# Patient Record
Sex: Female | Born: 1935 | Race: White | Hispanic: No | Marital: Married | Smoking: Former smoker
Health system: Southern US, Community
[De-identification: ages and names within clinical notes are randomized; demographics above are authoritative.]

## PROBLEM LIST (undated history)

## (undated) DIAGNOSIS — C786 Secondary malignant neoplasm of retroperitoneum and peritoneum: Principal | ICD-10-CM

## (undated) DIAGNOSIS — E119 Type 2 diabetes mellitus without complications: Secondary | ICD-10-CM

## (undated) DIAGNOSIS — C801 Malignant (primary) neoplasm, unspecified: Principal | ICD-10-CM

## (undated) DIAGNOSIS — M549 Dorsalgia, unspecified: Secondary | ICD-10-CM

## (undated) DIAGNOSIS — C569 Malignant neoplasm of unspecified ovary: Secondary | ICD-10-CM

## (undated) DIAGNOSIS — F329 Major depressive disorder, single episode, unspecified: Secondary | ICD-10-CM

## (undated) DIAGNOSIS — D649 Anemia, unspecified: Secondary | ICD-10-CM

## (undated) DIAGNOSIS — K219 Gastro-esophageal reflux disease without esophagitis: Secondary | ICD-10-CM

## (undated) DIAGNOSIS — G8929 Other chronic pain: Secondary | ICD-10-CM

## (undated) DIAGNOSIS — I499 Cardiac arrhythmia, unspecified: Secondary | ICD-10-CM

## (undated) DIAGNOSIS — F32A Depression, unspecified: Secondary | ICD-10-CM

## (undated) HISTORY — PX: CHOLECYSTECTOMY: SHX55

## (undated) HISTORY — DX: Anemia, unspecified: D64.9

## (undated) HISTORY — PX: TUBAL LIGATION: SHX77

## (undated) HISTORY — PX: APPENDECTOMY: SHX54

## (undated) HISTORY — DX: Other chronic pain: G89.29

## (undated) HISTORY — DX: Gastro-esophageal reflux disease without esophagitis: K21.9

## (undated) HISTORY — DX: Malignant neoplasm of unspecified ovary: C56.9

## (undated) HISTORY — PX: HEEL SPUR SURGERY: SHX665

## (undated) HISTORY — DX: Dorsalgia, unspecified: M54.9

## (undated) HISTORY — DX: Type 2 diabetes mellitus without complications: E11.9

## (undated) HISTORY — DX: Secondary malignant neoplasm of retroperitoneum and peritoneum: C78.6

## (undated) HISTORY — DX: Major depressive disorder, single episode, unspecified: F32.9

## (undated) HISTORY — DX: Malignant (primary) neoplasm, unspecified: C80.1

## (undated) HISTORY — PX: ABDOMINAL HYSTERECTOMY: SHX81

## (undated) HISTORY — DX: Cardiac arrhythmia, unspecified: I49.9

## (undated) HISTORY — DX: Depression, unspecified: F32.A

---

## 2003-09-27 ENCOUNTER — Other Ambulatory Visit: Payer: Self-pay

## 2004-07-08 ENCOUNTER — Ambulatory Visit: Payer: Self-pay | Admitting: Family Medicine

## 2004-07-22 ENCOUNTER — Ambulatory Visit: Payer: Self-pay

## 2005-03-23 ENCOUNTER — Ambulatory Visit: Payer: Self-pay

## 2005-05-03 ENCOUNTER — Ambulatory Visit: Payer: Self-pay

## 2005-05-24 ENCOUNTER — Emergency Department: Payer: Self-pay | Admitting: Emergency Medicine

## 2005-09-29 ENCOUNTER — Ambulatory Visit: Payer: Self-pay | Admitting: Unknown Physician Specialty

## 2006-01-10 ENCOUNTER — Other Ambulatory Visit: Payer: Self-pay

## 2006-01-10 ENCOUNTER — Ambulatory Visit: Payer: Self-pay | Admitting: Gastroenterology

## 2006-01-17 ENCOUNTER — Ambulatory Visit: Payer: Self-pay | Admitting: Gastroenterology

## 2006-11-29 ENCOUNTER — Ambulatory Visit: Payer: Self-pay | Admitting: Unknown Physician Specialty

## 2007-01-17 ENCOUNTER — Emergency Department: Payer: Self-pay | Admitting: Emergency Medicine

## 2007-01-17 ENCOUNTER — Other Ambulatory Visit: Payer: Self-pay

## 2007-01-31 ENCOUNTER — Ambulatory Visit: Payer: Self-pay | Admitting: Internal Medicine

## 2007-04-02 ENCOUNTER — Ambulatory Visit: Payer: Self-pay | Admitting: Unknown Physician Specialty

## 2007-11-29 ENCOUNTER — Ambulatory Visit: Payer: Self-pay | Admitting: Gastroenterology

## 2008-01-31 ENCOUNTER — Ambulatory Visit: Payer: Self-pay | Admitting: Unknown Physician Specialty

## 2008-06-12 ENCOUNTER — Ambulatory Visit: Payer: Self-pay | Admitting: Unknown Physician Specialty

## 2009-03-10 ENCOUNTER — Ambulatory Visit: Payer: Self-pay | Admitting: Unknown Physician Specialty

## 2009-05-13 ENCOUNTER — Ambulatory Visit: Payer: Self-pay | Admitting: Obstetrics and Gynecology

## 2009-05-18 ENCOUNTER — Ambulatory Visit: Payer: Self-pay | Admitting: Obstetrics and Gynecology

## 2010-12-14 ENCOUNTER — Ambulatory Visit: Payer: Self-pay | Admitting: Unknown Physician Specialty

## 2011-10-11 ENCOUNTER — Emergency Department: Payer: Self-pay | Admitting: Unknown Physician Specialty

## 2011-10-11 LAB — CBC
HGB: 12.2 g/dL (ref 12.0–16.0)
MCH: 32 pg (ref 26.0–34.0)
MCHC: 33.5 g/dL (ref 32.0–36.0)
MCV: 96 fL (ref 80–100)
Platelet: 262 10*3/uL (ref 150–440)
RDW: 12.6 % (ref 11.5–14.5)

## 2011-10-11 LAB — COMPREHENSIVE METABOLIC PANEL
Alkaline Phosphatase: 47 U/L — ABNORMAL LOW (ref 50–136)
Anion Gap: 11 (ref 7–16)
Bilirubin,Total: 0.9 mg/dL (ref 0.2–1.0)
Calcium, Total: 9.8 mg/dL (ref 8.5–10.1)
Chloride: 100 mmol/L (ref 98–107)
Co2: 29 mmol/L (ref 21–32)
Creatinine: 1.76 mg/dL — ABNORMAL HIGH (ref 0.60–1.30)
EGFR (African American): 36 — ABNORMAL LOW
Potassium: 4 mmol/L (ref 3.5–5.1)
Sodium: 140 mmol/L (ref 136–145)
Total Protein: 7.6 g/dL (ref 6.4–8.2)

## 2011-10-11 LAB — URINALYSIS, COMPLETE
Bacteria: NONE SEEN
Bilirubin,UR: NEGATIVE
Glucose,UR: NEGATIVE mg/dL (ref 0–75)
Ketone: NEGATIVE
Leukocyte Esterase: NEGATIVE
Ph: 5 (ref 4.5–8.0)
Protein: NEGATIVE
RBC,UR: 1 /HPF (ref 0–5)
Specific Gravity: 1.011 (ref 1.003–1.030)
Squamous Epithelial: 2

## 2012-03-20 ENCOUNTER — Emergency Department: Payer: Self-pay | Admitting: Emergency Medicine

## 2012-03-20 LAB — COMPREHENSIVE METABOLIC PANEL
Albumin: 4.3 g/dL (ref 3.4–5.0)
Anion Gap: 8 (ref 7–16)
BUN: 27 mg/dL — ABNORMAL HIGH (ref 7–18)
Bilirubin,Total: 0.7 mg/dL (ref 0.2–1.0)
Calcium, Total: 9.8 mg/dL (ref 8.5–10.1)
Chloride: 103 mmol/L (ref 98–107)
Creatinine: 1.74 mg/dL — ABNORMAL HIGH (ref 0.60–1.30)
EGFR (African American): 33 — ABNORMAL LOW
EGFR (Non-African Amer.): 29 — ABNORMAL LOW
Glucose: 70 mg/dL (ref 65–99)
Osmolality: 283 (ref 275–301)
Sodium: 140 mmol/L (ref 136–145)
Total Protein: 7.9 g/dL (ref 6.4–8.2)

## 2012-03-20 LAB — URINALYSIS, COMPLETE
Bilirubin,UR: NEGATIVE
Blood: NEGATIVE
Glucose,UR: NEGATIVE mg/dL (ref 0–75)
Leukocyte Esterase: NEGATIVE
Specific Gravity: 1.021 (ref 1.003–1.030)
WBC UR: 1 /HPF (ref 0–5)

## 2012-03-20 LAB — CBC
HCT: 34.8 % — ABNORMAL LOW (ref 35.0–47.0)
HGB: 12 g/dL (ref 12.0–16.0)
MCH: 32.8 pg (ref 26.0–34.0)
MCV: 95 fL (ref 80–100)
Platelet: 269 10*3/uL (ref 150–440)
RBC: 3.67 10*6/uL — ABNORMAL LOW (ref 3.80–5.20)
WBC: 7 10*3/uL (ref 3.6–11.0)

## 2012-03-22 ENCOUNTER — Emergency Department: Payer: Self-pay | Admitting: Emergency Medicine

## 2012-06-05 ENCOUNTER — Ambulatory Visit: Payer: Self-pay

## 2012-06-21 ENCOUNTER — Ambulatory Visit: Payer: Self-pay | Admitting: Gastroenterology

## 2012-11-13 ENCOUNTER — Ambulatory Visit: Payer: Self-pay | Admitting: Ophthalmology

## 2012-11-26 ENCOUNTER — Ambulatory Visit: Payer: Self-pay | Admitting: Ophthalmology

## 2013-01-04 ENCOUNTER — Ambulatory Visit: Payer: Self-pay | Admitting: Gastroenterology

## 2013-01-22 ENCOUNTER — Other Ambulatory Visit: Payer: Self-pay | Admitting: Gastroenterology

## 2013-01-22 DIAGNOSIS — K469 Unspecified abdominal hernia without obstruction or gangrene: Secondary | ICD-10-CM

## 2013-01-23 ENCOUNTER — Ambulatory Visit: Payer: Self-pay | Admitting: Gastroenterology

## 2013-01-29 ENCOUNTER — Other Ambulatory Visit: Payer: Self-pay

## 2013-04-30 ENCOUNTER — Ambulatory Visit: Payer: Self-pay | Admitting: Oncology

## 2013-04-30 LAB — COMPREHENSIVE METABOLIC PANEL
Alkaline Phosphatase: 119 U/L (ref 50–136)
Bilirubin,Total: 0.5 mg/dL (ref 0.2–1.0)
Co2: 30 mmol/L (ref 21–32)
EGFR (African American): 34 — ABNORMAL LOW
Glucose: 82 mg/dL (ref 65–99)
Potassium: 4 mmol/L (ref 3.5–5.1)
SGOT(AST): 21 U/L (ref 15–37)
SGPT (ALT): 16 U/L (ref 12–78)
Sodium: 139 mmol/L (ref 136–145)
Total Protein: 7.8 g/dL (ref 6.4–8.2)

## 2013-04-30 LAB — CBC CANCER CENTER
Eosinophil #: 0.2 x10 3/mm (ref 0.0–0.7)
Eosinophil %: 3 %
HGB: 10.8 g/dL — ABNORMAL LOW (ref 12.0–16.0)
Lymphocyte %: 14.2 %
MCV: 93 fL (ref 80–100)
Monocyte #: 0.5 x10 3/mm (ref 0.2–0.9)
Neutrophil %: 75.9 %
Platelet: 284 x10 3/mm (ref 150–440)
RBC: 3.32 10*6/uL — ABNORMAL LOW (ref 3.80–5.20)
WBC: 8.1 x10 3/mm (ref 3.6–11.0)

## 2013-05-01 ENCOUNTER — Ambulatory Visit: Payer: Self-pay | Admitting: Gynecologic Oncology

## 2013-05-01 LAB — BASIC METABOLIC PANEL
Anion Gap: 4 — ABNORMAL LOW (ref 7–16)
Chloride: 108 mmol/L — ABNORMAL HIGH (ref 98–107)
Co2: 28 mmol/L (ref 21–32)
Creatinine: 1.84 mg/dL — ABNORMAL HIGH (ref 0.60–1.30)
EGFR (African American): 31 — ABNORMAL LOW
EGFR (Non-African Amer.): 27 — ABNORMAL LOW
Glucose: 118 mg/dL — ABNORMAL HIGH (ref 65–99)
Osmolality: 288 (ref 275–301)
Potassium: 4.3 mmol/L (ref 3.5–5.1)

## 2013-05-01 LAB — CBC
HGB: 9.9 g/dL — ABNORMAL LOW (ref 12.0–16.0)
MCH: 32.5 pg (ref 26.0–34.0)
MCHC: 35 g/dL (ref 32.0–36.0)
Platelet: 260 10*3/uL (ref 150–440)
RBC: 3.06 10*6/uL — ABNORMAL LOW (ref 3.80–5.20)
WBC: 6.7 10*3/uL (ref 3.6–11.0)

## 2013-05-02 ENCOUNTER — Ambulatory Visit: Payer: Self-pay | Admitting: Anesthesiology

## 2013-05-02 ENCOUNTER — Ambulatory Visit: Payer: Self-pay | Admitting: Gastroenterology

## 2013-05-07 ENCOUNTER — Inpatient Hospital Stay: Payer: Self-pay | Admitting: Surgery

## 2013-05-08 LAB — PATHOLOGY REPORT

## 2013-05-13 LAB — COMPREHENSIVE METABOLIC PANEL
Alkaline Phosphatase: 98 U/L (ref 50–136)
Anion Gap: 9 (ref 7–16)
BUN: 20 mg/dL — ABNORMAL HIGH (ref 7–18)
Bilirubin,Total: 0.4 mg/dL (ref 0.2–1.0)
Chloride: 105 mmol/L (ref 98–107)
Creatinine: 1.52 mg/dL — ABNORMAL HIGH (ref 0.60–1.30)
EGFR (African American): 39 — ABNORMAL LOW
EGFR (Non-African Amer.): 33 — ABNORMAL LOW
Osmolality: 286 (ref 275–301)
Potassium: 3.7 mmol/L (ref 3.5–5.1)
SGPT (ALT): 15 U/L (ref 12–78)
Total Protein: 6.6 g/dL (ref 6.4–8.2)

## 2013-05-13 LAB — CBC CANCER CENTER
Basophil #: 0.1 x10 3/mm (ref 0.0–0.1)
Basophil %: 1 %
Eosinophil #: 0.8 x10 3/mm — ABNORMAL HIGH (ref 0.0–0.7)
Eosinophil %: 10.8 %
HCT: 26.7 % — ABNORMAL LOW (ref 35.0–47.0)
HGB: 9.3 g/dL — ABNORMAL LOW (ref 12.0–16.0)
Lymphocyte #: 0.9 x10 3/mm — ABNORMAL LOW (ref 1.0–3.6)
MCH: 33 pg (ref 26.0–34.0)
MCV: 94 fL (ref 80–100)
Monocyte #: 0.6 x10 3/mm (ref 0.2–0.9)
Neutrophil %: 69.1 %
Platelet: 326 x10 3/mm (ref 150–440)
RDW: 12.6 % (ref 11.5–14.5)

## 2013-05-20 LAB — COMPREHENSIVE METABOLIC PANEL
Albumin: 3 g/dL — ABNORMAL LOW (ref 3.4–5.0)
Alkaline Phosphatase: 163 U/L — ABNORMAL HIGH (ref 50–136)
Anion Gap: 6 — ABNORMAL LOW (ref 7–16)
BUN: 25 mg/dL — ABNORMAL HIGH (ref 7–18)
Bilirubin,Total: 0.4 mg/dL (ref 0.2–1.0)
Co2: 26 mmol/L (ref 21–32)
Creatinine: 1.62 mg/dL — ABNORMAL HIGH (ref 0.60–1.30)
Potassium: 3.9 mmol/L (ref 3.5–5.1)
SGOT(AST): 29 U/L (ref 15–37)
SGPT (ALT): 24 U/L (ref 12–78)

## 2013-05-20 LAB — CBC CANCER CENTER
Basophil #: 0 x10 3/mm (ref 0.0–0.1)
Eosinophil %: 2.8 %
HCT: 29 % — ABNORMAL LOW (ref 35.0–47.0)
Lymphocyte #: 0.9 x10 3/mm — ABNORMAL LOW (ref 1.0–3.6)
Lymphocyte %: 6.6 %
MCH: 31.9 pg (ref 26.0–34.0)
Monocyte #: 1 x10 3/mm — ABNORMAL HIGH (ref 0.2–0.9)
Monocyte %: 7.3 %
Neutrophil #: 11.4 x10 3/mm — ABNORMAL HIGH (ref 1.4–6.5)
Platelet: 297 x10 3/mm (ref 150–440)
RBC: 3.1 10*6/uL — ABNORMAL LOW (ref 3.80–5.20)
RDW: 12.6 % (ref 11.5–14.5)

## 2013-05-20 LAB — MAGNESIUM: Magnesium: 0.9 mg/dL — ABNORMAL LOW

## 2013-05-22 LAB — BASIC METABOLIC PANEL
BUN: 24 mg/dL — ABNORMAL HIGH (ref 7–18)
Calcium, Total: 9 mg/dL (ref 8.5–10.1)
Chloride: 103 mmol/L (ref 98–107)
Creatinine: 1.72 mg/dL — ABNORMAL HIGH (ref 0.60–1.30)
EGFR (Non-African Amer.): 29 — ABNORMAL LOW
Glucose: 136 mg/dL — ABNORMAL HIGH (ref 65–99)
Osmolality: 280 (ref 275–301)

## 2013-05-22 LAB — CBC CANCER CENTER
Eosinophil #: 0.3 x10 3/mm (ref 0.0–0.7)
Eosinophil %: 1.2 %
HCT: 25.4 % — ABNORMAL LOW (ref 35.0–47.0)
HGB: 8.7 g/dL — ABNORMAL LOW (ref 12.0–16.0)
Lymphocyte #: 0.9 x10 3/mm — ABNORMAL LOW (ref 1.0–3.6)
Lymphocyte %: 3.7 %
MCH: 32.2 pg (ref 26.0–34.0)
MCHC: 34.3 g/dL (ref 32.0–36.0)
MCV: 94 fL (ref 80–100)
Monocyte #: 1 x10 3/mm — ABNORMAL HIGH (ref 0.2–0.9)
Neutrophil #: 22.4 x10 3/mm — ABNORMAL HIGH (ref 1.4–6.5)
Neutrophil %: 90.8 %
RBC: 2.7 10*6/uL — ABNORMAL LOW (ref 3.80–5.20)
RDW: 12.8 % (ref 11.5–14.5)

## 2013-05-22 LAB — MAGNESIUM: Magnesium: 1.7 mg/dL — ABNORMAL LOW

## 2013-05-27 ENCOUNTER — Ambulatory Visit: Payer: Self-pay | Admitting: Oncology

## 2013-05-28 LAB — CBC CANCER CENTER
Basophil %: 0.3 %
Eosinophil #: 0.1 x10 3/mm (ref 0.0–0.7)
Eosinophil %: 0.6 %
HGB: 8 g/dL — ABNORMAL LOW (ref 12.0–16.0)
MCH: 31.7 pg (ref 26.0–34.0)
MCHC: 33.5 g/dL (ref 32.0–36.0)
MCV: 95 fL (ref 80–100)
Monocyte %: 5.2 %
Neutrophil #: 14.2 x10 3/mm — ABNORMAL HIGH (ref 1.4–6.5)
Neutrophil %: 87.3 %
Platelet: 330 x10 3/mm (ref 150–440)
RBC: 2.51 10*6/uL — ABNORMAL LOW (ref 3.80–5.20)

## 2013-05-28 LAB — OCCULT BLOOD X 1 CARD TO LAB, STOOL: Occult Blood, Feces: NEGATIVE

## 2013-06-03 LAB — COMPREHENSIVE METABOLIC PANEL
Albumin: 2.8 g/dL — ABNORMAL LOW (ref 3.4–5.0)
Alkaline Phosphatase: 125 U/L (ref 50–136)
Anion Gap: 8 (ref 7–16)
BUN: 23 mg/dL — ABNORMAL HIGH (ref 7–18)
Bilirubin,Total: <0.1 mg/dL — ABNORMAL LOW (ref 0.2–1.0)
Calcium, Total: 8.9 mg/dL (ref 8.5–10.1)
Chloride: 106 mmol/L (ref 98–107)
Creatinine: 1.5 mg/dL — ABNORMAL HIGH (ref 0.60–1.30)
EGFR (African American): 39 — ABNORMAL LOW
EGFR (Non-African Amer.): 34 — ABNORMAL LOW
Glucose: 103 mg/dL — ABNORMAL HIGH (ref 65–99)
SGOT(AST): 17 U/L (ref 15–37)
Sodium: 142 mmol/L (ref 136–145)
Total Protein: 6.2 g/dL — ABNORMAL LOW (ref 6.4–8.2)

## 2013-06-03 LAB — CBC CANCER CENTER
Basophil %: 1 %
Eosinophil #: 0.3 x10 3/mm (ref 0.0–0.7)
HCT: 22.9 % — ABNORMAL LOW (ref 35.0–47.0)
HGB: 7.8 g/dL — ABNORMAL LOW (ref 12.0–16.0)
Lymphocyte #: 1.2 x10 3/mm (ref 1.0–3.6)
Lymphocyte %: 15.5 %
Monocyte #: 0.6 x10 3/mm (ref 0.2–0.9)
Monocyte %: 8 %
Neutrophil #: 5.5 x10 3/mm (ref 1.4–6.5)
Platelet: 308 x10 3/mm (ref 150–440)
RBC: 2.43 10*6/uL — ABNORMAL LOW (ref 3.80–5.20)
WBC: 7.7 x10 3/mm (ref 3.6–11.0)

## 2013-06-10 LAB — CBC CANCER CENTER
Basophil #: 0 x10 3/mm (ref 0.0–0.1)
Basophil %: 0.6 %
Eosinophil %: 7.6 %
Lymphocyte %: 17.3 %
MCH: 32.2 pg (ref 26.0–34.0)
MCHC: 34 g/dL (ref 32.0–36.0)
Monocyte #: 0.1 x10 3/mm — ABNORMAL LOW (ref 0.2–0.9)
Monocyte %: 2.7 %
Neutrophil #: 2.3 x10 3/mm (ref 1.4–6.5)
Neutrophil %: 71.8 %
Platelet: 186 x10 3/mm (ref 150–440)
WBC: 3.2 x10 3/mm — ABNORMAL LOW (ref 3.6–11.0)

## 2013-06-11 LAB — IRON AND TIBC
Iron Saturation: 24 %
Iron: 58 ug/dL (ref 50–170)

## 2013-06-17 LAB — CBC CANCER CENTER
Basophil #: 0 x10 3/mm (ref 0.0–0.1)
Eosinophil #: 0.1 x10 3/mm (ref 0.0–0.7)
HCT: 30.4 % — ABNORMAL LOW (ref 35.0–47.0)
HGB: 10.2 g/dL — ABNORMAL LOW (ref 12.0–16.0)
Lymphocyte #: 0.9 x10 3/mm — ABNORMAL LOW (ref 1.0–3.6)
Lymphocyte %: 44.5 %
MCH: 31 pg (ref 26.0–34.0)
MCHC: 33.4 g/dL (ref 32.0–36.0)
MCV: 93 fL (ref 80–100)
Neutrophil %: 35.2 %
Platelet: 138 x10 3/mm — ABNORMAL LOW (ref 150–440)
RDW: 14.9 % — ABNORMAL HIGH (ref 11.5–14.5)
WBC: 2 x10 3/mm — CL (ref 3.6–11.0)

## 2013-06-18 LAB — CA 125: CA 125: 129.7 U/mL — ABNORMAL HIGH (ref 0.0–34.0)

## 2013-06-24 LAB — COMPREHENSIVE METABOLIC PANEL
Albumin: 3.6 g/dL (ref 3.4–5.0)
Alkaline Phosphatase: 106 U/L (ref 50–136)
Anion Gap: 6 — ABNORMAL LOW (ref 7–16)
Bilirubin,Total: 0.4 mg/dL (ref 0.2–1.0)
Chloride: 105 mmol/L (ref 98–107)
Co2: 29 mmol/L (ref 21–32)
Creatinine: 2.51 mg/dL — ABNORMAL HIGH (ref 0.60–1.30)
EGFR (African American): 21 — ABNORMAL LOW
SGOT(AST): 16 U/L (ref 15–37)
Sodium: 140 mmol/L (ref 136–145)
Total Protein: 6.8 g/dL (ref 6.4–8.2)

## 2013-06-24 LAB — CBC CANCER CENTER
Basophil %: 0.6 %
Eosinophil #: 0.1 x10 3/mm (ref 0.0–0.7)
Eosinophil %: 1.6 %
HGB: 9.4 g/dL — ABNORMAL LOW (ref 12.0–16.0)
Lymphocyte %: 24.3 %
MCH: 31.8 pg (ref 26.0–34.0)
Neutrophil %: 63.5 %
Platelet: 90 x10 3/mm — ABNORMAL LOW (ref 150–440)
RBC: 2.95 10*6/uL — ABNORMAL LOW (ref 3.80–5.20)
RDW: 15.7 % — ABNORMAL HIGH (ref 11.5–14.5)

## 2013-06-26 ENCOUNTER — Ambulatory Visit: Payer: Self-pay | Admitting: Oncology

## 2013-07-01 LAB — CBC CANCER CENTER
Basophil #: 0 x10 3/mm (ref 0.0–0.1)
Eosinophil #: 0 x10 3/mm (ref 0.0–0.7)
Eosinophil %: 2.1 %
HCT: 29.2 % — ABNORMAL LOW (ref 35.0–47.0)
HGB: 10.1 g/dL — ABNORMAL LOW (ref 12.0–16.0)
Lymphocyte #: 0.9 x10 3/mm — ABNORMAL LOW (ref 1.0–3.6)
Lymphocyte %: 43.3 %
MCH: 31.6 pg (ref 26.0–34.0)
Monocyte %: 3.7 %
Neutrophil %: 49.7 %
Platelet: 173 x10 3/mm (ref 150–440)
WBC: 2.1 x10 3/mm — ABNORMAL LOW (ref 3.6–11.0)

## 2013-07-08 LAB — CBC CANCER CENTER
Basophil %: 1.5 %
Eosinophil #: 0 x10 3/mm (ref 0.0–0.7)
HCT: 24.2 % — ABNORMAL LOW (ref 35.0–47.0)
Lymphocyte #: 0.9 x10 3/mm — ABNORMAL LOW (ref 1.0–3.6)
MCH: 32.3 pg (ref 26.0–34.0)
MCHC: 34.7 g/dL (ref 32.0–36.0)
MCV: 93 fL (ref 80–100)
Monocyte #: 0.4 x10 3/mm (ref 0.2–0.9)
Monocyte %: 22.2 %
Neutrophil %: 23.1 %
Platelet: 135 x10 3/mm — ABNORMAL LOW (ref 150–440)
WBC: 1.8 x10 3/mm — CL (ref 3.6–11.0)

## 2013-07-09 LAB — CA 125: CA 125: 49.9 U/mL — ABNORMAL HIGH (ref 0.0–34.0)

## 2013-07-15 LAB — COMPREHENSIVE METABOLIC PANEL
Albumin: 3.3 g/dL — ABNORMAL LOW (ref 3.4–5.0)
Anion Gap: 8 (ref 7–16)
Bilirubin,Total: 0.3 mg/dL (ref 0.2–1.0)
Calcium, Total: 8.5 mg/dL (ref 8.5–10.1)
Chloride: 106 mmol/L (ref 98–107)
Creatinine: 1.53 mg/dL — ABNORMAL HIGH (ref 0.60–1.30)
EGFR (African American): 38 — ABNORMAL LOW
EGFR (Non-African Amer.): 33 — ABNORMAL LOW
Glucose: 123 mg/dL — ABNORMAL HIGH (ref 65–99)
Osmolality: 289 (ref 275–301)
Potassium: 3.5 mmol/L (ref 3.5–5.1)
SGOT(AST): 20 U/L (ref 15–37)
SGPT (ALT): 18 U/L (ref 12–78)
Sodium: 143 mmol/L (ref 136–145)
Total Protein: 6.6 g/dL (ref 6.4–8.2)

## 2013-07-15 LAB — CBC CANCER CENTER
Basophil %: 1.1 %
Eosinophil #: 0.1 x10 3/mm (ref 0.0–0.7)
Eosinophil %: 1.3 %
Lymphocyte #: 1.2 x10 3/mm (ref 1.0–3.6)
Lymphocyte %: 31.9 %
MCHC: 34.8 g/dL (ref 32.0–36.0)
MCV: 95 fL (ref 80–100)
Monocyte %: 15.1 %
Neutrophil #: 2 x10 3/mm (ref 1.4–6.5)
RBC: 2.78 10*6/uL — ABNORMAL LOW (ref 3.80–5.20)
RDW: 17.4 % — ABNORMAL HIGH (ref 11.5–14.5)

## 2013-07-15 LAB — URINALYSIS, COMPLETE
Blood: NEGATIVE
Glucose,UR: NEGATIVE mg/dL (ref 0–75)
Nitrite: POSITIVE
Ph: 6 (ref 4.5–8.0)
Protein: NEGATIVE
RBC,UR: 1 /HPF (ref 0–5)
Squamous Epithelial: 1

## 2013-07-17 LAB — URINE CULTURE

## 2013-07-19 LAB — COMPREHENSIVE METABOLIC PANEL
Albumin: 3.4 g/dL (ref 3.4–5.0)
Alkaline Phosphatase: 83 U/L (ref 50–136)
Bilirubin,Total: 1 mg/dL (ref 0.2–1.0)
Osmolality: 290 (ref 275–301)
Potassium: 3.8 mmol/L (ref 3.5–5.1)
Sodium: 142 mmol/L (ref 136–145)
Total Protein: 6.8 g/dL (ref 6.4–8.2)

## 2013-07-19 LAB — CBC CANCER CENTER
Basophil #: 0 x10 3/mm (ref 0.0–0.1)
Basophil %: 0.7 %
Eosinophil #: 0.1 x10 3/mm (ref 0.0–0.7)
HGB: 9.1 g/dL — ABNORMAL LOW (ref 12.0–16.0)
Lymphocyte #: 0.7 x10 3/mm — ABNORMAL LOW (ref 1.0–3.6)
Lymphocyte %: 16.3 %
MCH: 32.1 pg (ref 26.0–34.0)
MCHC: 34.2 g/dL (ref 32.0–36.0)
MCV: 94 fL (ref 80–100)
Monocyte #: 0.1 x10 3/mm — ABNORMAL LOW (ref 0.2–0.9)
Monocyte %: 1.2 %
Neutrophil #: 3.6 x10 3/mm (ref 1.4–6.5)
Neutrophil %: 78.8 %
Platelet: 78 x10 3/mm — ABNORMAL LOW (ref 150–440)
RBC: 2.83 10*6/uL — ABNORMAL LOW (ref 3.80–5.20)
WBC: 4.6 x10 3/mm (ref 3.6–11.0)

## 2013-07-22 LAB — CBC CANCER CENTER
Basophil %: 1.4 %
Eosinophil %: 5.7 %
HGB: 10.1 g/dL — ABNORMAL LOW (ref 12.0–16.0)
Lymphocyte #: 1.2 x10 3/mm (ref 1.0–3.6)
Lymphocyte %: 30.7 %
MCHC: 34.4 g/dL (ref 32.0–36.0)
MCV: 94 fL (ref 80–100)
Monocyte #: 0 x10 3/mm — ABNORMAL LOW (ref 0.2–0.9)
Monocyte %: 1.1 %
Neutrophil #: 2.4 x10 3/mm (ref 1.4–6.5)
Neutrophil %: 61.1 %
Platelet: 83 x10 3/mm — ABNORMAL LOW (ref 150–440)
RBC: 3.14 10*6/uL — ABNORMAL LOW (ref 3.80–5.20)
WBC: 4 x10 3/mm (ref 3.6–11.0)

## 2013-07-27 ENCOUNTER — Ambulatory Visit: Payer: Self-pay | Admitting: Oncology

## 2013-07-29 LAB — CBC CANCER CENTER
Basophil #: 0 x10 3/mm (ref 0.0–0.1)
Basophil %: 0.8 %
Eosinophil %: 6.8 %
HCT: 24.6 % — ABNORMAL LOW (ref 35.0–47.0)
HGB: 8.5 g/dL — ABNORMAL LOW (ref 12.0–16.0)
Lymphocyte #: 1.1 x10 3/mm (ref 1.0–3.6)
MCHC: 34.3 g/dL (ref 32.0–36.0)
MCV: 95 fL (ref 80–100)
Monocyte #: 0.3 x10 3/mm (ref 0.2–0.9)
Neutrophil #: 0.4 x10 3/mm — ABNORMAL LOW (ref 1.4–6.5)
Neutrophil %: 18.9 %
Platelet: 91 x10 3/mm — ABNORMAL LOW (ref 150–440)
RDW: 17.7 % — ABNORMAL HIGH (ref 11.5–14.5)

## 2013-08-05 LAB — IRON AND TIBC
Iron Bind.Cap.(Total): 269 ug/dL (ref 250–450)
Iron Saturation: 26 %

## 2013-08-05 LAB — CBC CANCER CENTER
Eosinophil #: 0.1 x10 3/mm (ref 0.0–0.7)
Eosinophil %: 2.2 %
HCT: 22.4 % — ABNORMAL LOW (ref 35.0–47.0)
HGB: 7.7 g/dL — ABNORMAL LOW (ref 12.0–16.0)
MCH: 33 pg (ref 26.0–34.0)
MCHC: 34.1 g/dL (ref 32.0–36.0)
MCV: 97 fL (ref 80–100)
Monocyte %: 11.9 %
Neutrophil #: 1.3 x10 3/mm — ABNORMAL LOW (ref 1.4–6.5)
RDW: 18.8 % — ABNORMAL HIGH (ref 11.5–14.5)
WBC: 2.5 x10 3/mm — ABNORMAL LOW (ref 3.6–11.0)

## 2013-08-05 LAB — COMPREHENSIVE METABOLIC PANEL
Alkaline Phosphatase: 87 U/L (ref 50–136)
Anion Gap: 6 — ABNORMAL LOW (ref 7–16)
BUN: 26 mg/dL — ABNORMAL HIGH (ref 7–18)
Calcium, Total: 9 mg/dL (ref 8.5–10.1)
Chloride: 108 mmol/L — ABNORMAL HIGH (ref 98–107)
Co2: 31 mmol/L (ref 21–32)
EGFR (Non-African Amer.): 30 — ABNORMAL LOW
Osmolality: 296 (ref 275–301)
Potassium: 3.7 mmol/L (ref 3.5–5.1)
SGOT(AST): 16 U/L (ref 15–37)
Sodium: 145 mmol/L (ref 136–145)
Total Protein: 6.4 g/dL (ref 6.4–8.2)

## 2013-08-05 LAB — FERRITIN: Ferritin (ARMC): 222 ng/mL (ref 8–388)

## 2013-08-06 LAB — CA 125: CA 125: 25.5 U/mL (ref 0.0–34.0)

## 2013-08-12 LAB — CBC CANCER CENTER
Basophil #: 0 x10 3/mm (ref 0.0–0.1)
Eosinophil %: 3.6 %
Lymphocyte #: 0.9 x10 3/mm — ABNORMAL LOW (ref 1.0–3.6)
Lymphocyte %: 42.7 %
MCHC: 33.2 g/dL (ref 32.0–36.0)
MCV: 93 fL (ref 80–100)
Monocyte #: 0.1 x10 3/mm — ABNORMAL LOW (ref 0.2–0.9)
Monocyte %: 3.2 %
Neutrophil #: 1 x10 3/mm — ABNORMAL LOW (ref 1.4–6.5)
Platelet: 158 x10 3/mm (ref 150–440)
RBC: 3.54 10*6/uL — ABNORMAL LOW (ref 3.80–5.20)
RDW: 19.3 % — ABNORMAL HIGH (ref 11.5–14.5)
WBC: 2.1 x10 3/mm — ABNORMAL LOW (ref 3.6–11.0)

## 2013-08-19 LAB — HEPATIC FUNCTION PANEL A (ARMC)
Alkaline Phosphatase: 91 U/L
Bilirubin, Direct: 0.1 mg/dL (ref 0.00–0.20)
SGOT(AST): 17 U/L (ref 15–37)
Total Protein: 7 g/dL (ref 6.4–8.2)

## 2013-08-19 LAB — CBC CANCER CENTER
Basophil #: 0 x10 3/mm (ref 0.0–0.1)
Basophil %: 1.3 %
HCT: 29.5 % — ABNORMAL LOW (ref 35.0–47.0)
HGB: 10 g/dL — ABNORMAL LOW (ref 12.0–16.0)
Lymphocyte #: 1.1 x10 3/mm (ref 1.0–3.6)
MCH: 31.6 pg (ref 26.0–34.0)
MCHC: 33.8 g/dL (ref 32.0–36.0)
MCV: 94 fL (ref 80–100)
Monocyte #: 0.4 x10 3/mm (ref 0.2–0.9)
Neutrophil #: 0.4 x10 3/mm — ABNORMAL LOW (ref 1.4–6.5)
Neutrophil %: 20 %
Platelet: 162 x10 3/mm (ref 150–440)
RBC: 3.15 10*6/uL — ABNORMAL LOW (ref 3.80–5.20)
RDW: 19.1 % — ABNORMAL HIGH (ref 11.5–14.5)

## 2013-08-19 LAB — BASIC METABOLIC PANEL
Creatinine: 1.6 mg/dL — ABNORMAL HIGH (ref 0.60–1.30)
Glucose: 105 mg/dL — ABNORMAL HIGH (ref 65–99)
Potassium: 3.8 mmol/L (ref 3.5–5.1)
Sodium: 143 mmol/L (ref 136–145)

## 2013-08-23 ENCOUNTER — Emergency Department: Payer: Self-pay | Admitting: Emergency Medicine

## 2013-08-26 ENCOUNTER — Ambulatory Visit: Payer: Self-pay | Admitting: Oncology

## 2013-08-26 LAB — COMPREHENSIVE METABOLIC PANEL
Albumin: 3.5 g/dL (ref 3.4–5.0)
Alkaline Phosphatase: 89 U/L
Bilirubin,Total: 0.3 mg/dL (ref 0.2–1.0)
Calcium, Total: 9.5 mg/dL (ref 8.5–10.1)
Co2: 28 mmol/L (ref 21–32)
EGFR (African American): 36 — ABNORMAL LOW
Osmolality: 291 (ref 275–301)
Potassium: 4.1 mmol/L (ref 3.5–5.1)
SGOT(AST): 16 U/L (ref 15–37)
Sodium: 143 mmol/L (ref 136–145)
Total Protein: 7 g/dL (ref 6.4–8.2)

## 2013-08-26 LAB — CBC CANCER CENTER
Basophil %: 0.9 %
Eosinophil #: 0 x10 3/mm (ref 0.0–0.7)
Eosinophil %: 1.2 %
HCT: 28.8 % — ABNORMAL LOW (ref 35.0–47.0)
HGB: 9.9 g/dL — ABNORMAL LOW (ref 12.0–16.0)
Lymphocyte #: 1.4 x10 3/mm (ref 1.0–3.6)
MCV: 94 fL (ref 80–100)
Monocyte %: 13.3 %
Platelet: 117 x10 3/mm — ABNORMAL LOW (ref 150–440)
RBC: 3.06 10*6/uL — ABNORMAL LOW (ref 3.80–5.20)
WBC: 4.2 x10 3/mm (ref 3.6–11.0)

## 2013-09-02 LAB — CBC CANCER CENTER
Eosinophil #: 0.1 x10 3/mm (ref 0.0–0.7)
Eosinophil %: 2 %
HCT: 27.3 % — ABNORMAL LOW (ref 35.0–47.0)
Lymphocyte #: 0.8 x10 3/mm — ABNORMAL LOW (ref 1.0–3.6)
Lymphocyte %: 28.5 %
Monocyte #: 0.1 x10 3/mm — ABNORMAL LOW (ref 0.2–0.9)
Neutrophil %: 66.4 %
Platelet: 65 x10 3/mm — ABNORMAL LOW (ref 150–440)
RBC: 2.89 10*6/uL — ABNORMAL LOW (ref 3.80–5.20)

## 2013-09-09 LAB — CBC CANCER CENTER
Eosinophil #: 0 x10 3/mm (ref 0.0–0.7)
HCT: 23.5 % — ABNORMAL LOW (ref 35.0–47.0)
Lymphocyte #: 0.9 x10 3/mm — ABNORMAL LOW (ref 1.0–3.6)
Lymphocyte %: 40.6 %
MCH: 32.7 pg (ref 26.0–34.0)
MCHC: 34.3 g/dL (ref 32.0–36.0)
MCV: 96 fL (ref 80–100)
Monocyte #: 0.2 x10 3/mm (ref 0.2–0.9)
Monocyte %: 9.6 %
Neutrophil %: 47.7 %
Platelet: 76 x10 3/mm — ABNORMAL LOW (ref 150–440)
RDW: 19.5 % — ABNORMAL HIGH (ref 11.5–14.5)

## 2013-09-16 LAB — CBC CANCER CENTER
HCT: 24.7 % — ABNORMAL LOW (ref 35.0–47.0)
HGB: 8.5 g/dL — ABNORMAL LOW (ref 12.0–16.0)
MCV: 98 fL (ref 80–100)
Monocyte %: 9.9 %
Neutrophil #: 1.9 x10 3/mm (ref 1.4–6.5)
RDW: 21.9 % — ABNORMAL HIGH (ref 11.5–14.5)

## 2013-09-26 ENCOUNTER — Ambulatory Visit: Payer: Self-pay | Admitting: Oncology

## 2013-10-03 ENCOUNTER — Ambulatory Visit: Payer: Self-pay | Admitting: Oncology

## 2013-10-08 LAB — CBC CANCER CENTER
BASOS ABS: 0 x10 3/mm (ref 0.0–0.1)
Basophil %: 1.1 %
EOS PCT: 4.3 %
Eosinophil #: 0.2 x10 3/mm (ref 0.0–0.7)
HCT: 25.2 % — AB (ref 35.0–47.0)
HGB: 8.5 g/dL — AB (ref 12.0–16.0)
LYMPHS ABS: 0.9 x10 3/mm — AB (ref 1.0–3.6)
LYMPHS PCT: 24.1 %
MCH: 34.3 pg — AB (ref 26.0–34.0)
MCHC: 33.5 g/dL (ref 32.0–36.0)
MCV: 102 fL — AB (ref 80–100)
MONO ABS: 0.3 x10 3/mm (ref 0.2–0.9)
MONOS PCT: 9 %
Neutrophil #: 2.2 x10 3/mm (ref 1.4–6.5)
Neutrophil %: 61.5 %
Platelet: 171 x10 3/mm (ref 150–440)
RBC: 2.46 10*6/uL — AB (ref 3.80–5.20)
RDW: 19.3 % — AB (ref 11.5–14.5)
WBC: 3.6 x10 3/mm (ref 3.6–11.0)

## 2013-10-08 LAB — COMPREHENSIVE METABOLIC PANEL
ANION GAP: 8 (ref 7–16)
Albumin: 3.7 g/dL (ref 3.4–5.0)
Alkaline Phosphatase: 80 U/L
BUN: 47 mg/dL — AB (ref 7–18)
Bilirubin,Total: 0.4 mg/dL (ref 0.2–1.0)
CALCIUM: 9.6 mg/dL (ref 8.5–10.1)
CO2: 27 mmol/L (ref 21–32)
Chloride: 107 mmol/L (ref 98–107)
Creatinine: 2.61 mg/dL — ABNORMAL HIGH (ref 0.60–1.30)
EGFR (African American): 20 — ABNORMAL LOW
GFR CALC NON AF AMER: 17 — AB
Glucose: 108 mg/dL — ABNORMAL HIGH (ref 65–99)
OSMOLALITY: 296 (ref 275–301)
POTASSIUM: 4.1 mmol/L (ref 3.5–5.1)
SGOT(AST): 14 U/L — ABNORMAL LOW (ref 15–37)
SGPT (ALT): 14 U/L (ref 12–78)
SODIUM: 142 mmol/L (ref 136–145)
TOTAL PROTEIN: 6.7 g/dL (ref 6.4–8.2)

## 2013-10-27 ENCOUNTER — Ambulatory Visit: Payer: Self-pay | Admitting: Oncology

## 2013-11-14 LAB — COMPREHENSIVE METABOLIC PANEL
ALK PHOS: 113 U/L
ANION GAP: 14 (ref 7–16)
Albumin: 4 g/dL (ref 3.4–5.0)
BILIRUBIN TOTAL: 0.2 mg/dL (ref 0.2–1.0)
BUN: 52 mg/dL — ABNORMAL HIGH (ref 7–18)
CHLORIDE: 105 mmol/L (ref 98–107)
CO2: 25 mmol/L (ref 21–32)
Calcium, Total: 8.6 mg/dL (ref 8.5–10.1)
Creatinine: 3.02 mg/dL — ABNORMAL HIGH (ref 0.60–1.30)
EGFR (African American): 17 — ABNORMAL LOW
GFR CALC NON AF AMER: 14 — AB
GLUCOSE: 99 mg/dL (ref 65–99)
Osmolality: 301 (ref 275–301)
POTASSIUM: 3.8 mmol/L (ref 3.5–5.1)
SGOT(AST): 27 U/L (ref 15–37)
SGPT (ALT): 21 U/L (ref 12–78)
Sodium: 144 mmol/L (ref 136–145)
Total Protein: 7.6 g/dL (ref 6.4–8.2)

## 2013-11-14 LAB — CBC CANCER CENTER
Basophil #: 0 x10 3/mm (ref 0.0–0.1)
Basophil %: 0.8 %
Eosinophil #: 0.5 x10 3/mm (ref 0.0–0.7)
Eosinophil %: 8.8 %
HCT: 28.3 % — AB (ref 35.0–47.0)
HGB: 9.4 g/dL — ABNORMAL LOW (ref 12.0–16.0)
LYMPHS PCT: 23.6 %
Lymphocyte #: 1.3 x10 3/mm (ref 1.0–3.6)
MCH: 34.3 pg — ABNORMAL HIGH (ref 26.0–34.0)
MCHC: 33.2 g/dL (ref 32.0–36.0)
MCV: 103 fL — ABNORMAL HIGH (ref 80–100)
MONOS PCT: 8.1 %
Monocyte #: 0.5 x10 3/mm (ref 0.2–0.9)
Neutrophil #: 3.3 x10 3/mm (ref 1.4–6.5)
Neutrophil %: 58.7 %
Platelet: 154 x10 3/mm (ref 150–440)
RBC: 2.73 10*6/uL — ABNORMAL LOW (ref 3.80–5.20)
RDW: 12.9 % (ref 11.5–14.5)
WBC: 5.7 x10 3/mm (ref 3.6–11.0)

## 2013-11-14 LAB — URINALYSIS, COMPLETE
Bilirubin,UR: NEGATIVE
GLUCOSE, UR: NEGATIVE mg/dL (ref 0–75)
Ketone: NEGATIVE
NITRITE: NEGATIVE
Ph: 5 (ref 4.5–8.0)
Protein: 30
RBC,UR: 12 /HPF (ref 0–5)
SPECIFIC GRAVITY: 1.019 (ref 1.003–1.030)

## 2013-11-15 LAB — CA 125: CA 125: 22.8 U/mL (ref 0.0–34.0)

## 2013-11-24 ENCOUNTER — Ambulatory Visit: Payer: Self-pay | Admitting: Oncology

## 2013-12-03 LAB — COMPREHENSIVE METABOLIC PANEL
ALK PHOS: 90 U/L
ALT: 14 U/L (ref 12–78)
AST: 15 U/L (ref 15–37)
Albumin: 3.5 g/dL (ref 3.4–5.0)
Anion Gap: 6 — ABNORMAL LOW (ref 7–16)
BUN: 29 mg/dL — AB (ref 7–18)
Bilirubin,Total: 0.4 mg/dL (ref 0.2–1.0)
CO2: 30 mmol/L (ref 21–32)
Calcium, Total: 9.6 mg/dL (ref 8.5–10.1)
Chloride: 105 mmol/L (ref 98–107)
Creatinine: 1.7 mg/dL — ABNORMAL HIGH (ref 0.60–1.30)
EGFR (African American): 34 — ABNORMAL LOW
EGFR (Non-African Amer.): 29 — ABNORMAL LOW
GLUCOSE: 106 mg/dL — AB (ref 65–99)
OSMOLALITY: 288 (ref 275–301)
POTASSIUM: 4.8 mmol/L (ref 3.5–5.1)
Sodium: 141 mmol/L (ref 136–145)
Total Protein: 7 g/dL (ref 6.4–8.2)

## 2013-12-03 LAB — CBC CANCER CENTER
Basophil #: 0 x10 3/mm (ref 0.0–0.1)
Basophil %: 0.9 %
EOS PCT: 15.4 %
Eosinophil #: 0.7 x10 3/mm (ref 0.0–0.7)
HCT: 28.3 % — AB (ref 35.0–47.0)
HGB: 9.4 g/dL — AB (ref 12.0–16.0)
LYMPHS ABS: 0.8 x10 3/mm — AB (ref 1.0–3.6)
Lymphocyte %: 17.8 %
MCH: 34.1 pg — ABNORMAL HIGH (ref 26.0–34.0)
MCHC: 33.2 g/dL (ref 32.0–36.0)
MCV: 103 fL — ABNORMAL HIGH (ref 80–100)
MONOS PCT: 6.7 %
Monocyte #: 0.3 x10 3/mm (ref 0.2–0.9)
NEUTROS ABS: 2.7 x10 3/mm (ref 1.4–6.5)
Neutrophil %: 59.2 %
Platelet: 149 x10 3/mm — ABNORMAL LOW (ref 150–440)
RBC: 2.76 10*6/uL — ABNORMAL LOW (ref 3.80–5.20)
RDW: 12.3 % (ref 11.5–14.5)
WBC: 4.6 x10 3/mm (ref 3.6–11.0)

## 2013-12-24 LAB — COMPREHENSIVE METABOLIC PANEL
ALBUMIN: 3.9 g/dL (ref 3.4–5.0)
ALT: 12 U/L (ref 12–78)
ANION GAP: 12 (ref 7–16)
Alkaline Phosphatase: 93 U/L
BILIRUBIN TOTAL: 0.6 mg/dL (ref 0.2–1.0)
BUN: 26 mg/dL — AB (ref 7–18)
Calcium, Total: 9.9 mg/dL (ref 8.5–10.1)
Chloride: 104 mmol/L (ref 98–107)
Co2: 28 mmol/L (ref 21–32)
Creatinine: 1.78 mg/dL — ABNORMAL HIGH (ref 0.60–1.30)
EGFR (Non-African Amer.): 27 — ABNORMAL LOW
GFR CALC AF AMER: 32 — AB
Glucose: 106 mg/dL — ABNORMAL HIGH (ref 65–99)
Osmolality: 292 (ref 275–301)
Potassium: 3.9 mmol/L (ref 3.5–5.1)
SGOT(AST): 14 U/L — ABNORMAL LOW (ref 15–37)
Sodium: 144 mmol/L (ref 136–145)
Total Protein: 7.5 g/dL (ref 6.4–8.2)

## 2013-12-24 LAB — CBC CANCER CENTER
BASOS ABS: 0 x10 3/mm (ref 0.0–0.1)
Basophil %: 0.8 %
Eosinophil #: 0.7 x10 3/mm (ref 0.0–0.7)
Eosinophil %: 14.5 %
HCT: 32.1 % — AB (ref 35.0–47.0)
HGB: 10.5 g/dL — ABNORMAL LOW (ref 12.0–16.0)
LYMPHS ABS: 1.1 x10 3/mm (ref 1.0–3.6)
Lymphocyte %: 23.3 %
MCH: 32.7 pg (ref 26.0–34.0)
MCHC: 32.6 g/dL (ref 32.0–36.0)
MCV: 100 fL (ref 80–100)
MONO ABS: 0.3 x10 3/mm (ref 0.2–0.9)
Monocyte %: 5.5 %
NEUTROS ABS: 2.7 x10 3/mm (ref 1.4–6.5)
Neutrophil %: 55.9 %
Platelet: 206 x10 3/mm (ref 150–440)
RBC: 3.21 10*6/uL — ABNORMAL LOW (ref 3.80–5.20)
RDW: 12.1 % (ref 11.5–14.5)
WBC: 4.8 x10 3/mm (ref 3.6–11.0)

## 2013-12-25 ENCOUNTER — Ambulatory Visit: Payer: Self-pay | Admitting: Oncology

## 2013-12-25 LAB — CA 125: CA 125: 32 U/mL (ref 0.0–34.0)

## 2014-01-09 LAB — URINALYSIS, COMPLETE
Bacteria: NONE SEEN
Bilirubin,UR: NEGATIVE
Blood: NEGATIVE
GLUCOSE, UR: NEGATIVE mg/dL (ref 0–75)
Hyaline Cast: 3
Ketone: NEGATIVE
Nitrite: NEGATIVE
PROTEIN: NEGATIVE
Ph: 5 (ref 4.5–8.0)
RBC,UR: NONE SEEN /HPF (ref 0–5)
Specific Gravity: 1.015 (ref 1.003–1.030)
WBC UR: <1 /HPF (ref 0–5)

## 2014-01-11 LAB — URINE CULTURE

## 2014-01-24 ENCOUNTER — Ambulatory Visit: Payer: Self-pay | Admitting: Oncology

## 2014-01-26 ENCOUNTER — Emergency Department: Payer: Self-pay | Admitting: Emergency Medicine

## 2014-02-24 ENCOUNTER — Ambulatory Visit: Payer: Self-pay | Admitting: Oncology

## 2014-03-03 LAB — CBC CANCER CENTER
BASOS PCT: 0.8 %
Basophil #: 0 x10 3/mm (ref 0.0–0.1)
EOS PCT: 16.3 %
Eosinophil #: 0.8 x10 3/mm — ABNORMAL HIGH (ref 0.0–0.7)
HCT: 28.9 % — ABNORMAL LOW (ref 35.0–47.0)
HGB: 9.5 g/dL — AB (ref 12.0–16.0)
Lymphocyte #: 1.2 x10 3/mm (ref 1.0–3.6)
Lymphocyte %: 24.7 %
MCH: 32.7 pg (ref 26.0–34.0)
MCHC: 32.8 g/dL (ref 32.0–36.0)
MCV: 100 fL (ref 80–100)
Monocyte #: 0.4 x10 3/mm (ref 0.2–0.9)
Monocyte %: 9 %
Neutrophil #: 2.4 x10 3/mm (ref 1.4–6.5)
Neutrophil %: 49.2 %
Platelet: 165 x10 3/mm (ref 150–440)
RBC: 2.9 10*6/uL — ABNORMAL LOW (ref 3.80–5.20)
RDW: 13.7 % (ref 11.5–14.5)
WBC: 4.9 x10 3/mm (ref 3.6–11.0)

## 2014-03-03 LAB — COMPREHENSIVE METABOLIC PANEL
ALBUMIN: 3.5 g/dL (ref 3.4–5.0)
AST: 11 U/L — AB (ref 15–37)
Alkaline Phosphatase: 103 U/L
Anion Gap: 7 (ref 7–16)
BUN: 27 mg/dL — ABNORMAL HIGH (ref 7–18)
Bilirubin,Total: 0.3 mg/dL (ref 0.2–1.0)
CALCIUM: 9.1 mg/dL (ref 8.5–10.1)
CREATININE: 1.6 mg/dL — AB (ref 0.60–1.30)
Chloride: 109 mmol/L — ABNORMAL HIGH (ref 98–107)
Co2: 29 mmol/L (ref 21–32)
EGFR (Non-African Amer.): 31 — ABNORMAL LOW
GFR CALC AF AMER: 36 — AB
Glucose: 103 mg/dL — ABNORMAL HIGH (ref 65–99)
OSMOLALITY: 294 (ref 275–301)
Potassium: 4.6 mmol/L (ref 3.5–5.1)
SGPT (ALT): 13 U/L (ref 12–78)
Sodium: 145 mmol/L (ref 136–145)
Total Protein: 6.7 g/dL (ref 6.4–8.2)

## 2014-03-05 DIAGNOSIS — M25569 Pain in unspecified knee: Secondary | ICD-10-CM | POA: Insufficient documentation

## 2014-03-05 DIAGNOSIS — M25559 Pain in unspecified hip: Secondary | ICD-10-CM | POA: Insufficient documentation

## 2014-03-05 LAB — CA 125: CA 125: 101.2 U/mL — ABNORMAL HIGH (ref 0.0–34.0)

## 2014-03-06 LAB — URINALYSIS, COMPLETE
BACTERIA: NONE SEEN
BLOOD: NEGATIVE
Bilirubin,UR: NEGATIVE
Glucose,UR: NEGATIVE mg/dL (ref 0–75)
Ketone: NEGATIVE
Nitrite: NEGATIVE
Ph: 6 (ref 4.5–8.0)
Protein: NEGATIVE
RBC,UR: <1 /HPF (ref 0–5)
Specific Gravity: 1.018 (ref 1.003–1.030)

## 2014-03-08 LAB — URINE CULTURE

## 2014-03-11 ENCOUNTER — Ambulatory Visit: Payer: Self-pay | Admitting: Oncology

## 2014-03-26 ENCOUNTER — Ambulatory Visit: Payer: Self-pay | Admitting: Oncology

## 2014-04-02 LAB — URINALYSIS, COMPLETE
BACTERIA: NONE SEEN
BLOOD: NEGATIVE
Bilirubin,UR: NEGATIVE
Glucose,UR: NEGATIVE mg/dL (ref 0–75)
KETONE: NEGATIVE
LEUKOCYTE ESTERASE: NEGATIVE
Nitrite: NEGATIVE
Ph: 5 (ref 4.5–8.0)
Protein: NEGATIVE
Specific Gravity: 1.016 (ref 1.003–1.030)
WBC UR: 1 /HPF (ref 0–5)

## 2014-04-02 LAB — CBC CANCER CENTER
BASOS ABS: 0 x10 3/mm (ref 0.0–0.1)
Basophil %: 0.9 %
Eosinophil #: 0.5 x10 3/mm (ref 0.0–0.7)
Eosinophil %: 10.7 %
HCT: 26.6 % — AB (ref 35.0–47.0)
HGB: 8.8 g/dL — ABNORMAL LOW (ref 12.0–16.0)
LYMPHS ABS: 0.9 x10 3/mm — AB (ref 1.0–3.6)
Lymphocyte %: 18.8 %
MCH: 33.2 pg (ref 26.0–34.0)
MCHC: 33 g/dL (ref 32.0–36.0)
MCV: 101 fL — ABNORMAL HIGH (ref 80–100)
Monocyte #: 0.3 x10 3/mm (ref 0.2–0.9)
Monocyte %: 7.1 %
Neutrophil #: 3 x10 3/mm (ref 1.4–6.5)
Neutrophil %: 62.5 %
Platelet: 234 x10 3/mm (ref 150–440)
RBC: 2.65 10*6/uL — AB (ref 3.80–5.20)
RDW: 14 % (ref 11.5–14.5)
WBC: 4.8 x10 3/mm (ref 3.6–11.0)

## 2014-04-02 LAB — COMPREHENSIVE METABOLIC PANEL
ANION GAP: 8 (ref 7–16)
AST: 13 U/L — AB (ref 15–37)
Albumin: 3.1 g/dL — ABNORMAL LOW (ref 3.4–5.0)
Alkaline Phosphatase: 99 U/L
BILIRUBIN TOTAL: 0.3 mg/dL (ref 0.2–1.0)
BUN: 29 mg/dL — ABNORMAL HIGH (ref 7–18)
CO2: 28 mmol/L (ref 21–32)
CREATININE: 1.75 mg/dL — AB (ref 0.60–1.30)
Calcium, Total: 9.1 mg/dL (ref 8.5–10.1)
Chloride: 107 mmol/L (ref 98–107)
EGFR (Non-African Amer.): 28 — ABNORMAL LOW
GFR CALC AF AMER: 32 — AB
GLUCOSE: 127 mg/dL — AB (ref 65–99)
Osmolality: 292 (ref 275–301)
Potassium: 4.5 mmol/L (ref 3.5–5.1)
SGPT (ALT): 13 U/L (ref 12–78)
Sodium: 143 mmol/L (ref 136–145)
TOTAL PROTEIN: 6.4 g/dL (ref 6.4–8.2)

## 2014-04-04 LAB — URINE CULTURE

## 2014-04-09 LAB — CBC CANCER CENTER
BASOS ABS: 0 x10 3/mm (ref 0.0–0.1)
Basophil %: 0.4 %
EOS PCT: 3.5 %
Eosinophil #: 0.1 x10 3/mm (ref 0.0–0.7)
HCT: 24.9 % — AB (ref 35.0–47.0)
HGB: 8.5 g/dL — ABNORMAL LOW (ref 12.0–16.0)
Lymphocyte #: 0.5 x10 3/mm — ABNORMAL LOW (ref 1.0–3.6)
Lymphocyte %: 16.6 %
MCH: 33.3 pg (ref 26.0–34.0)
MCHC: 34.3 g/dL (ref 32.0–36.0)
MCV: 97 fL (ref 80–100)
MONO ABS: 0.1 x10 3/mm — AB (ref 0.2–0.9)
MONOS PCT: 2.3 %
Neutrophil #: 2.2 x10 3/mm (ref 1.4–6.5)
Neutrophil %: 77.2 %
PLATELETS: 109 x10 3/mm — AB (ref 150–440)
RBC: 2.56 10*6/uL — AB (ref 3.80–5.20)
RDW: 13.4 % (ref 11.5–14.5)
WBC: 2.9 x10 3/mm — ABNORMAL LOW (ref 3.6–11.0)

## 2014-04-14 LAB — CBC CANCER CENTER
BASOS PCT: 0.1 %
Basophil #: 0 x10 3/mm (ref 0.0–0.1)
Eosinophil #: 0 x10 3/mm (ref 0.0–0.7)
Eosinophil %: 0.4 %
HCT: 23.3 % — AB (ref 35.0–47.0)
HGB: 8 g/dL — ABNORMAL LOW (ref 12.0–16.0)
Lymphocyte #: 0.5 x10 3/mm — ABNORMAL LOW (ref 1.0–3.6)
Lymphocyte %: 30.2 %
MCH: 33 pg (ref 26.0–34.0)
MCHC: 34.2 g/dL (ref 32.0–36.0)
MCV: 97 fL (ref 80–100)
Monocyte #: 0 x10 3/mm — ABNORMAL LOW (ref 0.2–0.9)
Monocyte %: 0.2 %
Neutrophil #: 1 x10 3/mm — ABNORMAL LOW (ref 1.4–6.5)
Neutrophil %: 69.1 %
Platelet: 31 x10 3/mm — ABNORMAL LOW (ref 150–440)
RBC: 2.42 10*6/uL — ABNORMAL LOW (ref 3.80–5.20)
RDW: 12.5 % (ref 11.5–14.5)
WBC: 1.5 x10 3/mm — CL (ref 3.6–11.0)

## 2014-04-14 LAB — COMPREHENSIVE METABOLIC PANEL
AST: 39 U/L — AB (ref 15–37)
Albumin: 3.1 g/dL — ABNORMAL LOW (ref 3.4–5.0)
Alkaline Phosphatase: 99 U/L
Anion Gap: 11 (ref 7–16)
BUN: 34 mg/dL — AB (ref 7–18)
Bilirubin,Total: 0.4 mg/dL (ref 0.2–1.0)
Calcium, Total: 9.3 mg/dL (ref 8.5–10.1)
Chloride: 102 mmol/L (ref 98–107)
Co2: 26 mmol/L (ref 21–32)
Creatinine: 1.71 mg/dL — ABNORMAL HIGH (ref 0.60–1.30)
EGFR (African American): 33 — ABNORMAL LOW
EGFR (Non-African Amer.): 29 — ABNORMAL LOW
Glucose: 111 mg/dL — ABNORMAL HIGH (ref 65–99)
OSMOLALITY: 286 (ref 275–301)
POTASSIUM: 4 mmol/L (ref 3.5–5.1)
SGPT (ALT): 48 U/L (ref 12–78)
Sodium: 139 mmol/L (ref 136–145)
Total Protein: 6.9 g/dL (ref 6.4–8.2)

## 2014-04-16 LAB — COMPREHENSIVE METABOLIC PANEL
ALBUMIN: 3.4 g/dL (ref 3.4–5.0)
Alkaline Phosphatase: 104 U/L
Anion Gap: 12 (ref 7–16)
BUN: 28 mg/dL — AB (ref 7–18)
Bilirubin,Total: 0.4 mg/dL (ref 0.2–1.0)
CALCIUM: 9.6 mg/dL (ref 8.5–10.1)
CREATININE: 1.67 mg/dL — AB (ref 0.60–1.30)
Chloride: 103 mmol/L (ref 98–107)
Co2: 25 mmol/L (ref 21–32)
EGFR (African American): 34 — ABNORMAL LOW
EGFR (Non-African Amer.): 30 — ABNORMAL LOW
Glucose: 142 mg/dL — ABNORMAL HIGH (ref 65–99)
OSMOLALITY: 287 (ref 275–301)
POTASSIUM: 4 mmol/L (ref 3.5–5.1)
SGOT(AST): 42 U/L — ABNORMAL HIGH (ref 15–37)
SGPT (ALT): 50 U/L
Sodium: 140 mmol/L (ref 136–145)
Total Protein: 7.2 g/dL (ref 6.4–8.2)

## 2014-04-16 LAB — CBC CANCER CENTER
BASOS ABS: 0 x10 3/mm (ref 0.0–0.1)
Basophil %: 0.1 %
Eosinophil #: 0 x10 3/mm (ref 0.0–0.7)
Eosinophil %: 0.9 %
HCT: 23.7 % — AB (ref 35.0–47.0)
HGB: 8 g/dL — AB (ref 12.0–16.0)
Lymphocyte #: 0.6 x10 3/mm — ABNORMAL LOW (ref 1.0–3.6)
Lymphocyte %: 66.5 %
MCH: 32.4 pg (ref 26.0–34.0)
MCHC: 33.7 g/dL (ref 32.0–36.0)
MCV: 96 fL (ref 80–100)
MONOS PCT: 1 %
Monocyte #: 0 x10 3/mm — ABNORMAL LOW (ref 0.2–0.9)
Neutrophil #: 0.3 x10 3/mm — ABNORMAL LOW (ref 1.4–6.5)
Neutrophil %: 31.5 %
Platelet: 14 x10 3/mm — CL (ref 150–440)
RBC: 2.47 10*6/uL — ABNORMAL LOW (ref 3.80–5.20)
RDW: 12.7 % (ref 11.5–14.5)
WBC: 0.9 x10 3/mm — CL (ref 3.6–11.0)

## 2014-04-18 LAB — CA 125: CA 125: 119.5 U/mL — AB (ref 0.0–34.0)

## 2014-04-23 LAB — CBC CANCER CENTER
BASOS PCT: 0.7 %
Basophil #: 0 x10 3/mm (ref 0.0–0.1)
EOS PCT: 5.2 %
Eosinophil #: 0.1 x10 3/mm (ref 0.0–0.7)
HCT: 24.4 % — AB (ref 35.0–47.0)
HGB: 8.5 g/dL — ABNORMAL LOW (ref 12.0–16.0)
Lymphocyte #: 0.8 x10 3/mm — ABNORMAL LOW (ref 1.0–3.6)
Lymphocyte %: 64.4 %
MCH: 33.4 pg (ref 26.0–34.0)
MCHC: 34.8 g/dL (ref 32.0–36.0)
MCV: 96 fL (ref 80–100)
MONO ABS: 0.3 x10 3/mm (ref 0.2–0.9)
Monocyte %: 20.5 %
NEUTROS ABS: 0.1 x10 3/mm — AB (ref 1.4–6.5)
Neutrophil %: 9.2 %
Platelet: 149 x10 3/mm — ABNORMAL LOW (ref 150–440)
RBC: 2.55 10*6/uL — AB (ref 3.80–5.20)
RDW: 12.9 % (ref 11.5–14.5)
WBC: 1.2 x10 3/mm — CL (ref 3.6–11.0)

## 2014-04-23 LAB — COMPREHENSIVE METABOLIC PANEL
ANION GAP: 3 — AB (ref 7–16)
Albumin: 3.4 g/dL (ref 3.4–5.0)
Alkaline Phosphatase: 117 U/L — ABNORMAL HIGH
BUN: 35 mg/dL — AB (ref 7–18)
Bilirubin,Total: 0.2 mg/dL (ref 0.2–1.0)
CHLORIDE: 123 mmol/L — AB (ref 98–107)
Calcium, Total: 9.4 mg/dL (ref 8.5–10.1)
Co2: 27 mmol/L (ref 21–32)
Creatinine: 2.22 mg/dL — ABNORMAL HIGH (ref 0.60–1.30)
EGFR (Non-African Amer.): 21 — ABNORMAL LOW
GFR CALC AF AMER: 24 — AB
Glucose: 131 mg/dL — ABNORMAL HIGH (ref 65–99)
OSMOLALITY: 313 (ref 275–301)
POTASSIUM: 4.5 mmol/L (ref 3.5–5.1)
SGOT(AST): 16 U/L (ref 15–37)
SGPT (ALT): 23 U/L
SODIUM: 153 mmol/L — AB (ref 136–145)
Total Protein: 7 g/dL (ref 6.4–8.2)

## 2014-04-24 LAB — CA 125: CA 125: 80.7 U/mL — AB (ref 0.0–34.0)

## 2014-04-26 ENCOUNTER — Ambulatory Visit: Payer: Self-pay | Admitting: Oncology

## 2014-04-30 LAB — CBC CANCER CENTER
Basophil #: 0 x10 3/mm (ref 0.0–0.1)
Basophil %: 1 %
EOS ABS: 0.1 x10 3/mm (ref 0.0–0.7)
Eosinophil %: 4.3 %
HCT: 24.9 % — ABNORMAL LOW (ref 35.0–47.0)
HGB: 8.4 g/dL — ABNORMAL LOW (ref 12.0–16.0)
Lymphocyte #: 1.3 x10 3/mm (ref 1.0–3.6)
Lymphocyte %: 40 %
MCH: 33.4 pg (ref 26.0–34.0)
MCHC: 33.9 g/dL (ref 32.0–36.0)
MCV: 99 fL (ref 80–100)
Monocyte #: 0.8 x10 3/mm (ref 0.2–0.9)
Monocyte %: 24.6 %
Neutrophil #: 1 x10 3/mm — ABNORMAL LOW (ref 1.4–6.5)
Neutrophil %: 30.1 %
PLATELETS: 290 x10 3/mm (ref 150–440)
RBC: 2.52 10*6/uL — ABNORMAL LOW (ref 3.80–5.20)
RDW: 13.6 % (ref 11.5–14.5)
WBC: 3.3 x10 3/mm — ABNORMAL LOW (ref 3.6–11.0)

## 2014-05-02 LAB — CBC CANCER CENTER
Basophil #: 0.1 x10 3/mm (ref 0.0–0.1)
Basophil %: 1.4 %
EOS PCT: 4.3 %
Eosinophil #: 0.2 x10 3/mm (ref 0.0–0.7)
HCT: 26.4 % — ABNORMAL LOW (ref 35.0–47.0)
HGB: 8.9 g/dL — ABNORMAL LOW (ref 12.0–16.0)
Lymphocyte #: 1.1 x10 3/mm (ref 1.0–3.6)
Lymphocyte %: 24 %
MCH: 33.5 pg (ref 26.0–34.0)
MCHC: 33.9 g/dL (ref 32.0–36.0)
MCV: 99 fL (ref 80–100)
Monocyte #: 0.8 x10 3/mm (ref 0.2–0.9)
Monocyte %: 18.4 %
NEUTROS PCT: 51.9 %
Neutrophil #: 2.3 x10 3/mm (ref 1.4–6.5)
PLATELETS: 317 x10 3/mm (ref 150–440)
RBC: 2.67 10*6/uL — ABNORMAL LOW (ref 3.80–5.20)
RDW: 14.6 % — ABNORMAL HIGH (ref 11.5–14.5)
WBC: 4.4 x10 3/mm (ref 3.6–11.0)

## 2014-05-02 LAB — URINALYSIS, COMPLETE
BACTERIA: NONE SEEN
Bilirubin,UR: NEGATIVE
Blood: NEGATIVE
GLUCOSE, UR: NEGATIVE mg/dL (ref 0–75)
Hyaline Cast: 2
Ketone: NEGATIVE
Leukocyte Esterase: NEGATIVE
Nitrite: NEGATIVE
PH: 5 (ref 4.5–8.0)
Protein: NEGATIVE
Specific Gravity: 1.019 (ref 1.003–1.030)

## 2014-05-02 LAB — COMPREHENSIVE METABOLIC PANEL
ALBUMIN: 3.5 g/dL (ref 3.4–5.0)
ALK PHOS: 100 U/L
AST: 15 U/L (ref 15–37)
Anion Gap: 9 (ref 7–16)
BUN: 40 mg/dL — AB (ref 7–18)
Bilirubin,Total: 0.2 mg/dL (ref 0.2–1.0)
CHLORIDE: 104 mmol/L (ref 98–107)
CO2: 29 mmol/L (ref 21–32)
CREATININE: 2.25 mg/dL — AB (ref 0.60–1.30)
Calcium, Total: 9.2 mg/dL (ref 8.5–10.1)
EGFR (African American): 24 — ABNORMAL LOW
EGFR (Non-African Amer.): 21 — ABNORMAL LOW
GLUCOSE: 124 mg/dL — AB (ref 65–99)
OSMOLALITY: 294 (ref 275–301)
Potassium: 4.5 mmol/L (ref 3.5–5.1)
SGPT (ALT): 14 U/L
SODIUM: 142 mmol/L (ref 136–145)
Total Protein: 7.1 g/dL (ref 6.4–8.2)

## 2014-05-04 LAB — URINE CULTURE

## 2014-05-12 LAB — CBC CANCER CENTER
BASOS PCT: 1.2 %
Basophil #: 0.1 x10 3/mm (ref 0.0–0.1)
Eosinophil #: 0.3 x10 3/mm (ref 0.0–0.7)
Eosinophil %: 4.5 %
HCT: 24.2 % — AB (ref 35.0–47.0)
HGB: 8 g/dL — ABNORMAL LOW (ref 12.0–16.0)
Lymphocyte #: 1 x10 3/mm (ref 1.0–3.6)
Lymphocyte %: 14.2 %
MCH: 33 pg (ref 26.0–34.0)
MCHC: 33 g/dL (ref 32.0–36.0)
MCV: 100 fL (ref 80–100)
Monocyte #: 0.8 x10 3/mm (ref 0.2–0.9)
Monocyte %: 10.6 %
NEUTROS ABS: 5 x10 3/mm (ref 1.4–6.5)
Neutrophil %: 69.5 %
Platelet: 262 x10 3/mm (ref 150–440)
RBC: 2.42 10*6/uL — ABNORMAL LOW (ref 3.80–5.20)
RDW: 15.6 % — AB (ref 11.5–14.5)
WBC: 7.2 x10 3/mm (ref 3.6–11.0)

## 2014-05-12 LAB — COMPREHENSIVE METABOLIC PANEL
ALK PHOS: 93 U/L
ANION GAP: 9 (ref 7–16)
Albumin: 3 g/dL — ABNORMAL LOW (ref 3.4–5.0)
BUN: 47 mg/dL — AB (ref 7–18)
Bilirubin,Total: 0.3 mg/dL (ref 0.2–1.0)
CALCIUM: 8.9 mg/dL (ref 8.5–10.1)
CHLORIDE: 106 mmol/L (ref 98–107)
CO2: 27 mmol/L (ref 21–32)
Creatinine: 2.09 mg/dL — ABNORMAL HIGH (ref 0.60–1.30)
EGFR (African American): 26 — ABNORMAL LOW
GFR CALC NON AF AMER: 23 — AB
Glucose: 126 mg/dL — ABNORMAL HIGH (ref 65–99)
Osmolality: 297 (ref 275–301)
Potassium: 5.5 mmol/L — ABNORMAL HIGH (ref 3.5–5.1)
SGOT(AST): 17 U/L (ref 15–37)
SGPT (ALT): 13 U/L — ABNORMAL LOW
Sodium: 142 mmol/L (ref 136–145)
TOTAL PROTEIN: 6.5 g/dL (ref 6.4–8.2)

## 2014-05-19 LAB — BASIC METABOLIC PANEL
ANION GAP: 10 (ref 7–16)
BUN: 33 mg/dL — ABNORMAL HIGH (ref 7–18)
CALCIUM: 9.3 mg/dL (ref 8.5–10.1)
CO2: 25 mmol/L (ref 21–32)
CREATININE: 1.78 mg/dL — AB (ref 0.60–1.30)
Chloride: 105 mmol/L (ref 98–107)
EGFR (African American): 32 — ABNORMAL LOW
EGFR (Non-African Amer.): 27 — ABNORMAL LOW
Glucose: 138 mg/dL — ABNORMAL HIGH (ref 65–99)
OSMOLALITY: 289 (ref 275–301)
Potassium: 3.7 mmol/L (ref 3.5–5.1)
Sodium: 140 mmol/L (ref 136–145)

## 2014-05-19 LAB — CBC CANCER CENTER
BASOS PCT: 1.5 %
Basophil #: 0 x10 3/mm (ref 0.0–0.1)
EOS PCT: 6.4 %
Eosinophil #: 0.1 x10 3/mm (ref 0.0–0.7)
HCT: 18.4 % — ABNORMAL LOW (ref 35.0–47.0)
HGB: 6.4 g/dL — ABNORMAL LOW (ref 12.0–16.0)
LYMPHS ABS: 0.9 x10 3/mm — AB (ref 1.0–3.6)
Lymphocyte %: 37.6 %
MCH: 33.8 pg (ref 26.0–34.0)
MCHC: 34.6 g/dL (ref 32.0–36.0)
MCV: 98 fL (ref 80–100)
Monocyte #: 0.1 x10 3/mm — ABNORMAL LOW (ref 0.2–0.9)
Monocyte %: 2.5 %
Neutrophil #: 1.2 x10 3/mm — ABNORMAL LOW (ref 1.4–6.5)
Neutrophil %: 52 %
Platelet: 165 x10 3/mm (ref 150–440)
RBC: 1.88 10*6/uL — ABNORMAL LOW (ref 3.80–5.20)
RDW: 15 % — ABNORMAL HIGH (ref 11.5–14.5)
WBC: 2.3 x10 3/mm — ABNORMAL LOW (ref 3.6–11.0)

## 2014-05-19 LAB — IRON AND TIBC
IRON BIND. CAP.(TOTAL): 293 ug/dL (ref 250–450)
Iron Saturation: 37 %
Iron: 107 ug/dL (ref 50–170)
Unbound Iron-Bind.Cap.: 186 ug/dL

## 2014-05-19 LAB — FERRITIN: Ferritin (ARMC): 592 ng/mL — ABNORMAL HIGH (ref 8–388)

## 2014-05-27 ENCOUNTER — Ambulatory Visit: Payer: Self-pay | Admitting: Oncology

## 2014-06-03 LAB — CBC CANCER CENTER
Basophil #: 0 x10 3/mm (ref 0.0–0.1)
Basophil %: 1.2 %
Eosinophil #: 0.1 x10 3/mm (ref 0.0–0.7)
Eosinophil %: 2.4 %
HCT: 28.3 % — ABNORMAL LOW (ref 35.0–47.0)
HGB: 9.4 g/dL — AB (ref 12.0–16.0)
LYMPHS ABS: 1 x10 3/mm (ref 1.0–3.6)
Lymphocyte %: 28.3 %
MCH: 32.1 pg (ref 26.0–34.0)
MCHC: 33.2 g/dL (ref 32.0–36.0)
MCV: 97 fL (ref 80–100)
Monocyte #: 0.5 x10 3/mm (ref 0.2–0.9)
Monocyte %: 13 %
Neutrophil #: 2 x10 3/mm (ref 1.4–6.5)
Neutrophil %: 55.1 %
Platelet: 206 x10 3/mm (ref 150–440)
RBC: 2.93 10*6/uL — AB (ref 3.80–5.20)
RDW: 19.1 % — AB (ref 11.5–14.5)
WBC: 3.6 x10 3/mm (ref 3.6–11.0)

## 2014-06-03 LAB — COMPREHENSIVE METABOLIC PANEL
ANION GAP: 6 — AB (ref 7–16)
Albumin: 3.5 g/dL (ref 3.4–5.0)
Alkaline Phosphatase: 103 U/L
BUN: 29 mg/dL — ABNORMAL HIGH (ref 7–18)
Bilirubin,Total: 0.3 mg/dL (ref 0.2–1.0)
CALCIUM: 9 mg/dL (ref 8.5–10.1)
CO2: 28 mmol/L (ref 21–32)
CREATININE: 1.68 mg/dL — AB (ref 0.60–1.30)
Chloride: 107 mmol/L (ref 98–107)
EGFR (Non-African Amer.): 29 — ABNORMAL LOW
GFR CALC AF AMER: 34 — AB
Glucose: 109 mg/dL — ABNORMAL HIGH (ref 65–99)
OSMOLALITY: 288 (ref 275–301)
POTASSIUM: 4.2 mmol/L (ref 3.5–5.1)
SGOT(AST): 16 U/L (ref 15–37)
SGPT (ALT): 13 U/L — ABNORMAL LOW
Sodium: 141 mmol/L (ref 136–145)
Total Protein: 7 g/dL (ref 6.4–8.2)

## 2014-06-04 LAB — CA 125: CA 125: 60.3 U/mL — ABNORMAL HIGH (ref 0.0–34.0)

## 2014-06-10 LAB — COMPREHENSIVE METABOLIC PANEL
Albumin: 3.6 g/dL (ref 3.4–5.0)
Alkaline Phosphatase: 105 U/L
Anion Gap: 8 (ref 7–16)
BUN: 37 mg/dL — AB (ref 7–18)
Bilirubin,Total: 0.3 mg/dL (ref 0.2–1.0)
CO2: 27 mmol/L (ref 21–32)
Calcium, Total: 9.1 mg/dL (ref 8.5–10.1)
Chloride: 105 mmol/L (ref 98–107)
Creatinine: 1.84 mg/dL — ABNORMAL HIGH (ref 0.60–1.30)
EGFR (Non-African Amer.): 26 — ABNORMAL LOW
GFR CALC AF AMER: 31 — AB
GLUCOSE: 140 mg/dL — AB (ref 65–99)
Osmolality: 290 (ref 275–301)
POTASSIUM: 4.3 mmol/L (ref 3.5–5.1)
SGOT(AST): 17 U/L (ref 15–37)
SGPT (ALT): 13 U/L — ABNORMAL LOW
SODIUM: 140 mmol/L (ref 136–145)
Total Protein: 6.9 g/dL (ref 6.4–8.2)

## 2014-06-10 LAB — CBC CANCER CENTER
BASOS ABS: 0.1 x10 3/mm (ref 0.0–0.1)
Basophil %: 1.1 %
Eosinophil #: 0.2 x10 3/mm (ref 0.0–0.7)
Eosinophil %: 3 %
HCT: 28.4 % — ABNORMAL LOW (ref 35.0–47.0)
HGB: 9.5 g/dL — AB (ref 12.0–16.0)
LYMPHS PCT: 23.9 %
Lymphocyte #: 1.3 x10 3/mm (ref 1.0–3.6)
MCH: 32.6 pg (ref 26.0–34.0)
MCHC: 33.4 g/dL (ref 32.0–36.0)
MCV: 98 fL (ref 80–100)
Monocyte #: 0.5 x10 3/mm (ref 0.2–0.9)
Monocyte %: 10 %
NEUTROS ABS: 3.3 x10 3/mm (ref 1.4–6.5)
Neutrophil %: 62 %
Platelet: 144 x10 3/mm — ABNORMAL LOW (ref 150–440)
RBC: 2.91 10*6/uL — ABNORMAL LOW (ref 3.80–5.20)
RDW: 19.9 % — ABNORMAL HIGH (ref 11.5–14.5)
WBC: 5.3 x10 3/mm (ref 3.6–11.0)

## 2014-06-24 LAB — COMPREHENSIVE METABOLIC PANEL
ALBUMIN: 3.7 g/dL (ref 3.4–5.0)
ANION GAP: 9 (ref 7–16)
AST: 22 U/L (ref 15–37)
Alkaline Phosphatase: 95 U/L
BILIRUBIN TOTAL: 0.3 mg/dL (ref 0.2–1.0)
BUN: 34 mg/dL — AB (ref 7–18)
CALCIUM: 9.1 mg/dL (ref 8.5–10.1)
CHLORIDE: 106 mmol/L (ref 98–107)
CO2: 27 mmol/L (ref 21–32)
Creatinine: 1.87 mg/dL — ABNORMAL HIGH (ref 0.60–1.30)
EGFR (African American): 34 — ABNORMAL LOW
GFR CALC NON AF AMER: 28 — AB
GLUCOSE: 124 mg/dL — AB (ref 65–99)
Osmolality: 292 (ref 275–301)
POTASSIUM: 4.2 mmol/L (ref 3.5–5.1)
SGPT (ALT): 19 U/L
Sodium: 142 mmol/L (ref 136–145)
Total Protein: 6.6 g/dL (ref 6.4–8.2)

## 2014-06-24 LAB — CBC CANCER CENTER
BASOS PCT: 0.4 %
Basophil #: 0 x10 3/mm (ref 0.0–0.1)
EOS PCT: 3.1 %
Eosinophil #: 0.1 x10 3/mm (ref 0.0–0.7)
HCT: 23.6 % — ABNORMAL LOW (ref 35.0–47.0)
HGB: 7.9 g/dL — ABNORMAL LOW (ref 12.0–16.0)
LYMPHS ABS: 1.1 x10 3/mm (ref 1.0–3.6)
Lymphocyte %: 33.9 %
MCH: 32.9 pg (ref 26.0–34.0)
MCHC: 33.3 g/dL (ref 32.0–36.0)
MCV: 99 fL (ref 80–100)
MONOS PCT: 7.4 %
Monocyte #: 0.2 x10 3/mm (ref 0.2–0.9)
Neutrophil #: 1.8 x10 3/mm (ref 1.4–6.5)
Neutrophil %: 55.2 %
Platelet: 34 x10 3/mm — ABNORMAL LOW (ref 150–440)
RBC: 2.39 10*6/uL — ABNORMAL LOW (ref 3.80–5.20)
RDW: 19.3 % — AB (ref 11.5–14.5)
WBC: 3.3 x10 3/mm — ABNORMAL LOW (ref 3.6–11.0)

## 2014-06-26 ENCOUNTER — Ambulatory Visit: Payer: Self-pay | Admitting: Oncology

## 2014-06-27 LAB — COMPREHENSIVE METABOLIC PANEL
Albumin: 3.6 g/dL (ref 3.4–5.0)
Alkaline Phosphatase: 91 U/L
Anion Gap: 6 — ABNORMAL LOW (ref 7–16)
BUN: 33 mg/dL — ABNORMAL HIGH (ref 7–18)
Bilirubin,Total: 0.3 mg/dL (ref 0.2–1.0)
CO2: 27 mmol/L (ref 21–32)
CREATININE: 1.66 mg/dL — AB (ref 0.60–1.30)
Calcium, Total: 9.3 mg/dL (ref 8.5–10.1)
Chloride: 108 mmol/L — ABNORMAL HIGH (ref 98–107)
EGFR (Non-African Amer.): 32 — ABNORMAL LOW
GFR CALC AF AMER: 39 — AB
Glucose: 148 mg/dL — ABNORMAL HIGH (ref 65–99)
Osmolality: 291 (ref 275–301)
POTASSIUM: 4.5 mmol/L (ref 3.5–5.1)
SGOT(AST): 17 U/L (ref 15–37)
SGPT (ALT): 16 U/L
SODIUM: 141 mmol/L (ref 136–145)
TOTAL PROTEIN: 6.5 g/dL (ref 6.4–8.2)

## 2014-06-27 LAB — CBC CANCER CENTER
BASOS ABS: 0 x10 3/mm (ref 0.0–0.1)
BASOS PCT: 0.7 %
EOS PCT: 5.4 %
Eosinophil #: 0.1 x10 3/mm (ref 0.0–0.7)
HCT: 24 % — ABNORMAL LOW (ref 35.0–47.0)
HGB: 7.7 g/dL — ABNORMAL LOW (ref 12.0–16.0)
Lymphocyte #: 0.9 x10 3/mm — ABNORMAL LOW (ref 1.0–3.6)
Lymphocyte %: 34.2 %
MCH: 32.5 pg (ref 26.0–34.0)
MCHC: 32.3 g/dL (ref 32.0–36.0)
MCV: 101 fL — ABNORMAL HIGH (ref 80–100)
Monocyte #: 0.2 x10 3/mm (ref 0.2–0.9)
Monocyte %: 8.8 %
Neutrophil #: 1.3 x10 3/mm — ABNORMAL LOW (ref 1.4–6.5)
Neutrophil %: 50.9 %
Platelet: 46 x10 3/mm — ABNORMAL LOW (ref 150–440)
RBC: 2.38 10*6/uL — AB (ref 3.80–5.20)
RDW: 19.6 % — AB (ref 11.5–14.5)
WBC: 2.5 x10 3/mm — ABNORMAL LOW (ref 3.6–11.0)

## 2014-06-27 LAB — CA 125: CA 125: 52.8 U/mL — AB (ref 0.0–34.0)

## 2014-07-03 LAB — CBC CANCER CENTER
Basophil #: 0 x10 3/mm (ref 0.0–0.1)
Basophil %: 0.7 %
EOS ABS: 0.1 x10 3/mm (ref 0.0–0.7)
EOS PCT: 5.4 %
HCT: 27.9 % — AB (ref 35.0–47.0)
HGB: 9.2 g/dL — ABNORMAL LOW (ref 12.0–16.0)
Lymphocyte #: 0.8 x10 3/mm — ABNORMAL LOW (ref 1.0–3.6)
Lymphocyte %: 43 %
MCH: 33.4 pg (ref 26.0–34.0)
MCHC: 33.1 g/dL (ref 32.0–36.0)
MCV: 101 fL — AB (ref 80–100)
Monocyte #: 0.3 x10 3/mm (ref 0.2–0.9)
Monocyte %: 15.7 %
NEUTROS ABS: 0.7 x10 3/mm — AB (ref 1.4–6.5)
Neutrophil %: 35.2 %
Platelet: 109 x10 3/mm — ABNORMAL LOW (ref 150–440)
RBC: 2.77 10*6/uL — ABNORMAL LOW (ref 3.80–5.20)
RDW: 20.2 % — ABNORMAL HIGH (ref 11.5–14.5)
WBC: 2 x10 3/mm — CL (ref 3.6–11.0)

## 2014-07-03 LAB — COMPREHENSIVE METABOLIC PANEL
AST: 15 U/L (ref 15–37)
Albumin: 3.3 g/dL — ABNORMAL LOW (ref 3.4–5.0)
Alkaline Phosphatase: 116 U/L
Anion Gap: 8 (ref 7–16)
BUN: 28 mg/dL — ABNORMAL HIGH (ref 7–18)
Bilirubin,Total: 0.2 mg/dL (ref 0.2–1.0)
CO2: 28 mmol/L (ref 21–32)
Calcium, Total: 8.8 mg/dL (ref 8.5–10.1)
Chloride: 107 mmol/L (ref 98–107)
Creatinine: 1.71 mg/dL — ABNORMAL HIGH (ref 0.60–1.30)
EGFR (African American): 37 — ABNORMAL LOW
GFR CALC NON AF AMER: 31 — AB
GLUCOSE: 143 mg/dL — AB (ref 65–99)
Osmolality: 293 (ref 275–301)
POTASSIUM: 5 mmol/L (ref 3.5–5.1)
SGPT (ALT): 14 U/L
SODIUM: 143 mmol/L (ref 136–145)
TOTAL PROTEIN: 6.4 g/dL (ref 6.4–8.2)

## 2014-07-08 DIAGNOSIS — M65969 Unspecified synovitis and tenosynovitis, unspecified lower leg: Secondary | ICD-10-CM | POA: Insufficient documentation

## 2014-07-08 DIAGNOSIS — M659 Synovitis and tenosynovitis, unspecified: Secondary | ICD-10-CM | POA: Insufficient documentation

## 2014-07-09 LAB — CBC CANCER CENTER
BASOS ABS: 0 x10 3/mm (ref 0.0–0.1)
Basophil %: 0.4 %
EOS ABS: 0 x10 3/mm (ref 0.0–0.7)
Eosinophil %: 0.1 %
HCT: 26.3 % — ABNORMAL LOW (ref 35.0–47.0)
HGB: 8.7 g/dL — ABNORMAL LOW (ref 12.0–16.0)
LYMPHS PCT: 14.9 %
Lymphocyte #: 0.5 x10 3/mm — ABNORMAL LOW (ref 1.0–3.6)
MCH: 33.3 pg (ref 26.0–34.0)
MCHC: 33.1 g/dL (ref 32.0–36.0)
MCV: 101 fL — ABNORMAL HIGH (ref 80–100)
MONO ABS: 0.5 x10 3/mm (ref 0.2–0.9)
Monocyte %: 14.7 %
NEUTROS PCT: 69.9 %
Neutrophil #: 2.3 x10 3/mm (ref 1.4–6.5)
Platelet: 193 x10 3/mm (ref 150–440)
RBC: 2.61 10*6/uL — AB (ref 3.80–5.20)
RDW: 20.8 % — AB (ref 11.5–14.5)
WBC: 3.2 x10 3/mm — AB (ref 3.6–11.0)

## 2014-07-09 LAB — COMPREHENSIVE METABOLIC PANEL
ALBUMIN: 3.3 g/dL — AB (ref 3.4–5.0)
Alkaline Phosphatase: 97 U/L
Anion Gap: 7 (ref 7–16)
BUN: 31 mg/dL — ABNORMAL HIGH (ref 7–18)
Bilirubin,Total: 0.4 mg/dL (ref 0.2–1.0)
CO2: 27 mmol/L (ref 21–32)
Calcium, Total: 9 mg/dL (ref 8.5–10.1)
Chloride: 108 mmol/L — ABNORMAL HIGH (ref 98–107)
Creatinine: 1.45 mg/dL — ABNORMAL HIGH (ref 0.60–1.30)
EGFR (African American): 45 — ABNORMAL LOW
EGFR (Non-African Amer.): 37 — ABNORMAL LOW
Glucose: 120 mg/dL — ABNORMAL HIGH (ref 65–99)
Osmolality: 291 (ref 275–301)
Potassium: 4.6 mmol/L (ref 3.5–5.1)
SGOT(AST): 13 U/L — ABNORMAL LOW (ref 15–37)
SGPT (ALT): 12 U/L — ABNORMAL LOW
Sodium: 142 mmol/L (ref 136–145)
TOTAL PROTEIN: 6.6 g/dL (ref 6.4–8.2)

## 2014-07-10 LAB — CA 125: CA 125: 56 U/mL — ABNORMAL HIGH (ref 0.0–34.0)

## 2014-07-16 LAB — COMPREHENSIVE METABOLIC PANEL
ALBUMIN: 3.3 g/dL — AB (ref 3.4–5.0)
ALK PHOS: 91 U/L
AST: 18 U/L (ref 15–37)
Anion Gap: 10 (ref 7–16)
BILIRUBIN TOTAL: 0.3 mg/dL (ref 0.2–1.0)
BUN: 26 mg/dL — AB (ref 7–18)
CHLORIDE: 108 mmol/L — AB (ref 98–107)
Calcium, Total: 9.1 mg/dL (ref 8.5–10.1)
Co2: 24 mmol/L (ref 21–32)
Creatinine: 1.37 mg/dL — ABNORMAL HIGH (ref 0.60–1.30)
EGFR (African American): 48 — ABNORMAL LOW
EGFR (Non-African Amer.): 40 — ABNORMAL LOW
Glucose: 115 mg/dL — ABNORMAL HIGH (ref 65–99)
OSMOLALITY: 289 (ref 275–301)
Potassium: 4.2 mmol/L (ref 3.5–5.1)
SGPT (ALT): 17 U/L
Sodium: 142 mmol/L (ref 136–145)
Total Protein: 6.9 g/dL (ref 6.4–8.2)

## 2014-07-16 LAB — CBC CANCER CENTER
Basophil #: 0 x10 3/mm (ref 0.0–0.1)
Basophil %: 1.1 %
EOS PCT: 1.3 %
Eosinophil #: 0 x10 3/mm (ref 0.0–0.7)
HCT: 24.5 % — ABNORMAL LOW (ref 35.0–47.0)
HGB: 8.1 g/dL — AB (ref 12.0–16.0)
LYMPHS ABS: 0.8 x10 3/mm — AB (ref 1.0–3.6)
Lymphocyte %: 42.2 %
MCH: 33.5 pg (ref 26.0–34.0)
MCHC: 33.1 g/dL (ref 32.0–36.0)
MCV: 101 fL — AB (ref 80–100)
Monocyte #: 0.1 x10 3/mm — ABNORMAL LOW (ref 0.2–0.9)
Monocyte %: 4.3 %
Neutrophil #: 1 x10 3/mm — ABNORMAL LOW (ref 1.4–6.5)
Neutrophil %: 51.1 %
Platelet: 102 x10 3/mm — ABNORMAL LOW (ref 150–440)
RBC: 2.42 10*6/uL — AB (ref 3.80–5.20)
RDW: 19.8 % — ABNORMAL HIGH (ref 11.5–14.5)
WBC: 2 x10 3/mm — CL (ref 3.6–11.0)

## 2014-07-21 LAB — CBC CANCER CENTER
Basophil #: 0 x10 3/mm (ref 0.0–0.1)
Basophil %: 0.7 %
Eosinophil #: 0 x10 3/mm (ref 0.0–0.7)
Eosinophil %: 0.4 %
HCT: 26.6 % — ABNORMAL LOW (ref 35.0–47.0)
HGB: 8.8 g/dL — ABNORMAL LOW (ref 12.0–16.0)
Lymphocyte #: 0.8 x10 3/mm — ABNORMAL LOW (ref 1.0–3.6)
Lymphocyte %: 29.9 %
MCH: 33.7 pg (ref 26.0–34.0)
MCHC: 33.1 g/dL (ref 32.0–36.0)
MCV: 102 fL — ABNORMAL HIGH (ref 80–100)
Monocyte #: 0.2 x10 3/mm (ref 0.2–0.9)
Monocyte %: 9.7 %
Neutrophil #: 1.5 x10 3/mm (ref 1.4–6.5)
Neutrophil %: 59.3 %
Platelet: 66 x10 3/mm — ABNORMAL LOW (ref 150–440)
RBC: 2.61 10*6/uL — ABNORMAL LOW (ref 3.80–5.20)
RDW: 18.9 % — ABNORMAL HIGH (ref 11.5–14.5)
WBC: 2.6 x10 3/mm — ABNORMAL LOW (ref 3.6–11.0)

## 2014-07-27 ENCOUNTER — Ambulatory Visit: Payer: Self-pay | Admitting: Oncology

## 2014-07-30 LAB — COMPREHENSIVE METABOLIC PANEL
ALBUMIN: 3.8 g/dL (ref 3.4–5.0)
ALT: 15 U/L
Alkaline Phosphatase: 96 U/L
Anion Gap: 9 (ref 7–16)
BUN: 35 mg/dL — ABNORMAL HIGH (ref 7–18)
Bilirubin,Total: 0.4 mg/dL (ref 0.2–1.0)
CHLORIDE: 105 mmol/L (ref 98–107)
CREATININE: 1.93 mg/dL — AB (ref 0.60–1.30)
Calcium, Total: 9.6 mg/dL (ref 8.5–10.1)
Co2: 27 mmol/L (ref 21–32)
EGFR (African American): 33 — ABNORMAL LOW
EGFR (Non-African Amer.): 27 — ABNORMAL LOW
GLUCOSE: 121 mg/dL — AB (ref 65–99)
OSMOLALITY: 290 (ref 275–301)
POTASSIUM: 4.3 mmol/L (ref 3.5–5.1)
SGOT(AST): 21 U/L (ref 15–37)
Sodium: 141 mmol/L (ref 136–145)
Total Protein: 7 g/dL (ref 6.4–8.2)

## 2014-07-30 LAB — CBC CANCER CENTER
BASOS ABS: 0 x10 3/mm (ref 0.0–0.1)
Basophil %: 1.2 %
Eosinophil #: 0.1 x10 3/mm (ref 0.0–0.7)
Eosinophil %: 4.4 %
HCT: 28 % — ABNORMAL LOW (ref 35.0–47.0)
HGB: 9.3 g/dL — AB (ref 12.0–16.0)
LYMPHS ABS: 0.6 x10 3/mm — AB (ref 1.0–3.6)
Lymphocyte %: 30.9 %
MCH: 34.4 pg — AB (ref 26.0–34.0)
MCHC: 33.1 g/dL (ref 32.0–36.0)
MCV: 104 fL — ABNORMAL HIGH (ref 80–100)
MONO ABS: 0.4 x10 3/mm (ref 0.2–0.9)
Monocyte %: 19.1 %
NEUTROS ABS: 0.9 x10 3/mm — AB (ref 1.4–6.5)
NEUTROS PCT: 44.4 %
Platelet: 172 x10 3/mm (ref 150–440)
RBC: 2.7 10*6/uL — ABNORMAL LOW (ref 3.80–5.20)
RDW: 21.1 % — ABNORMAL HIGH (ref 11.5–14.5)
WBC: 2.1 x10 3/mm — ABNORMAL LOW (ref 3.6–11.0)

## 2014-07-31 LAB — CA 125: CA 125: 80.9 U/mL — ABNORMAL HIGH (ref 0.0–34.0)

## 2014-08-06 LAB — CBC CANCER CENTER
BASOS PCT: 1.3 %
Basophil #: 0 x10 3/mm (ref 0.0–0.1)
Eosinophil #: 0.1 x10 3/mm (ref 0.0–0.7)
Eosinophil %: 3.8 %
HCT: 27.2 % — AB (ref 35.0–47.0)
HGB: 9 g/dL — AB (ref 12.0–16.0)
LYMPHS ABS: 0.9 x10 3/mm — AB (ref 1.0–3.6)
Lymphocyte %: 28.9 %
MCH: 34.7 pg — AB (ref 26.0–34.0)
MCHC: 33.1 g/dL (ref 32.0–36.0)
MCV: 105 fL — ABNORMAL HIGH (ref 80–100)
Monocyte #: 0.5 x10 3/mm (ref 0.2–0.9)
Monocyte %: 14.5 %
NEUTROS ABS: 1.6 x10 3/mm (ref 1.4–6.5)
Neutrophil %: 51.5 %
PLATELETS: 139 x10 3/mm — AB (ref 150–440)
RBC: 2.59 10*6/uL — ABNORMAL LOW (ref 3.80–5.20)
RDW: 19.6 % — ABNORMAL HIGH (ref 11.5–14.5)
WBC: 3.2 x10 3/mm — ABNORMAL LOW (ref 3.6–11.0)

## 2014-08-06 LAB — COMPREHENSIVE METABOLIC PANEL
ALBUMIN: 3.5 g/dL (ref 3.4–5.0)
ALT: 16 U/L
Alkaline Phosphatase: 94 U/L
Anion Gap: 7 (ref 7–16)
BILIRUBIN TOTAL: 0.3 mg/dL (ref 0.2–1.0)
BUN: 30 mg/dL — AB (ref 7–18)
CHLORIDE: 108 mmol/L — AB (ref 98–107)
CO2: 30 mmol/L (ref 21–32)
CREATININE: 1.72 mg/dL — AB (ref 0.60–1.30)
Calcium, Total: 9 mg/dL (ref 8.5–10.1)
EGFR (African American): 37 — ABNORMAL LOW
EGFR (Non-African Amer.): 31 — ABNORMAL LOW
GLUCOSE: 151 mg/dL — AB (ref 65–99)
Osmolality: 298 (ref 275–301)
Potassium: 4.3 mmol/L (ref 3.5–5.1)
SGOT(AST): 15 U/L (ref 15–37)
Sodium: 145 mmol/L (ref 136–145)
Total Protein: 6.6 g/dL (ref 6.4–8.2)

## 2014-08-11 LAB — CBC CANCER CENTER
BASOS ABS: 0 x10 3/mm (ref 0.0–0.1)
BASOS PCT: 0.6 %
Eosinophil #: 0.1 x10 3/mm (ref 0.0–0.7)
Eosinophil %: 1.8 %
HCT: 30.7 % — AB (ref 35.0–47.0)
HGB: 10.3 g/dL — ABNORMAL LOW (ref 12.0–16.0)
LYMPHS ABS: 0.8 x10 3/mm — AB (ref 1.0–3.6)
LYMPHS PCT: 16.6 %
MCH: 34.4 pg — ABNORMAL HIGH (ref 26.0–34.0)
MCHC: 33.4 g/dL (ref 32.0–36.0)
MCV: 103 fL — AB (ref 80–100)
MONO ABS: 0.3 x10 3/mm (ref 0.2–0.9)
MONOS PCT: 5.5 %
NEUTROS ABS: 3.6 x10 3/mm (ref 1.4–6.5)
Neutrophil %: 75.5 %
PLATELETS: 168 x10 3/mm (ref 150–440)
RBC: 2.98 10*6/uL — ABNORMAL LOW (ref 3.80–5.20)
RDW: 18.3 % — ABNORMAL HIGH (ref 11.5–14.5)
WBC: 4.8 x10 3/mm (ref 3.6–11.0)

## 2014-08-11 LAB — COMPREHENSIVE METABOLIC PANEL
ALBUMIN: 3.6 g/dL (ref 3.4–5.0)
ALT: 16 U/L
ANION GAP: 9 (ref 7–16)
Alkaline Phosphatase: 91 U/L
BILIRUBIN TOTAL: 0.9 mg/dL (ref 0.2–1.0)
BUN: 35 mg/dL — ABNORMAL HIGH (ref 7–18)
CHLORIDE: 106 mmol/L (ref 98–107)
CO2: 25 mmol/L (ref 21–32)
Calcium, Total: 8.7 mg/dL (ref 8.5–10.1)
Creatinine: 1.5 mg/dL — ABNORMAL HIGH (ref 0.60–1.30)
GFR CALC AF AMER: 44 — AB
GFR CALC NON AF AMER: 36 — AB
Glucose: 118 mg/dL — ABNORMAL HIGH (ref 65–99)
Osmolality: 288 (ref 275–301)
POTASSIUM: 4.1 mmol/L (ref 3.5–5.1)
SGOT(AST): 26 U/L (ref 15–37)
Sodium: 140 mmol/L (ref 136–145)
Total Protein: 6.9 g/dL (ref 6.4–8.2)

## 2014-08-18 LAB — CBC CANCER CENTER
BASOS ABS: 0 x10 3/mm (ref 0.0–0.1)
BASOS PCT: 0.4 %
EOS ABS: 0 x10 3/mm (ref 0.0–0.7)
Eosinophil %: 0.3 %
HCT: 24.3 % — ABNORMAL LOW (ref 35.0–47.0)
HGB: 8.1 g/dL — ABNORMAL LOW (ref 12.0–16.0)
LYMPHS PCT: 10.7 %
Lymphocyte #: 0.4 x10 3/mm — ABNORMAL LOW (ref 1.0–3.6)
MCH: 34.4 pg — AB (ref 26.0–34.0)
MCHC: 33.3 g/dL (ref 32.0–36.0)
MCV: 103 fL — AB (ref 80–100)
Monocyte #: 0 x10 3/mm — ABNORMAL LOW (ref 0.2–0.9)
Monocyte %: 0.8 %
Neutrophil #: 3.5 x10 3/mm (ref 1.4–6.5)
Neutrophil %: 87.8 %
PLATELETS: 82 x10 3/mm — AB (ref 150–440)
RBC: 2.35 10*6/uL — ABNORMAL LOW (ref 3.80–5.20)
RDW: 17.4 % — ABNORMAL HIGH (ref 11.5–14.5)
WBC: 4 x10 3/mm (ref 3.6–11.0)

## 2014-08-26 ENCOUNTER — Ambulatory Visit: Payer: Self-pay | Admitting: Oncology

## 2014-08-27 LAB — CBC CANCER CENTER
BASOS ABS: 0 x10 3/mm (ref 0.0–0.1)
Basophil %: 0.2 %
Eosinophil #: 0 x10 3/mm (ref 0.0–0.7)
Eosinophil %: 1.7 %
HCT: 20.5 % — ABNORMAL LOW (ref 35.0–47.0)
HGB: 7 g/dL — AB (ref 12.0–16.0)
Lymphocyte #: 0.3 x10 3/mm — ABNORMAL LOW (ref 1.0–3.6)
Lymphocyte %: 11.3 %
MCH: 35.4 pg — AB (ref 26.0–34.0)
MCHC: 34.4 g/dL (ref 32.0–36.0)
MCV: 103 fL — ABNORMAL HIGH (ref 80–100)
Monocyte #: 0.1 x10 3/mm — ABNORMAL LOW (ref 0.2–0.9)
Monocyte %: 5.7 %
NEUTROS ABS: 1.9 x10 3/mm (ref 1.4–6.5)
Neutrophil %: 81.1 %
Platelet: 9 x10 3/mm — CL (ref 150–440)
RBC: 1.99 10*6/uL — ABNORMAL LOW (ref 3.80–5.20)
RDW: 16.7 % — ABNORMAL HIGH (ref 11.5–14.5)
WBC: 2.3 x10 3/mm — ABNORMAL LOW (ref 3.6–11.0)

## 2014-08-27 LAB — COMPREHENSIVE METABOLIC PANEL
ALK PHOS: 89 U/L
ALT: 13 U/L — AB
Albumin: 3.6 g/dL (ref 3.4–5.0)
Anion Gap: 10 (ref 7–16)
BILIRUBIN TOTAL: 0.7 mg/dL (ref 0.2–1.0)
BUN: 38 mg/dL — ABNORMAL HIGH (ref 7–18)
CHLORIDE: 101 mmol/L (ref 98–107)
CREATININE: 2.02 mg/dL — AB (ref 0.60–1.30)
Calcium, Total: 9.1 mg/dL (ref 8.5–10.1)
Co2: 28 mmol/L (ref 21–32)
EGFR (Non-African Amer.): 25 — ABNORMAL LOW
GFR CALC AF AMER: 31 — AB
Glucose: 147 mg/dL — ABNORMAL HIGH (ref 65–99)
Osmolality: 289 (ref 275–301)
POTASSIUM: 4.3 mmol/L (ref 3.5–5.1)
SGOT(AST): 12 U/L — ABNORMAL LOW (ref 15–37)
Sodium: 139 mmol/L (ref 136–145)
Total Protein: 6.7 g/dL (ref 6.4–8.2)

## 2014-08-28 LAB — CA 125: CA 125: 75.1 U/mL — AB (ref 0.0–34.0)

## 2014-08-29 LAB — CBC CANCER CENTER
BASOS ABS: 0 x10 3/mm (ref 0.0–0.1)
BASOS PCT: 0.2 %
EOS ABS: 0 x10 3/mm (ref 0.0–0.7)
Eosinophil %: 2.6 %
HCT: 23.8 % — AB (ref 35.0–47.0)
HGB: 8.1 g/dL — AB (ref 12.0–16.0)
Lymphocyte #: 0.6 x10 3/mm — ABNORMAL LOW (ref 1.0–3.6)
Lymphocyte %: 31 %
MCH: 33.1 pg (ref 26.0–34.0)
MCHC: 34.2 g/dL (ref 32.0–36.0)
MCV: 97 fL (ref 80–100)
Monocyte #: 0.2 x10 3/mm (ref 0.2–0.9)
Monocyte %: 11.3 %
Neutrophil #: 1 x10 3/mm — ABNORMAL LOW (ref 1.4–6.5)
Neutrophil %: 54.9 %
Platelet: 32 x10 3/mm — ABNORMAL LOW (ref 150–440)
RBC: 2.46 10*6/uL — ABNORMAL LOW (ref 3.80–5.20)
RDW: 18.8 % — ABNORMAL HIGH (ref 11.5–14.5)
WBC: 1.8 x10 3/mm — CL (ref 3.6–11.0)

## 2014-08-29 IMAGING — US US EXTREM LOW VENOUS*R*
1 series · 13 of 24 positions shown · non-contrast
Comparison: 08/23/2013

CLINICAL DATA: Pain and swelling.



[Series 1: us extrem low venous*right* · 0.08mm/px · 13 of 31 slices shown]
[im 1/31]
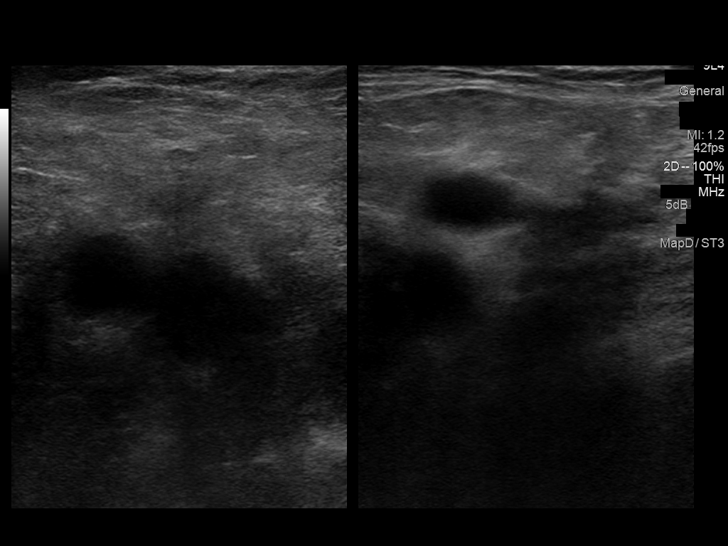
[im 3/31]
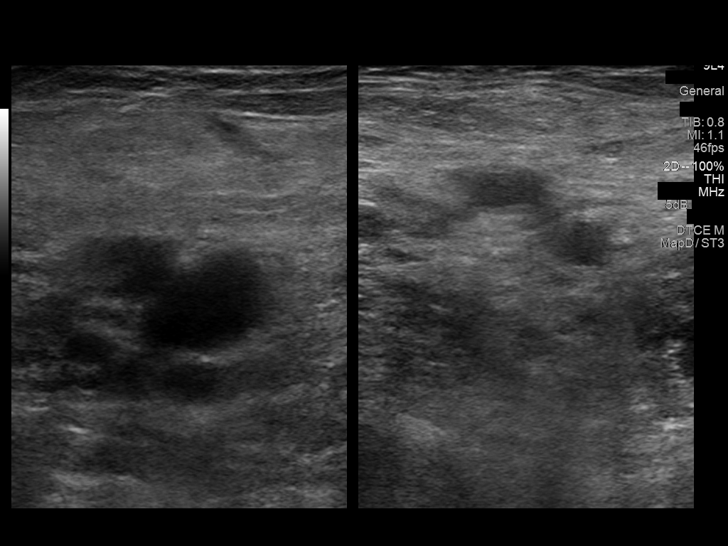
[im 6/31]
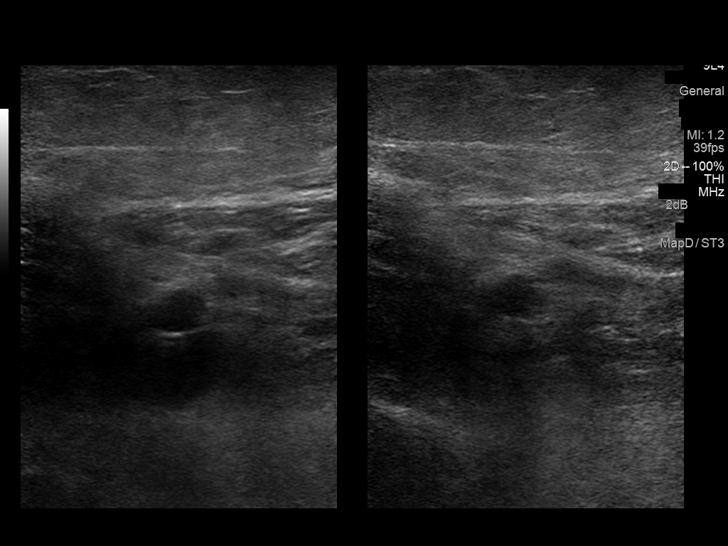
[im 8/31]
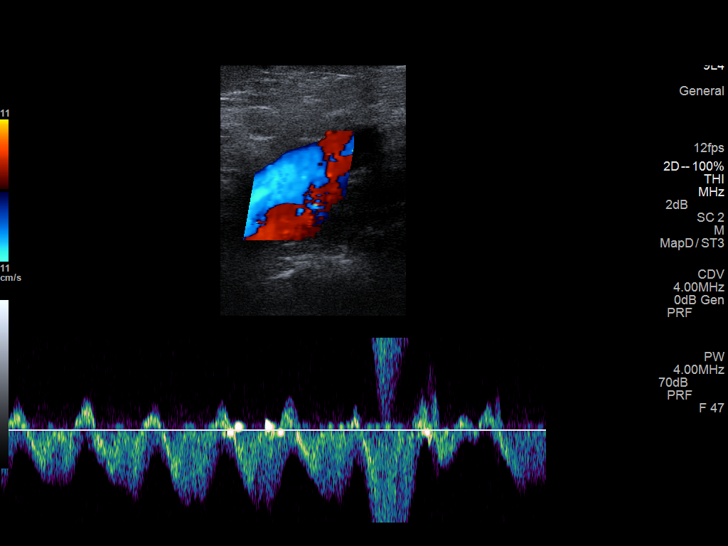
[im 11/31]
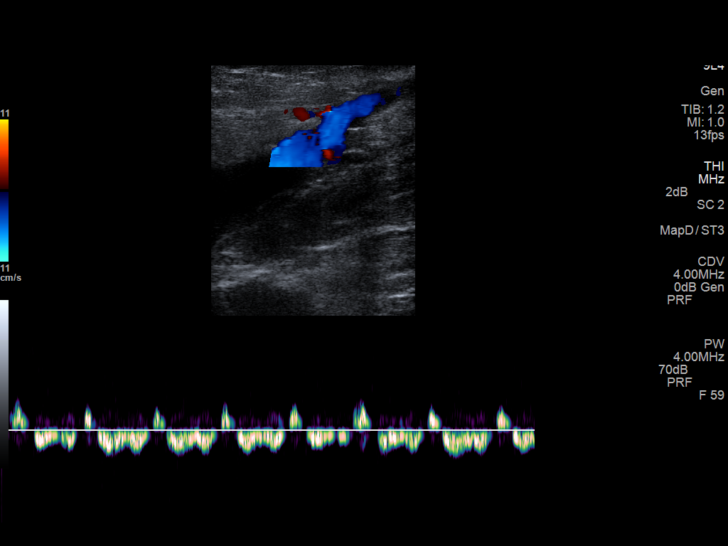
[im 14/31]
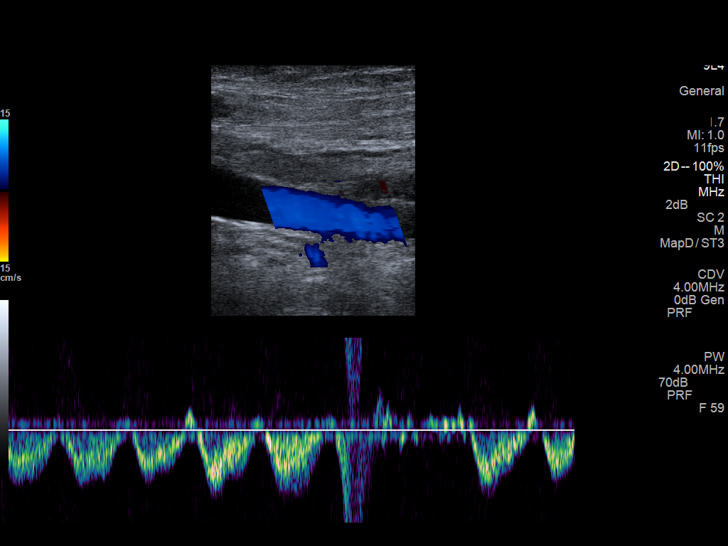
[im 16/31]
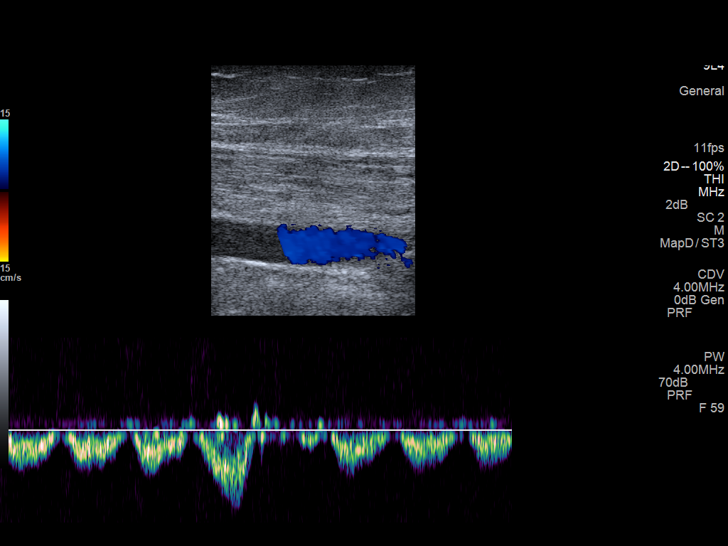
[im 17/31]
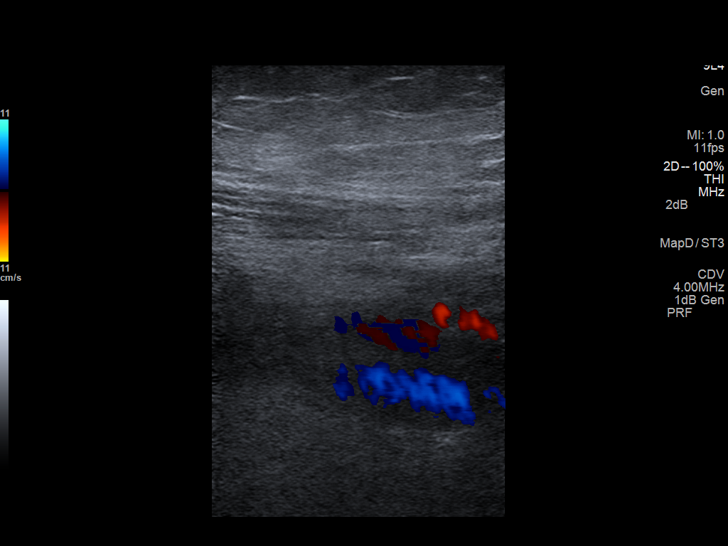
[im 20/31]
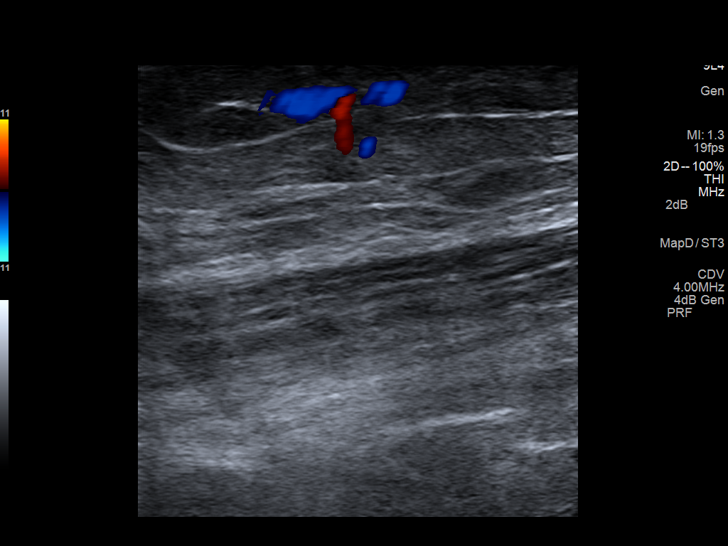
[im 23/31]
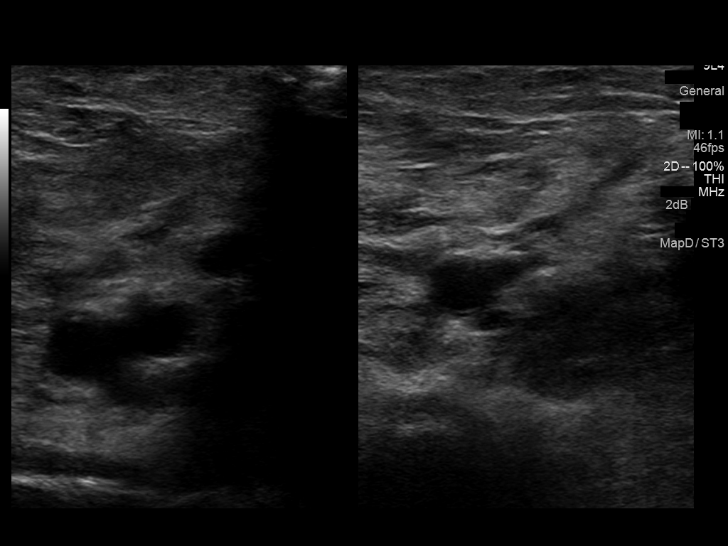
[im 25/31]
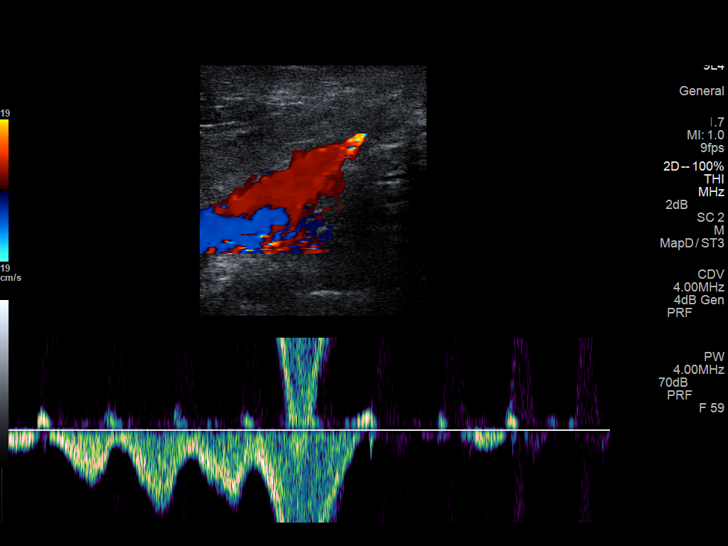
[im 28/31]
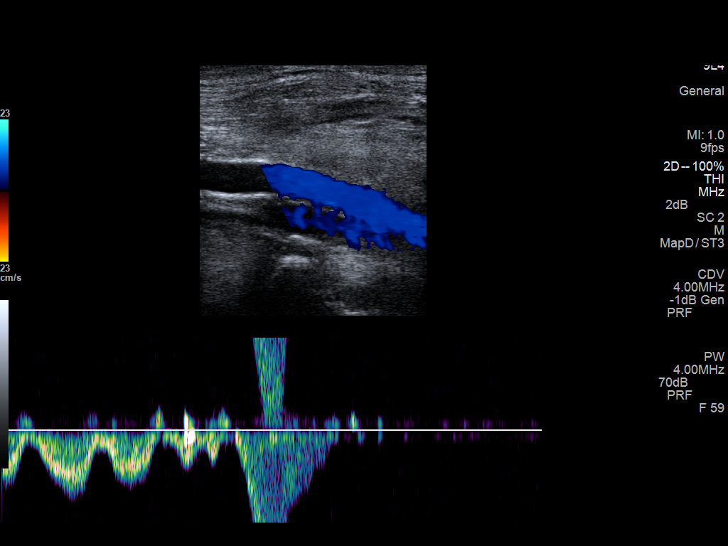
[im 31/31]
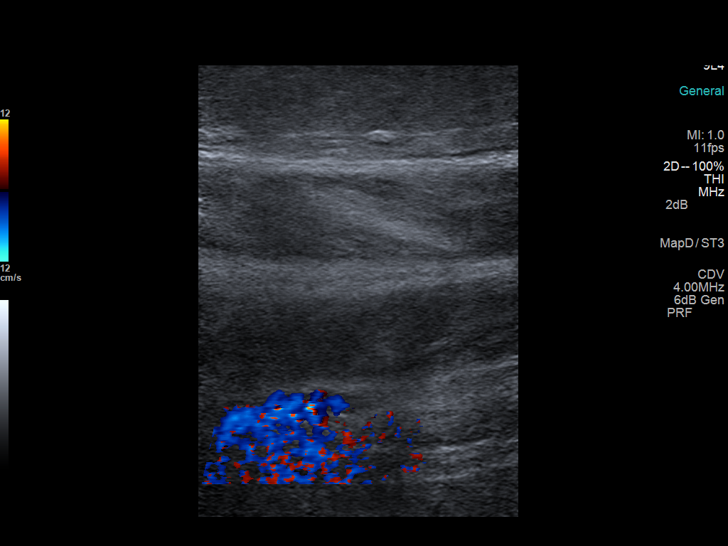

[13 of 24 positions shown; findings below may reference images not displayed]

FINDINGS: Common Femoral Vein: No evidence of thrombus. Normal
compressibility, respiratory phasicity and response to augmentation.

Saphenofemoral Junction: No evidence of thrombus. Normal
compressibility and flow on color Doppler imaging.

Profunda Femoral Vein: No evidence of thrombus. Normal
compressibility and flow on color Doppler imaging.

Femoral Vein: No evidence of thrombus. Normal compressibility,
respiratory phasicity and response to augmentation.

Popliteal Vein: No evidence of thrombus. Normal compressibility,
respiratory phasicity and response to augmentation.

Calf Veins: No evidence of thrombus. Normal compressibility and flow
on color Doppler imaging.

Superficial Great Saphenous Vein: No evidence of thrombus. Normal
compressibility and flow on color Doppler imaging.

Venous Reflux:  None.

Other Findings:  None.
IMPRESSION: No evidence of deep venous thrombosis.

## 2014-09-01 LAB — CBC CANCER CENTER
Basophil #: 0 x10 3/mm (ref 0.0–0.1)
Basophil %: 0.3 %
Eosinophil #: 0.2 x10 3/mm (ref 0.0–0.7)
Eosinophil %: 3.5 %
HCT: 24.1 % — ABNORMAL LOW (ref 35.0–47.0)
HGB: 8.1 g/dL — ABNORMAL LOW (ref 12.0–16.0)
LYMPHS ABS: 0.8 x10 3/mm — AB (ref 1.0–3.6)
Lymphocyte %: 19.3 %
MCH: 33.2 pg (ref 26.0–34.0)
MCHC: 33.7 g/dL (ref 32.0–36.0)
MCV: 99 fL (ref 80–100)
Monocyte #: 1 x10 3/mm — ABNORMAL HIGH (ref 0.2–0.9)
Monocyte %: 23.6 %
NEUTROS ABS: 2.3 x10 3/mm (ref 1.4–6.5)
NEUTROS PCT: 53.3 %
Platelet: 45 x10 3/mm — ABNORMAL LOW (ref 150–440)
RBC: 2.45 10*6/uL — AB (ref 3.80–5.20)
RDW: 17.7 % — ABNORMAL HIGH (ref 11.5–14.5)
WBC: 4.4 x10 3/mm (ref 3.6–11.0)

## 2014-09-10 LAB — COMPREHENSIVE METABOLIC PANEL
ALBUMIN: 2.9 g/dL — AB (ref 3.4–5.0)
ALK PHOS: 99 U/L
ANION GAP: 9 (ref 7–16)
BUN: 38 mg/dL — ABNORMAL HIGH (ref 7–18)
Bilirubin,Total: 0.3 mg/dL (ref 0.2–1.0)
CHLORIDE: 103 mmol/L (ref 98–107)
CO2: 28 mmol/L (ref 21–32)
CREATININE: 1.94 mg/dL — AB (ref 0.60–1.30)
Calcium, Total: 9.1 mg/dL (ref 8.5–10.1)
EGFR (African American): 32 — ABNORMAL LOW
GFR CALC NON AF AMER: 27 — AB
Glucose: 117 mg/dL — ABNORMAL HIGH (ref 65–99)
Osmolality: 289 (ref 275–301)
POTASSIUM: 4.1 mmol/L (ref 3.5–5.1)
SGOT(AST): 12 U/L — ABNORMAL LOW (ref 15–37)
SGPT (ALT): 11 U/L — ABNORMAL LOW
Sodium: 140 mmol/L (ref 136–145)
TOTAL PROTEIN: 7.3 g/dL (ref 6.4–8.2)

## 2014-09-10 LAB — CBC CANCER CENTER
Basophil #: 0 x10 3/mm (ref 0.0–0.1)
Basophil %: 0.2 %
EOS ABS: 0.2 x10 3/mm (ref 0.0–0.7)
EOS PCT: 1.4 %
HCT: 24.4 % — ABNORMAL LOW (ref 35.0–47.0)
HGB: 8.1 g/dL — AB (ref 12.0–16.0)
LYMPHS PCT: 7.7 %
Lymphocyte #: 0.9 x10 3/mm — ABNORMAL LOW (ref 1.0–3.6)
MCH: 33.5 pg (ref 26.0–34.0)
MCHC: 33.3 g/dL (ref 32.0–36.0)
MCV: 101 fL — ABNORMAL HIGH (ref 80–100)
Monocyte #: 0.6 x10 3/mm (ref 0.2–0.9)
Monocyte %: 5.2 %
NEUTROS ABS: 10.6 x10 3/mm — AB (ref 1.4–6.5)
Neutrophil %: 85.5 %
PLATELETS: 257 x10 3/mm (ref 150–440)
RBC: 2.43 10*6/uL — AB (ref 3.80–5.20)
RDW: 19.3 % — ABNORMAL HIGH (ref 11.5–14.5)
WBC: 12.4 x10 3/mm — ABNORMAL HIGH (ref 3.6–11.0)

## 2014-09-26 ENCOUNTER — Ambulatory Visit: Payer: Self-pay | Admitting: Oncology

## 2014-10-01 LAB — COMPREHENSIVE METABOLIC PANEL
ALT: 13 U/L — AB
AST: 13 U/L — AB (ref 15–37)
Albumin: 3.3 g/dL — ABNORMAL LOW (ref 3.4–5.0)
Alkaline Phosphatase: 88 U/L
Anion Gap: 7 (ref 7–16)
BUN: 31 mg/dL — AB (ref 7–18)
Bilirubin,Total: 0.3 mg/dL (ref 0.2–1.0)
CREATININE: 1.7 mg/dL — AB (ref 0.60–1.30)
Calcium, Total: 8.7 mg/dL (ref 8.5–10.1)
Chloride: 107 mmol/L (ref 98–107)
Co2: 30 mmol/L (ref 21–32)
EGFR (African American): 38 — ABNORMAL LOW
EGFR (Non-African Amer.): 31 — ABNORMAL LOW
Glucose: 113 mg/dL — ABNORMAL HIGH (ref 65–99)
Osmolality: 294 (ref 275–301)
Potassium: 4.1 mmol/L (ref 3.5–5.1)
Sodium: 144 mmol/L (ref 136–145)
TOTAL PROTEIN: 6.8 g/dL (ref 6.4–8.2)

## 2014-10-01 LAB — CBC CANCER CENTER
BASOS ABS: 0 x10 3/mm (ref 0.0–0.1)
Basophil %: 0.9 %
EOS ABS: 0.7 x10 3/mm (ref 0.0–0.7)
EOS PCT: 13 %
HCT: 26.3 % — AB (ref 35.0–47.0)
HGB: 8.9 g/dL — AB (ref 12.0–16.0)
Lymphocyte #: 1.4 x10 3/mm (ref 1.0–3.6)
Lymphocyte %: 27.1 %
MCH: 34.7 pg — ABNORMAL HIGH (ref 26.0–34.0)
MCHC: 33.7 g/dL (ref 32.0–36.0)
MCV: 103 fL — AB (ref 80–100)
MONO ABS: 0.4 x10 3/mm (ref 0.2–0.9)
Monocyte %: 8.5 %
NEUTROS PCT: 50.5 %
Neutrophil #: 2.6 x10 3/mm (ref 1.4–6.5)
Platelet: 189 x10 3/mm (ref 150–440)
RBC: 2.56 10*6/uL — AB (ref 3.80–5.20)
RDW: 18.9 % — ABNORMAL HIGH (ref 11.5–14.5)
WBC: 5.2 x10 3/mm (ref 3.6–11.0)

## 2014-10-03 LAB — CA 125: CA 125: 220.2 U/mL — AB (ref 0.0–34.0)

## 2014-10-15 LAB — CBC CANCER CENTER
BASOS ABS: 0.2 x10 3/mm — AB (ref 0.0–0.1)
Basophil %: 4.2 %
EOS ABS: 0.4 x10 3/mm (ref 0.0–0.7)
Eosinophil %: 8.6 %
HCT: 29.5 % — ABNORMAL LOW (ref 35.0–47.0)
HGB: 9.8 g/dL — ABNORMAL LOW (ref 12.0–16.0)
LYMPHS PCT: 16.6 %
Lymphocyte #: 0.7 x10 3/mm — ABNORMAL LOW (ref 1.0–3.6)
MCH: 34.5 pg — AB (ref 26.0–34.0)
MCHC: 33.1 g/dL (ref 32.0–36.0)
MCV: 104 fL — ABNORMAL HIGH (ref 80–100)
MONO ABS: 0.3 x10 3/mm (ref 0.2–0.9)
Monocyte %: 7.1 %
NEUTROS PCT: 63.5 %
Neutrophil #: 2.7 x10 3/mm (ref 1.4–6.5)
PLATELETS: 140 x10 3/mm — AB (ref 150–440)
RBC: 2.83 10*6/uL — ABNORMAL LOW (ref 3.80–5.20)
RDW: 16.6 % — AB (ref 11.5–14.5)
WBC: 4.2 x10 3/mm (ref 3.6–11.0)

## 2014-10-15 LAB — COMPREHENSIVE METABOLIC PANEL
ALK PHOS: 98 U/L
Albumin: 3.6 g/dL (ref 3.4–5.0)
Anion Gap: 8 (ref 7–16)
BUN: 41 mg/dL — ABNORMAL HIGH (ref 7–18)
Bilirubin,Total: 0.4 mg/dL (ref 0.2–1.0)
CALCIUM: 8.7 mg/dL (ref 8.5–10.1)
Chloride: 107 mmol/L (ref 98–107)
Co2: 27 mmol/L (ref 21–32)
Creatinine: 1.73 mg/dL — ABNORMAL HIGH (ref 0.60–1.30)
EGFR (African American): 37 — ABNORMAL LOW
EGFR (Non-African Amer.): 30 — ABNORMAL LOW
GLUCOSE: 141 mg/dL — AB (ref 65–99)
Osmolality: 296 (ref 275–301)
Potassium: 4.3 mmol/L (ref 3.5–5.1)
SGOT(AST): 13 U/L — ABNORMAL LOW (ref 15–37)
SGPT (ALT): 13 U/L — ABNORMAL LOW
Sodium: 142 mmol/L (ref 136–145)
TOTAL PROTEIN: 7 g/dL (ref 6.4–8.2)

## 2014-10-24 LAB — CBC CANCER CENTER
BASOS ABS: 0 x10 3/mm (ref 0.0–0.1)
Basophil %: 0.6 %
EOS PCT: 5.6 %
Eosinophil #: 0.2 x10 3/mm (ref 0.0–0.7)
HCT: 29.4 % — ABNORMAL LOW (ref 35.0–47.0)
HGB: 9.9 g/dL — ABNORMAL LOW (ref 12.0–16.0)
LYMPHS ABS: 0.9 x10 3/mm — AB (ref 1.0–3.6)
LYMPHS PCT: 22.5 %
MCH: 34.9 pg — ABNORMAL HIGH (ref 26.0–34.0)
MCHC: 33.7 g/dL (ref 32.0–36.0)
MCV: 104 fL — AB (ref 80–100)
MONO ABS: 0.3 x10 3/mm (ref 0.2–0.9)
MONOS PCT: 7.5 %
NEUTROS PCT: 63.8 %
Neutrophil #: 2.5 x10 3/mm (ref 1.4–6.5)
PLATELETS: 131 x10 3/mm — AB (ref 150–440)
RBC: 2.84 10*6/uL — AB (ref 3.80–5.20)
RDW: 14.9 % — AB (ref 11.5–14.5)
WBC: 3.9 x10 3/mm (ref 3.6–11.0)

## 2014-10-27 ENCOUNTER — Ambulatory Visit: Payer: Self-pay | Admitting: Oncology

## 2014-10-29 LAB — CBC CANCER CENTER
Basophil #: 0 x10 3/mm (ref 0.0–0.1)
Basophil %: 0.7 %
EOS ABS: 0.2 x10 3/mm (ref 0.0–0.7)
Eosinophil %: 3.8 %
HCT: 28.1 % — AB (ref 35.0–47.0)
HGB: 9.3 g/dL — ABNORMAL LOW (ref 12.0–16.0)
LYMPHS PCT: 20.5 %
Lymphocyte #: 0.9 x10 3/mm — ABNORMAL LOW (ref 1.0–3.6)
MCH: 34.1 pg — ABNORMAL HIGH (ref 26.0–34.0)
MCHC: 33 g/dL (ref 32.0–36.0)
MCV: 104 fL — ABNORMAL HIGH (ref 80–100)
Monocyte #: 0.3 x10 3/mm (ref 0.2–0.9)
Monocyte %: 7 %
NEUTROS PCT: 68 %
Neutrophil #: 3.1 x10 3/mm (ref 1.4–6.5)
Platelet: 136 x10 3/mm — ABNORMAL LOW (ref 150–440)
RBC: 2.71 10*6/uL — AB (ref 3.80–5.20)
RDW: 14.7 % — ABNORMAL HIGH (ref 11.5–14.5)
WBC: 4.5 x10 3/mm (ref 3.6–11.0)

## 2014-10-29 LAB — COMPREHENSIVE METABOLIC PANEL
ALBUMIN: 3.5 g/dL (ref 3.4–5.0)
ALT: 12 U/L — AB (ref 14–63)
Alkaline Phosphatase: 80 U/L (ref 46–116)
Anion Gap: 8 (ref 7–16)
BUN: 36 mg/dL — AB (ref 7–18)
Bilirubin,Total: 0.5 mg/dL (ref 0.2–1.0)
CALCIUM: 8.7 mg/dL (ref 8.5–10.1)
Chloride: 106 mmol/L (ref 98–107)
Co2: 29 mmol/L (ref 21–32)
Creatinine: 1.82 mg/dL — ABNORMAL HIGH (ref 0.60–1.30)
EGFR (African American): 35 — ABNORMAL LOW
EGFR (Non-African Amer.): 29 — ABNORMAL LOW
GLUCOSE: 136 mg/dL — AB (ref 65–99)
Osmolality: 295 (ref 275–301)
POTASSIUM: 4.4 mmol/L (ref 3.5–5.1)
SGOT(AST): 13 U/L — ABNORMAL LOW (ref 15–37)
Sodium: 143 mmol/L (ref 136–145)
Total Protein: 6.9 g/dL (ref 6.4–8.2)

## 2014-11-25 ENCOUNTER — Ambulatory Visit
Admit: 2014-11-25 | Disposition: A | Discharge status: Home / Self Care | Payer: Self-pay | Attending: Oncology | Admitting: Oncology

## 2014-12-19 LAB — CBC CANCER CENTER
Basophil #: 0 x10 3/mm (ref 0.0–0.1)
Basophil %: 0.6 %
EOS PCT: 2 %
Eosinophil #: 0.1 x10 3/mm (ref 0.0–0.7)
HCT: 27.6 % — ABNORMAL LOW (ref 35.0–47.0)
HGB: 9.4 g/dL — ABNORMAL LOW (ref 12.0–16.0)
Lymphocyte #: 1 x10 3/mm (ref 1.0–3.6)
Lymphocyte %: 26 %
MCH: 35.4 pg — ABNORMAL HIGH (ref 26.0–34.0)
MCHC: 34.1 g/dL (ref 32.0–36.0)
MCV: 104 fL — AB (ref 80–100)
Monocyte #: 0.3 x10 3/mm (ref 0.2–0.9)
Monocyte %: 8.5 %
NEUTROS ABS: 2.5 x10 3/mm (ref 1.4–6.5)
Neutrophil %: 62.9 %
Platelet: 142 x10 3/mm — ABNORMAL LOW (ref 150–440)
RBC: 2.66 10*6/uL — AB (ref 3.80–5.20)
RDW: 13.4 % (ref 11.5–14.5)
WBC: 4 x10 3/mm (ref 3.6–11.0)

## 2014-12-19 LAB — MAGNESIUM: Magnesium: 2.1 mg/dL

## 2014-12-19 LAB — BASIC METABOLIC PANEL
Anion Gap: 5 — ABNORMAL LOW (ref 7–16)
BUN: 38 mg/dL — ABNORMAL HIGH
Calcium, Total: 8.8 mg/dL — ABNORMAL LOW
Chloride: 106 mmol/L
Co2: 27 mmol/L
Creatinine: 1.62 mg/dL — ABNORMAL HIGH
EGFR (African American): 35 — ABNORMAL LOW
EGFR (Non-African Amer.): 31 — ABNORMAL LOW
Glucose: 135 mg/dL — ABNORMAL HIGH
POTASSIUM: 4.1 mmol/L
SODIUM: 138 mmol/L

## 2014-12-26 ENCOUNTER — Ambulatory Visit
Admit: 2014-12-26 | Disposition: A | Discharge status: Home / Self Care | Payer: Self-pay | Attending: Oncology | Admitting: Oncology

## 2014-12-31 LAB — CBC CANCER CENTER
BASOS ABS: 0 x10 3/mm (ref 0.0–0.1)
Basophil %: 0.6 %
EOS ABS: 0.1 x10 3/mm (ref 0.0–0.7)
EOS PCT: 3.7 %
HCT: 27.2 % — ABNORMAL LOW (ref 35.0–47.0)
HGB: 9.3 g/dL — ABNORMAL LOW (ref 12.0–16.0)
LYMPHS ABS: 1.2 x10 3/mm (ref 1.0–3.6)
Lymphocyte %: 35.7 %
MCH: 35.2 pg — AB (ref 26.0–34.0)
MCHC: 34.2 g/dL (ref 32.0–36.0)
MCV: 103 fL — AB (ref 80–100)
MONO ABS: 0.3 x10 3/mm (ref 0.2–0.9)
Monocyte %: 10 %
NEUTROS PCT: 50 %
Neutrophil #: 1.7 x10 3/mm (ref 1.4–6.5)
Platelet: 150 x10 3/mm (ref 150–440)
RBC: 2.65 10*6/uL — ABNORMAL LOW (ref 3.80–5.20)
RDW: 13.8 % (ref 11.5–14.5)
WBC: 3.3 x10 3/mm — ABNORMAL LOW (ref 3.6–11.0)

## 2014-12-31 LAB — COMPREHENSIVE METABOLIC PANEL
ALBUMIN: 3.8 g/dL
ALT: 9 U/L — AB
AST: 18 U/L
Alkaline Phosphatase: 65 U/L
Anion Gap: 6 — ABNORMAL LOW (ref 7–16)
BUN: 38 mg/dL — ABNORMAL HIGH
Bilirubin,Total: 0.4 mg/dL
Calcium, Total: 9.1 mg/dL
Chloride: 107 mmol/L
Co2: 26 mmol/L
Creatinine: 1.79 mg/dL — ABNORMAL HIGH
GFR CALC AF AMER: 31 — AB
GFR CALC NON AF AMER: 27 — AB
Glucose: 157 mg/dL — ABNORMAL HIGH
POTASSIUM: 4.4 mmol/L
SODIUM: 139 mmol/L
TOTAL PROTEIN: 6.8 g/dL

## 2014-12-31 LAB — MAGNESIUM: MAGNESIUM: 1.9 mg/dL

## 2015-01-01 LAB — CA 125: CA 125: 86.4 U/mL — ABNORMAL HIGH (ref 0.0–34.0)

## 2015-01-14 LAB — CBC CANCER CENTER
BASOS PCT: 0.6 %
Basophil #: 0 x10 3/mm (ref 0.0–0.1)
EOS PCT: 2.6 %
Eosinophil #: 0.1 x10 3/mm (ref 0.0–0.7)
HCT: 27.7 % — ABNORMAL LOW (ref 35.0–47.0)
HGB: 9.4 g/dL — AB (ref 12.0–16.0)
LYMPHS PCT: 26.3 %
Lymphocyte #: 1 x10 3/mm (ref 1.0–3.6)
MCH: 34.9 pg — ABNORMAL HIGH (ref 26.0–34.0)
MCHC: 34.1 g/dL (ref 32.0–36.0)
MCV: 103 fL — AB (ref 80–100)
Monocyte #: 0.3 x10 3/mm (ref 0.2–0.9)
Monocyte %: 8.4 %
Neutrophil #: 2.5 x10 3/mm (ref 1.4–6.5)
Neutrophil %: 62.1 %
Platelet: 157 x10 3/mm (ref 150–440)
RBC: 2.71 10*6/uL — ABNORMAL LOW (ref 3.80–5.20)
RDW: 14.4 % (ref 11.5–14.5)
WBC: 4 x10 3/mm (ref 3.6–11.0)

## 2015-01-14 LAB — COMPREHENSIVE METABOLIC PANEL
ALBUMIN: 3.9 g/dL
ALK PHOS: 66 U/L
ALT: 9 U/L — AB
AST: 18 U/L
Anion Gap: 7 (ref 7–16)
BILIRUBIN TOTAL: 0.5 mg/dL
BUN: 29 mg/dL — ABNORMAL HIGH
CHLORIDE: 106 mmol/L
CREATININE: 1.35 mg/dL — AB
Calcium, Total: 9.2 mg/dL
Co2: 27 mmol/L
EGFR (African American): 44 — ABNORMAL LOW
EGFR (Non-African Amer.): 38 — ABNORMAL LOW
Glucose: 116 mg/dL — ABNORMAL HIGH
POTASSIUM: 4 mmol/L
SODIUM: 140 mmol/L
TOTAL PROTEIN: 6.5 g/dL

## 2015-01-14 LAB — URINALYSIS, COMPLETE
Bacteria: NONE SEEN
Bilirubin,UR: NEGATIVE
Blood: NEGATIVE
Glucose,UR: NEGATIVE mg/dL (ref 0–75)
KETONE: NEGATIVE
Nitrite: NEGATIVE
Ph: 5 (ref 4.5–8.0)
Specific Gravity: 1.018 (ref 1.003–1.030)

## 2015-01-14 LAB — CREATININE, SERUM: Creat: 1.35

## 2015-01-14 LAB — MAGNESIUM: MAGNESIUM: 1.7 mg/dL

## 2015-01-15 ENCOUNTER — Other Ambulatory Visit: Payer: Self-pay | Admitting: Family Medicine

## 2015-01-15 ENCOUNTER — Encounter: Payer: Self-pay | Admitting: Family Medicine

## 2015-01-15 DIAGNOSIS — C786 Secondary malignant neoplasm of retroperitoneum and peritoneum: Secondary | ICD-10-CM

## 2015-01-15 DIAGNOSIS — C801 Malignant (primary) neoplasm, unspecified: Principal | ICD-10-CM

## 2015-01-15 DIAGNOSIS — C569 Malignant neoplasm of unspecified ovary: Secondary | ICD-10-CM | POA: Insufficient documentation

## 2015-01-15 HISTORY — DX: Secondary malignant neoplasm of retroperitoneum and peritoneum: C78.6

## 2015-01-16 LAB — URINE CULTURE

## 2015-01-16 NOTE — Discharge Summary (Signed)
PATIENT NAME:  Jessica Avery, Jessica Avery MR#:  [id number] DATE OF BIRTH:  01/17/1936  DATE OF ADMISSION:  05/07/2013 DATE OF DISCHARGE:  05/11/2013  HISTORY OF PRESENT ILLNESS: This 79 year old female had a history of severe lower abdominal pain, 30 pound weight loss, CT findings of large complex cystic mass in the pelvis, also evidence of mass formation in the medial wall of the ascending colon. She also had some recent constipation. Did have a colonoscopy which was with difficulty, but showed no mucosal lesion.   She had recently seen Dr. Claiborne Rigg and plans made for laparotomy with biopsy and possible debulking procedure, possible partial colectomy.   PAST MEDICAL HISTORY:  Includes: 1.  Severe depression.   2.  Hypertension.  3.  Hyperlipidemia.  4.  Type 2 diabetes mellitus.  5.  Pernicious anemia.  6.  Osteopenia.  7.  Macular degeneration.  8.  History of chronic back pain.  9.  History of chronic kidney disease.   HOSPITAL COURSE:  The patient came in through the outpatient surgery department. Did have a preop prophylactic antibiotic anticipating she may need a partial colectomy. She had laparotomy and findings of extensive carcinomatosis which involved pelvic mass and extensive peritoneal implants, including the mesentery and a large amount of tumor formation in the omentum. It did not appear that the debulking procedure could be carried out to the extent of significant improvement. She did have a biopsy of the omentum.   Postoperatively, she was treated with IV fluids, analgesics, prophylactic subcutaneous heparin, and was begun initially on a liquid diet and gradually advanced her diet and did move bowels prior to discharge.   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   Dr. Oliva Bustard was consulted and arrangements were made for chemotherapy. A Port-A-Cath was inserted. The patient was later discharged in satisfactory  condition.    ____________________________ Lenna Sciara. Rochel Brome, MD jws:dmm D: 05/27/2013 10:59:36 ET T: 05/27/2013 13:16:24 ET JOB#: [id number]  cc: Loreli Dollar, MD, <Dictator> Loreli Dollar MD ELECTRONICALLY SIGNED 05/29/2013 18:24

## 2015-01-16 NOTE — Op Note (Signed)
PATIENT NAME:  Jessica Avery, Jessica Avery MR#:  [id number] DATE OF BIRTH:  02/24/1936  DATE OF PROCEDURE:  05/07/2013  PREOPERATIVE DIAGNOSIS: Pelvic mass.   POSTOPERATIVE DIAGNOSIS: Ovarian cancer with carcinomatosis.   PROCEDURE: Exploratory laparotomy and omental biopsy.   SURGEON: Rochel Brome, MD  ASSISTANT: Jacquelyne Balint, MD   ANESTHESIA: General.   INDICATIONS: This 79 year old female has a history of lower abdominal pain, CT findings of a large complex right adnexal mass and also evidence of tumor implants, evidence of implant of the ascending colon. She has symptoms of narrowed caliber stools. She had elevated CA-125 at 413, and a laparotomy was recommended for further evaluation and treatment.   DESCRIPTION OF PROCEDURE: The patient was placed on the operating table in the supine position under general endotracheal anesthesia. The legs were elevated into the lithotomy position using bumblebee stirrups. The circulating nurse inserted a Foley urinary catheter with Betadine preparation of the perineum, draining a clear-yellow urine. The abdomen was prepared with ChloraPrep, and the perineum was prepared with Betadine. It is also noted vaginal exam could palpate a pelvic mass, which the largest portion of the pelvic mass was slightly to the left of the midline. The abdomen was draped out in a sterile manner.   A lower abdominal midline incision was made from just to the left of the umbilicus down to the pubic symphysis, carried down through subcutaneous tissues. Numerous small bleeding points were cauterized. The midline fascia was incised, and the peritoneal cavity was opened.   Initial inspection revealed there was a large mass within the omentum. There was a large pelvic mass on the right side which was greater than 5 cm. There was also carcinomatosis found within the pelvis that was involving the right adnexa and also the sigmoid colon. There was also tumor involving the more proximal aspect of  the sigmoid colon. There was tumor within the medial aspect of the ascending colon. There was foreshortening of the small bowel mesentery with multiple palpable tumor implants of the small and large bowel mesentery. There was no grossly palpable liver mass and no palpable nodules of the diaphragm.   At this point, it appeared that the carcinomatosis was quite extensive, and it did not appear that she could adequately have a debulking procedure, and decision was made to do omental biopsy and close the abdomen and arrange for plans for chemotherapy.   The omental biopsy was done using electrocautery, removing a portion of omental tissue which was approximately 5 to 6 grams of tissue, was excised as a wedge using electrocautery for hemostasis and submitted for pathology.  It is further noted that there was somewhat bloody ascites found at the beginning. Much of this was aspirated. Subsequently, it appeared that hemostasis was intact. The midline fascia was closed with interrupted 0 Maxon figure-of-eight sutures.  The skin was closed with clips. Dressings were applied with paper tape. The Foley catheter was removed, and the patient was prepared for transfer to the recovery room.   ____________________________ Lenna Sciara. Rochel Brome, MD jws:cb D: 05/07/2013 14:40:15 ET T: 05/07/2013 16:42:22 ET JOB#: [id number]  cc: Loreli Dollar, MD, <Dictator> Loreli Dollar MD ELECTRONICALLY SIGNED 05/10/2013 19:04

## 2015-01-16 NOTE — Op Note (Signed)
PATIENT NAME:  Jessica Avery, GHAZARIAN MR#:  [id number] DATE OF BIRTH:  [birth year]  DATE OF PROCEDURE:  11/26/2012  PREOPERATIVE DIAGNOSIS:  Cataract, left eye.    POSTOPERATIVE DIAGNOSIS:  Cataract, left eye.  PROCEDURE PERFORMED:  Extracapsular cataract extraction using phacoemulsification with placement of an Alcon [id number] 23.0-diopter posterior chamber lens, serial [id number].  SURGEON:  Loura Back. Calyx Hawker, MD  ASSISTANT:  None.  ANESTHESIA:  4% lidocaine and 0.75% Marcaine in a 50/50 mixture with 10 units/mL of Hylenex added, given as a peribulbar.   ANESTHESIOLOGIST:  Julianne Handler, MD  COMPLICATIONS:  None.  ESTIMATED BLOOD LOSS:  Less than 1 ml.  DESCRIPTION OF PROCEDURE:  The patient was brought to the operating room and given a peribulbar block.  The patient was then prepped and draped in the usual fashion.  The vertical rectus muscles were imbricated using 5-0 silk sutures.  These sutures were then clamped to the sterile drapes as bridle sutures.  A limbal peritomy was performed extending two clock hours and hemostasis was obtained with cautery.  A partial thickness scleral groove was made at the surgical limbus and dissected anteriorly in a lamellar dissection using an Alcon crescent knife.  The anterior chamber was entered supero-temporally with a Superblade and through the lamellar dissection with a 2.6 mm keratome.  DisCoVisc was used to replace the aqueous and a continuous tear capsulorrhexis was carried out.  Hydrodissection and hydrodelineation were carried out with balanced salt and a 27 gauge canula.  The nucleus was rotated to confirm the effectiveness of the hydrodissection.  Phacoemulsification was carried out using a divide-and-conquer technique.  Total ultrasound time was 58.1 seconds with an average power of 20.8 percent and CDE of 22.60.  Irrigation/aspiration was used to remove the residual cortex.  DisCoVisc was used to inflate the capsule and the internal  incision was enlarged to 3 mm with the crescent knife.  The intraocular lens was folded and inserted into the capsular bag using the AcrySert delivery system.  Irrigation/aspiration was used to remove the residual DisCoVisc.  Miostat was injected into the anterior chamber through the paracentesis track to inflate the anterior chamber and induce miosis. A tenth of a milliliter of cefuroxime was injected into the anterior chamber above the lens. The wound was checked for leaks and none were found. The conjunctiva was closed with cautery and the bridle sutures were removed.  Two drops of 0.3% Vigamox were placed on the eye.  An eye shield was placed on the eye.  The patient was discharged to the recovery room in good condition. ____________________________ Loura Back Jovan Colligan, MD sad:sb D: 11/26/2012 13:33:50 ET T: 11/26/2012 13:39:31 ET JOB#: [id number]  cc: Remo Lipps A. Consuela Widener, MD, <Dictator> Martie Lee MD ELECTRONICALLY SIGNED 12/03/2012 8:23

## 2015-01-16 NOTE — Consult Note (Signed)
patient was seen. Discussed situation with Dr. Claiborne Rigg note to follow as soon as pathology is availablemeeting has been set  on Thursday 1.30   afternoon to discuss options of treatmentwould need port placement and will e discussed that with Dr. Lavone Neri Smith.patient    Electronic Signatures: Jobe Gibbon (MD) (Signed on 13-Aug-14 08:35)  Authored   Last Updated: 13-Aug-14 14:57 by Jobe Gibbon (MD)

## 2015-01-16 NOTE — Op Note (Signed)
PATIENT NAME:  Jessica Avery, Jessica Avery MR#:  [id number] DATE OF BIRTH:  03/18/1936  DATE OF PROCEDURE:  05/09/2013  PREOPERATIVE DIAGNOSIS: Ovarian cancer.   POSTOPERATIVE DIAGNOSIS: Ovarian cancer.   PROCEDURE PERFORMED: Insertion of central venous catheter with subcutaneous infusion port.   SURGEON: Rochel Brome, MD  ANESTHESIA: Local with monitored anesthesia care.   INDICATIONS: This 79 year old female has had findings of carcinomatosis which is attributed to ovarian cancer now needing central venous access for chemotherapy.   DESCRIPTION OF PROCEDURE: The patient was placed on the operating table in the supine position and sedated. A rolled sheet was placed behind her shoulder blades. The head was placed on a doughnut ring. The neck was extended. The head turned 30 degrees to the left. The neck and right subclavian areas were prepared with ChloraPrep and draped in a sterile manner.   The skin beneath the clavicle was infiltrated with 1% Xylocaine. A transversely oriented 3 cm incision was made. A subcutaneous pouch was created inferior to the incision, anterior to the deep fascia large enough to admit the [place] port. Next, with the patient in the Trendelenburg position, the jugular vein was identified with ultrasound. The carotid artery was identified. The vein appeared normal. The skin overlying the jugular vein was infiltrated with 1% Xylocaine. A transversely oriented 5 mm incision was made.  Next, with ultrasound guidance, a needle was inserted into the jugular vein.  A guidewire was inserted. An ultrasonic image was saved for the paper chart. The needle was removed. The dilator and introducer sheath were advanced over the guidewire. The guidewire and dilator were removed, and the catheter was inserted.  Fluoroscopy was used to position the tip of the catheter in the superior vena cava at a distance of 13 cm from the incision.  A radiographic image was saved for the paper chart. Next, the catheter  was tunneled down to the subclavian incision. Pressure was held over the tunnel site. The catheter was cut to fit and attached to the [place] port using the accompanying sleeve to secure it. The port was accessed with a Huber needle, aspirated a trace of blood and flushed with 10 mL of saline. Next, the port was placed into the subcutaneous pouch and sutured to the deep fascia with 4-0 silk. Hemostasis was intact. It is noted that a number of small bleeding points were cauterized during the course of the procedure. Next, the subcutaneous pouch was closed with 4-0 Vicryl. Both incisions were closed with 4-0 Vicryl subcuticular sutures and Dermabond. The patient tolerated the procedure satisfactorily and was then prepared for transfer to the recovery room. ____________________________ Lenna Sciara. Rochel Brome, MD jws:sb D: 05/09/2013 08:33:39 ET T: 05/09/2013 09:12:24 ET JOB#: [id number]  cc: Loreli Dollar, MD, <Dictator> Loreli Dollar MD ELECTRONICALLY SIGNED 05/10/2013 19:04

## 2015-01-17 ENCOUNTER — Observation Stay
Admit: 2015-01-17 | Disposition: A | Discharge status: Home / Self Care | Payer: Self-pay | Attending: Internal Medicine | Admitting: Internal Medicine

## 2015-01-17 LAB — URINALYSIS, COMPLETE
BILIRUBIN, UR: NEGATIVE
GLUCOSE, UR: NEGATIVE mg/dL (ref 0–75)
Ketone: NEGATIVE
Nitrite: NEGATIVE
PH: 5 (ref 4.5–8.0)
Protein: 100
SPECIFIC GRAVITY: 1.024 (ref 1.003–1.030)

## 2015-01-17 LAB — COMPREHENSIVE METABOLIC PANEL
ALBUMIN: 3.3 g/dL — AB
ALK PHOS: 48 U/L
ALT: 13 U/L — AB
Anion Gap: 10 (ref 7–16)
BILIRUBIN TOTAL: 0.9 mg/dL
BUN: 40 mg/dL — AB
CHLORIDE: 103 mmol/L
CREATININE: 1.26 mg/dL — AB
Calcium, Total: 8.7 mg/dL — ABNORMAL LOW
Co2: 25 mmol/L
EGFR (Non-African Amer.): 41 — ABNORMAL LOW
GFR CALC AF AMER: 48 — AB
Glucose: 113 mg/dL — ABNORMAL HIGH
Potassium: 3.5 mmol/L
SGOT(AST): 21 U/L
SODIUM: 138 mmol/L
TOTAL PROTEIN: 6.1 g/dL — AB

## 2015-01-17 LAB — CBC
HCT: 30 % — AB (ref 35.0–47.0)
HGB: 10.1 g/dL — AB (ref 12.0–16.0)
MCH: 34.7 pg — AB (ref 26.0–34.0)
MCHC: 33.8 g/dL (ref 32.0–36.0)
MCV: 103 fL — AB (ref 80–100)
Platelet: 97 10*3/uL — ABNORMAL LOW (ref 150–440)
RBC: 2.93 10*6/uL — ABNORMAL LOW (ref 3.80–5.20)
RDW: 14.1 % (ref 11.5–14.5)
WBC: 3.8 10*3/uL (ref 3.6–11.0)

## 2015-01-17 LAB — LIPASE, BLOOD: Lipase: 20 U/L — ABNORMAL LOW

## 2015-01-17 LAB — PROTIME-INR
INR: 1.1
PROTHROMBIN TIME: 13.9 s

## 2015-01-17 LAB — DIFFERENTIAL
BASOS PCT: 0.1 %
Basophil #: 0 10*3/uL (ref 0.0–0.1)
EOS PCT: 0 %
Eosinophil #: 0 10*3/uL (ref 0.0–0.7)
LYMPHS ABS: 0.2 10*3/uL — AB (ref 1.0–3.6)
Lymphocyte %: 4.7 %
MONO ABS: 0.1 x10 3/mm — AB (ref 0.2–0.9)
Monocyte %: 2.7 %
Neutrophil #: 3.4 10*3/uL (ref 1.4–6.5)
Neutrophil %: 92.5 %

## 2015-01-17 LAB — CK TOTAL AND CKMB (NOT AT ARMC)
CK, Total: 49 U/L
CK-MB: 1.6 ng/mL

## 2015-01-17 LAB — TROPONIN I: Troponin-I: <0.03 ng/mL

## 2015-01-17 LAB — MAGNESIUM: MAGNESIUM: 1.5 mg/dL — AB

## 2015-01-17 LAB — APTT: Activated PTT: 33.7 secs (ref 23.6–35.9)

## 2015-01-18 LAB — COMPREHENSIVE METABOLIC PANEL
Albumin: 2.9 g/dL — ABNORMAL LOW
Alkaline Phosphatase: 41 U/L
Anion Gap: 5 — ABNORMAL LOW (ref 7–16)
BILIRUBIN TOTAL: 0.4 mg/dL
BUN: 38 mg/dL — ABNORMAL HIGH
CO2: 24 mmol/L
CREATININE: 1.13 mg/dL — AB
Calcium, Total: 8.3 mg/dL — ABNORMAL LOW
Chloride: 107 mmol/L
EGFR (Non-African Amer.): 47 — ABNORMAL LOW
GFR CALC AF AMER: 55 — AB
GLUCOSE: 81 mg/dL
Potassium: 3 mmol/L — ABNORMAL LOW
SGOT(AST): 19 U/L
SGPT (ALT): 12 U/L — ABNORMAL LOW
Sodium: 136 mmol/L
Total Protein: 5.5 g/dL — ABNORMAL LOW

## 2015-01-18 LAB — CBC WITH DIFFERENTIAL/PLATELET
BASOS PCT: 0.3 %
Basophil #: 0 10*3/uL (ref 0.0–0.1)
EOS PCT: 0.6 %
Eosinophil #: 0 10*3/uL (ref 0.0–0.7)
HCT: 27 % — ABNORMAL LOW (ref 35.0–47.0)
HGB: 9.3 g/dL — ABNORMAL LOW (ref 12.0–16.0)
LYMPHS ABS: 0.5 10*3/uL — AB (ref 1.0–3.6)
Lymphocyte %: 14.5 %
MCH: 35 pg — ABNORMAL HIGH (ref 26.0–34.0)
MCHC: 34.3 g/dL (ref 32.0–36.0)
MCV: 102 fL — ABNORMAL HIGH (ref 80–100)
MONO ABS: 0.1 x10 3/mm — AB (ref 0.2–0.9)
Monocyte %: 3.4 %
Neutrophil #: 2.7 10*3/uL (ref 1.4–6.5)
Neutrophil %: 81.2 %
PLATELETS: 86 10*3/uL — AB (ref 150–440)
RBC: 2.65 10*6/uL — ABNORMAL LOW (ref 3.80–5.20)
RDW: 13.9 % (ref 11.5–14.5)
WBC: 3.4 10*3/uL — AB (ref 3.6–11.0)

## 2015-01-18 LAB — MAGNESIUM: MAGNESIUM: 2.1 mg/dL

## 2015-01-18 LAB — CLOSTRIDIUM DIFFICILE(ARMC)

## 2015-01-19 LAB — CBC WITH DIFFERENTIAL/PLATELET
BASOS ABS: 0 10*3/uL (ref 0.0–0.1)
Basophil %: 0.4 %
EOS PCT: 1.9 %
Eosinophil #: 0 10*3/uL (ref 0.0–0.7)
HCT: 23.3 % — ABNORMAL LOW (ref 35.0–47.0)
HGB: 7.8 g/dL — AB (ref 12.0–16.0)
LYMPHS ABS: 0.6 10*3/uL — AB (ref 1.0–3.6)
Lymphocyte %: 24.9 %
MCH: 34.3 pg — ABNORMAL HIGH (ref 26.0–34.0)
MCHC: 33.4 g/dL (ref 32.0–36.0)
MCV: 103 fL — AB (ref 80–100)
MONOS PCT: 6.4 %
Monocyte #: 0.1 x10 3/mm — ABNORMAL LOW (ref 0.2–0.9)
NEUTROS PCT: 66.4 %
Neutrophil #: 1.5 10*3/uL (ref 1.4–6.5)
PLATELETS: 72 10*3/uL — AB (ref 150–440)
RBC: 2.26 10*6/uL — ABNORMAL LOW (ref 3.80–5.20)
RDW: 13.9 % (ref 11.5–14.5)
WBC: 2.3 10*3/uL — AB (ref 3.6–11.0)

## 2015-01-19 LAB — BASIC METABOLIC PANEL
Anion Gap: 4 — ABNORMAL LOW (ref 7–16)
BUN: 30 mg/dL — AB
CALCIUM: 8.2 mg/dL — AB
Chloride: 112 mmol/L — ABNORMAL HIGH
Co2: 22 mmol/L
Creatinine: 1.17 mg/dL — ABNORMAL HIGH
EGFR (African American): 52 — ABNORMAL LOW
GFR CALC NON AF AMER: 45 — AB
GLUCOSE: 66 mg/dL
POTASSIUM: 3.8 mmol/L
SODIUM: 138 mmol/L

## 2015-01-20 LAB — URINE CULTURE

## 2015-01-21 ENCOUNTER — Other Ambulatory Visit: Payer: Self-pay | Admitting: Oncology

## 2015-01-22 LAB — CBC CANCER CENTER
Basophil #: 0 x10 3/mm (ref 0.0–0.1)
Basophil %: 0.4 %
Eosinophil #: 0.1 x10 3/mm (ref 0.0–0.7)
Eosinophil %: 1.1 %
HCT: 26.5 % — ABNORMAL LOW (ref 35.0–47.0)
HGB: 9.1 g/dL — AB (ref 12.0–16.0)
LYMPHS PCT: 11.7 %
Lymphocyte #: 0.6 x10 3/mm — ABNORMAL LOW (ref 1.0–3.6)
MCH: 34.8 pg — ABNORMAL HIGH (ref 26.0–34.0)
MCHC: 34.3 g/dL (ref 32.0–36.0)
MCV: 102 fL — ABNORMAL HIGH (ref 80–100)
Monocyte #: 0.3 x10 3/mm (ref 0.2–0.9)
Monocyte %: 7.2 %
NEUTROS PCT: 79.6 %
Neutrophil #: 3.8 x10 3/mm (ref 1.4–6.5)
PLATELETS: 127 x10 3/mm — AB (ref 150–440)
RBC: 2.62 10*6/uL — ABNORMAL LOW (ref 3.80–5.20)
RDW: 14.4 % (ref 11.5–14.5)
WBC: 4.8 x10 3/mm (ref 3.6–11.0)

## 2015-01-22 LAB — COMPREHENSIVE METABOLIC PANEL
ALBUMIN: 3.5 g/dL
ALT: 17 U/L
ANION GAP: 6 — AB (ref 7–16)
Alkaline Phosphatase: 47 U/L
BILIRUBIN TOTAL: 0.7 mg/dL
BUN: 14 mg/dL
CREATININE: 1.27 mg/dL — AB
Calcium, Total: 8.9 mg/dL
Chloride: 106 mmol/L
Co2: 25 mmol/L
EGFR (African American): 47 — ABNORMAL LOW
EGFR (Non-African Amer.): 41 — ABNORMAL LOW
GLUCOSE: 113 mg/dL — AB
POTASSIUM: 4 mmol/L
SGOT(AST): 25 U/L
Sodium: 137 mmol/L
Total Protein: 6.1 g/dL — ABNORMAL LOW

## 2015-01-22 LAB — CULTURE, BLOOD (SINGLE)

## 2015-01-22 LAB — LIPASE, BLOOD: LIPASE: 55 U/L — AB

## 2015-01-22 LAB — AMYLASE: Amylase: 57 U/L

## 2015-01-23 ENCOUNTER — Inpatient Hospital Stay
Admission: AD | Admit: 2015-01-23 | Discharge: 2015-02-02 | DRG: 948 | Disposition: A | Discharge status: Skilled Nursing Facility | Payer: Medicare Other | Source: Ambulatory Visit | Attending: Oncology | Admitting: Oncology

## 2015-01-23 DIAGNOSIS — F332 Major depressive disorder, recurrent severe without psychotic features: Secondary | ICD-10-CM | POA: Diagnosis present

## 2015-01-23 DIAGNOSIS — K219 Gastro-esophageal reflux disease without esophagitis: Secondary | ICD-10-CM | POA: Diagnosis present

## 2015-01-23 DIAGNOSIS — M549 Dorsalgia, unspecified: Secondary | ICD-10-CM | POA: Diagnosis present

## 2015-01-23 DIAGNOSIS — Z515 Encounter for palliative care: Secondary | ICD-10-CM

## 2015-01-23 DIAGNOSIS — R410 Disorientation, unspecified: Secondary | ICD-10-CM | POA: Insufficient documentation

## 2015-01-23 DIAGNOSIS — I10 Essential (primary) hypertension: Secondary | ICD-10-CM | POA: Diagnosis present

## 2015-01-23 DIAGNOSIS — Z79899 Other long term (current) drug therapy: Secondary | ICD-10-CM | POA: Diagnosis not present

## 2015-01-23 DIAGNOSIS — C786 Secondary malignant neoplasm of retroperitoneum and peritoneum: Secondary | ICD-10-CM | POA: Diagnosis present

## 2015-01-23 DIAGNOSIS — C569 Malignant neoplasm of unspecified ovary: Secondary | ICD-10-CM | POA: Diagnosis present

## 2015-01-23 DIAGNOSIS — G893 Neoplasm related pain (acute) (chronic): Principal | ICD-10-CM | POA: Diagnosis present

## 2015-01-23 DIAGNOSIS — Z886 Allergy status to analgesic agent status: Secondary | ICD-10-CM

## 2015-01-23 DIAGNOSIS — R443 Hallucinations, unspecified: Secondary | ICD-10-CM | POA: Diagnosis not present

## 2015-01-23 DIAGNOSIS — D649 Anemia, unspecified: Secondary | ICD-10-CM | POA: Diagnosis present

## 2015-01-23 DIAGNOSIS — F339 Major depressive disorder, recurrent, unspecified: Secondary | ICD-10-CM | POA: Insufficient documentation

## 2015-01-23 DIAGNOSIS — K5900 Constipation, unspecified: Secondary | ICD-10-CM | POA: Diagnosis present

## 2015-01-23 DIAGNOSIS — F411 Generalized anxiety disorder: Secondary | ICD-10-CM | POA: Insufficient documentation

## 2015-01-23 DIAGNOSIS — R451 Restlessness and agitation: Secondary | ICD-10-CM | POA: Diagnosis not present

## 2015-01-23 DIAGNOSIS — R1084 Generalized abdominal pain: Secondary | ICD-10-CM | POA: Insufficient documentation

## 2015-01-23 DIAGNOSIS — Z87891 Personal history of nicotine dependence: Secondary | ICD-10-CM

## 2015-01-23 DIAGNOSIS — K5901 Slow transit constipation: Secondary | ICD-10-CM | POA: Insufficient documentation

## 2015-01-23 DIAGNOSIS — R109 Unspecified abdominal pain: Secondary | ICD-10-CM | POA: Diagnosis not present

## 2015-01-23 LAB — COMPREHENSIVE METABOLIC PANEL
ALT: 16 U/L
AST: 23 U/L
Albumin: 3.2 g/dL — ABNORMAL LOW
Alkaline Phosphatase: 46 U/L
Anion Gap: 7 (ref 7–16)
BILIRUBIN TOTAL: 0.7 mg/dL
BUN: 14 mg/dL
CHLORIDE: 107 mmol/L
CO2: 24 mmol/L
CREATININE: 1.35 mg/dL — AB
Calcium, Total: 8.6 mg/dL — ABNORMAL LOW
GFR CALC AF AMER: 44 — AB
GFR CALC NON AF AMER: 38 — AB
Glucose: 92 mg/dL
POTASSIUM: 4 mmol/L
SODIUM: 138 mmol/L
Total Protein: 5.6 g/dL — ABNORMAL LOW

## 2015-01-23 LAB — CBC WITH DIFFERENTIAL/PLATELET
BASOS PCT: 0.5 %
Basophil #: 0 10*3/uL (ref 0.0–0.1)
EOS PCT: 1.3 %
Eosinophil #: 0.1 10*3/uL (ref 0.0–0.7)
HCT: 25.4 % — ABNORMAL LOW (ref 35.0–47.0)
HGB: 8.4 g/dL — AB (ref 12.0–16.0)
Lymphocyte #: 0.9 10*3/uL — ABNORMAL LOW (ref 1.0–3.6)
Lymphocyte %: 19.7 %
MCH: 34.2 pg — AB (ref 26.0–34.0)
MCHC: 33.1 g/dL (ref 32.0–36.0)
MCV: 103 fL — AB (ref 80–100)
MONO ABS: 0.4 x10 3/mm (ref 0.2–0.9)
Monocyte %: 9.7 %
Neutrophil #: 3.1 10*3/uL (ref 1.4–6.5)
Neutrophil %: 68.8 %
Platelet: 124 10*3/uL — ABNORMAL LOW (ref 150–440)
RBC: 2.45 10*6/uL — ABNORMAL LOW (ref 3.80–5.20)
RDW: 14.3 % (ref 11.5–14.5)
WBC: 4.5 10*3/uL (ref 3.6–11.0)

## 2015-01-23 LAB — MAGNESIUM
Magnesium: 1.4 mg/dL — ABNORMAL LOW
Magnesium: 1.4 mg/dL — ABNORMAL LOW

## 2015-01-24 LAB — CBC WITH DIFFERENTIAL/PLATELET
BASOS ABS: 0 10*3/uL (ref 0.0–0.1)
BASOS PCT: 0.6 %
EOS ABS: 0.1 10*3/uL (ref 0.0–0.7)
Eosinophil %: 2.1 %
HCT: 22.3 % — ABNORMAL LOW (ref 35.0–47.0)
HGB: 7.7 g/dL — AB (ref 12.0–16.0)
LYMPHS PCT: 34.4 %
Lymphocyte #: 1.3 10*3/uL (ref 1.0–3.6)
MCH: 35.6 pg — ABNORMAL HIGH (ref 26.0–34.0)
MCHC: 34.4 g/dL (ref 32.0–36.0)
MCV: 104 fL — ABNORMAL HIGH (ref 80–100)
Monocyte #: 0.5 x10 3/mm (ref 0.2–0.9)
Monocyte %: 12.6 %
Neutrophil #: 1.9 10*3/uL (ref 1.4–6.5)
Neutrophil %: 50.3 %
Platelet: 118 10*3/uL — ABNORMAL LOW (ref 150–440)
RBC: 2.15 10*6/uL — ABNORMAL LOW (ref 3.80–5.20)
RDW: 14.4 % (ref 11.5–14.5)
WBC: 3.8 10*3/uL (ref 3.6–11.0)

## 2015-01-24 LAB — BASIC METABOLIC PANEL
Anion Gap: 5 — ABNORMAL LOW (ref 7–16)
BUN: 12 mg/dL
CALCIUM: 8.5 mg/dL — AB
CHLORIDE: 111 mmol/L
Co2: 24 mmol/L
Creatinine: 1.17 mg/dL — ABNORMAL HIGH
EGFR (African American): 52 — ABNORMAL LOW
GFR CALC NON AF AMER: 45 — AB
Glucose: 73 mg/dL
POTASSIUM: 4.1 mmol/L
SODIUM: 140 mmol/L

## 2015-01-24 MED ORDER — PANTOPRAZOLE SODIUM 40 MG PO TBEC
40.0000 mg | DELAYED_RELEASE_TABLET | Freq: Every day | ORAL | Status: DC
  Administered 2015-01-25 – 2015-02-02 (×8): 40 mg via ORAL
  Filled 2015-01-24 (×8): qty 1

## 2015-01-24 MED ORDER — POTASSIUM CHLORIDE IN NACL 20-0.9 MEQ/L-% IV SOLN: INTRAVENOUS | Status: DC

## 2015-01-24 MED ORDER — ONDANSETRON HCL 4 MG/2ML IJ SOLN: 4.0000 mg | INTRAMUSCULAR | Status: DC | PRN

## 2015-01-24 MED ORDER — HYDROCHLOROTHIAZIDE 25 MG PO TABS
25.0000 mg | ORAL_TABLET | Freq: Every day | ORAL | Status: DC
  Administered 2015-01-25 – 2015-02-02 (×9): 25 mg via ORAL
  Filled 2015-01-24 (×10): qty 1

## 2015-01-24 MED ORDER — LISINOPRIL 20 MG PO TABS
40.0000 mg | ORAL_TABLET | Freq: Every day | ORAL | Status: DC
  Administered 2015-01-25 – 2015-02-02 (×8): 40 mg via ORAL
  Filled 2015-01-24 (×9): qty 2

## 2015-01-24 MED ORDER — POLYETHYLENE GLYCOL 3350 17 G PO PACK
17.0000 g | PACK | Freq: Every day | ORAL | Status: DC | PRN
  Administered 2015-01-25: 09:00:00 17 g via ORAL
  Filled 2015-01-24 (×3): qty 1

## 2015-01-24 MED ORDER — VENLAFAXINE HCL ER 75 MG PO CP24
150.0000 mg | ORAL_CAPSULE | Freq: Every day | ORAL | Status: DC
  Administered 2015-01-25 – 2015-01-26 (×2): 150 mg via ORAL
  Filled 2015-01-24 (×2): qty 2

## 2015-01-24 MED ORDER — HYDROMORPHONE HCL 1 MG/ML IJ SOLN: 1.0000 mg | INTRAMUSCULAR | Status: DC | PRN

## 2015-01-24 MED ORDER — ALPRAZOLAM 1 MG PO TABS
1.0000 mg | ORAL_TABLET | Freq: Three times a day (TID) | ORAL | Status: DC
  Administered 2015-01-25 – 2015-02-02 (×24): 1 mg via ORAL
  Filled 2015-01-24 (×24): qty 1

## 2015-01-24 MED ORDER — SENNOSIDES-DOCUSATE SODIUM 8.6-50 MG PO TABS
1.0000 | ORAL_TABLET | Freq: Two times a day (BID) | ORAL | Status: DC
  Administered 2015-01-25 – 2015-02-02 (×13): 1 via ORAL
  Filled 2015-01-24 (×14): qty 1

## 2015-01-24 MED ORDER — POTASSIUM CHLORIDE IN NACL 20-0.9 MEQ/L-% IV SOLN
INTRAVENOUS | Status: DC
  Administered 2015-01-25 – 2015-02-02 (×12): via INTRAVENOUS
  Filled 2015-01-24 (×15): qty 1000

## 2015-01-24 MED ORDER — HYDROMORPHONE HCL 1 MG/ML IJ SOLN
1.0000 mg | INTRAMUSCULAR | Status: DC | PRN
  Administered 2015-01-25 – 2015-01-26 (×2): 1 mg via INTRAVENOUS
  Filled 2015-01-24 (×2): qty 1

## 2015-01-24 MED ORDER — SODIUM CHLORIDE 0.9 % IJ SOLN
3.0000 mL | INTRAMUSCULAR | Status: DC | PRN
  Administered 2015-01-25: 16:00:00 6 mL via INTRAVENOUS
  Filled 2015-01-24: qty 10

## 2015-01-24 MED ORDER — HYDROMORPHONE HCL 2 MG PO TABS: 2.0000 mg | ORAL_TABLET | ORAL | Status: DC | PRN

## 2015-01-24 MED ORDER — LACTULOSE 10 GM/15ML PO SOLN
10.0000 g | Freq: Two times a day (BID) | ORAL | Status: DC
  Administered 2015-01-25: 10:00:00 10 g via ORAL
  Filled 2015-01-24: qty 30

## 2015-01-24 MED ORDER — OLANZAPINE 5 MG PO TABS
5.0000 mg | ORAL_TABLET | Freq: Every day | ORAL | Status: DC
  Administered 2015-01-25: 5 mg via ORAL
  Filled 2015-01-24 (×2): qty 1

## 2015-01-24 MED ORDER — FENTANYL 50 MCG/HR TD PT72
50.0000 ug | MEDICATED_PATCH | TRANSDERMAL | Status: DC
  Administered 2015-01-26 – 2015-02-01 (×3): 50 ug via TRANSDERMAL
  Filled 2015-01-24 (×3): qty 1

## 2015-01-24 MED ORDER — HYDROMORPHONE HCL 2 MG PO TABS
2.0000 mg | ORAL_TABLET | ORAL | Status: DC | PRN
  Administered 2015-01-25 – 2015-01-26 (×3): 2 mg via ORAL
  Filled 2015-01-24 (×3): qty 1

## 2015-01-25 DIAGNOSIS — R109 Unspecified abdominal pain: Secondary | ICD-10-CM

## 2015-01-25 LAB — CBC WITH DIFFERENTIAL/PLATELET
Basophils Absolute: 0 10*3/uL (ref 0–0.1)
Basophils Relative: 0 %
Eosinophils Absolute: 0.1 10*3/uL (ref 0–0.7)
Eosinophils Relative: 2 %
HEMATOCRIT: 22.8 % — AB (ref 35.0–47.0)
Hemoglobin: 7.7 g/dL — ABNORMAL LOW (ref 12.0–16.0)
LYMPHS ABS: 0.8 10*3/uL — AB (ref 1.0–3.6)
MCH: 35.3 pg — ABNORMAL HIGH (ref 26.0–34.0)
MCHC: 33.6 g/dL (ref 32.0–36.0)
MCV: 104.9 fL — ABNORMAL HIGH (ref 80.0–100.0)
Monocytes Absolute: 0.6 10*3/uL (ref 0.2–0.9)
Neutro Abs: 2.7 10*3/uL (ref 1.4–6.5)
Platelets: 105 10*3/uL — ABNORMAL LOW (ref 150–440)
RBC: 2.17 MIL/uL — ABNORMAL LOW (ref 3.80–5.20)
RDW: 14.8 % — ABNORMAL HIGH (ref 11.5–14.5)
WBC: 4.2 10*3/uL (ref 3.6–11.0)

## 2015-01-25 LAB — POTASSIUM: POTASSIUM: 4.9 mmol/L (ref 3.5–5.1)

## 2015-01-25 LAB — MAGNESIUM: Magnesium: 1.6 mg/dL — ABNORMAL LOW (ref 1.7–2.4)

## 2015-01-25 MED ORDER — LACTULOSE 10 GM/15ML PO SOLN
20.0000 g | Freq: Two times a day (BID) | ORAL | Status: DC
  Administered 2015-01-25 – 2015-02-02 (×13): 20 g via ORAL
  Filled 2015-01-25 (×15): qty 30

## 2015-01-25 MED ORDER — MAGNESIUM SULFATE IN D5W 10-5 MG/ML-% IV SOLN
1.0000 g | Freq: Once | INTRAVENOUS | Status: AC
  Administered 2015-01-25: 1 g via INTRAVENOUS
  Filled 2015-01-25: qty 100

## 2015-01-25 NOTE — Progress Notes (Signed)
RN notified

## 2015-01-25 NOTE — Progress Notes (Signed)
[facility]cer Center Physician Progress Note  [Authored: 30-Apr-16 14:32]- for Visit: [id number], Complete, Entered, Signed in Full, General THIS IS 5/1 NOTE Chief Complaint:  Fever No   Cough No   Pain Yes   Constipation Yes,, DID HAVE SMALL SMUDGE OF STOOL MIXED WITH BLEEDING HEMORRHOIDS   Nausea No   Vomiting No   Appetite Fair   Review of Systems:  General fatigue   HEENT no complaints   Lungs no complaints   Cardiac no complaints   GU no complaints   Skin no complaints   Neuro no complaints, HAD SOME HEADACHE EARLIER   Review of Systems   has been ambuklating in room  Physical Exam:  HEENT: Normal,    Lungs: clear   Abdomen: nontender   Neuro: AAOx3   Physical Exam , INTERMITTENT AGITATED,    Labs:  Laboratory Results: Routine Chem:  30-Apr-16 04:40   Glucose, Serum 73 (65-99 NOTE: New Reference Range  12/02/14)  BUN 12 (6-20 NOTE: New Reference Range  12/02/14)  Creatinine (comp)  1.17 (0.44-1.00 NOTE: New Reference Range  12/02/14)  Sodium, Serum 140 (135-145 NOTE: New Reference Range  12/02/14)  Potassium, Serum 4.1 (3.5-5.1 NOTE: New Reference Range  12/02/14)  Chloride, Serum 111 (101-111 NOTE: New Reference Range  12/02/14)  CO2, Serum 24 (22-32 NOTE: New Reference Range  12/02/14)  Calcium (Total), Serum  8.5 (8.9-10.3 NOTE: New Reference Range  12/02/14)  Anion Gap  5  eGFR (African American)  52  eGFR (Non-African American)  45 (eGFR values <44m/min/1.73 m2 may be an indication of chronic kidney disease (CKD). Calculated eGFR is useful in patients with stable renal function. The eGFR calculation will not be reliable in acutely ill patients when serum creatinine is changing rapidly. It is not useful in patients on dialysis. The eGFR calculation may not be applicable to patients at the low and high extremes of body sizes, pregnant women, and vegetarians.)  Routine Hem:  30-Apr-16 04:40   WBC (CBC) 3.8  RBC (CBC)  2.15    Hemoglobin (CBC)  7.7  Hematocrit (CBC)  22.3  Platelet Count (CBC)  118  MCV  104  MCH  35.6  MCHC 34.4  RDW 14.4  Neutrophil % 50.3  Lymphocyte % 34.4  Monocyte % 12.6  Eosinophil % 2.1  Basophil % 0.6  Neutrophil # 1.9  Lymphocyte # 1.3  Monocyte # 0.5  Eosinophil # 0.1  Basophil # 0.0 (Result(s) reported on 24 Jan 2015 at 05:12AM.)    Assessment OVARIAN CANCER ABDOMINAL PAIN CONSTIPATION PAIN IMPROVED BUT STILL ON IV DILAUDID, LESS OFTEN . POSITIVE FLATUS ONE VERY SMALL BM ABDO XRAYS STABLE LARGE STOOL BURDEN. ANENIA FURTHUR UNMASKED BY HYDRATION, NOT OVERTLY SYMPTOMATIC OF ANEMIA. LOW MG AT 1.6 TODAY .PLAN REDUCE IV FLUIDS, ADVANCDE DIET, CONTINUE COLACE, SENNA, AND INCREASE DOSES  LACTULOSE, TRY AND WEAN OFF IV TO PO DILAUDID   Electronic Signatures: GDallas Schimke(MD)  (Signed 30-Apr-16 14:41)  Authored: Chief Complaints, Examination, Labs, Assessment   Last Updated: 30-Apr-16 14:41 by GDallas Schimke(MD)

## 2015-01-25 NOTE — Plan of Care (Signed)
Problem: Discharge Progression Outcomes Goal: Other Discharge Outcomes/Goals Outcome: Progressing Pain controlled with dilaudid IV x 1 and dilaudid pox1 with primary site of pain frontal HA with pain controlled well. Magnesium 1.6 with IV supplement given. HGB stable at 7.7. Pt refused TEDS with expectations to be discharged home tomorrow. Dgt, [name], in and spoke with discharge planner regarding home care situation. Pt experiencing constipation bleeding hemorrhoids with MD staffing with no results from mirilax and other laxatives with lactulose dose to increase tonight.Pt having altered mental status with limited memory recall/time span/situation. Eating small amounts of full liquid/tolerating well. Up to [facility] with 1+ with generalized weakness/unsteady gait.

## 2015-01-25 NOTE — H&P (Signed)
PATIENT NAME:  Jessica Avery, Jessica Avery MR#:  [id number] DATE OF BIRTH:  08-22-1936  DATE OF ADMISSION:  01/17/2015  REFERRING PHYSICIAN: Briant Sites. Joni Fears, MD  PRIMARY CARE PHYSICIAN: Cheral Marker. Ola Spurr, MD  CHIEF COMPLAINT: Abdominal pain.   HISTORY OF PRESENT ILLNESS: A 79 year old Caucasian female with past medical history of ovarian cancer on chemotherapy provide by Dr. Oliva Bustard. She had chemotherapy treatment approximately 4 days prior to admission. Since that time period, has been complaining of nausea, vomiting, diarrhea, abdominal pain. There is some difficulty providing an adequate; however, states abdominal pain, which is chronic has been worsening over the entirety of her abdomen, no worsening or relieving factors. She has p.o. intolerance, stating abdominal pain worsened by p.o. intake, subsequently has had p.o. intolerance with nausea, vomiting, and continuous diarrhea since that time. Also, of note, has been complaining of dysuria for the last 2-3 days' duration with increased urinary frequency. In regard to abdominal pain, she states it as only "pain." Intensity is somewhere between 6/10.   REVIEW OF SYSTEMS:  CONSTITUTIONAL: Denies fevers, chills. Positive for fatigue, weakness.  EYES: Denies blurred vision, double vision, eye pain. EARS, NOSE, AND THROAT: Denies tinnitus, ear pain, hearing loss.  RESPIRATORY: Denies cough, wheeze, shortness of breath.  CARDIOVASCULAR: Denies chest pain, palpitations, edema.  GASTROINTESTINAL: Positive for nausea, vomiting, diarrhea, abdominal pain, as stated above.  GENITOURINARY: Positive for dysuria as well as increased urinary frequency. Denies hematuria.  ENDOCRINE: Denies nocturia or thyroid problems.  HEMATOLOGIC AND LYMPHATIC: Denies easy bruising or bleeding.  SKIN: Denies rash or lesion.  MUSCULOSKELETAL: Denies pain in neck, back, shoulder, knees, hips, or arthritic symptoms.  NEUROLOGIC: Denies paralysis or paresthesias.  PSYCHIATRIC:  Denies anxiety or depressive symptoms. Otherwise, a full review of systems performed by me is negative.   PAST MEDICAL HISTORY: Includes type 2 diabetes, non-insulin-requiring; gastroesophageal reflux disease without esophagitis; history of ovarian cancer.   SOCIAL HISTORY: Remote tobacco use. No alcohol or drug use.   FAMILY HISTORY: No known cardiovascular or pulmonary disorders.   ALLERGIES: MACROBID, MORPHINE, TEQUIN.   HOME MEDICATIONS: Include acetaminophen/hydrocodone 325/10 mg 2 tablets p.o. q. 4 hours; fentanyl 50 mcg transdermal q. 72 hours; lisinopril 40 mg p.o. daily; venlafaxine 150 mg p.o. daily; Zofran 4 mg p.o. q. 6 hours as needed for nausea, vomiting; alprazolam 0.5 mg p.o. q. 8 hours as needed for anxiety; pindolol 5 mg p.o. daily; hydrochlorothiazide 25 mg p.o. daily; Integra vitamin B complex 1 capsule daily; Colace 100 mg p.o. daily; MiraLax 17 grams daily; pantoprazole 40 mg p.o. b.i.d. She was started on Cipro for UTI; however, she has not actually taken any antibiotics thus far.   PHYSICAL EXAMINATION:  VITAL SIGNS: Temperature 98.8, heart rate 87, respirations 18, blood pressure 178/79, saturating 98% on room air. Weight 58.5 kg, BMI 21.5.  GENERAL: Weak, chronically ill-appearing Caucasian female currently in no acute distress.  HEAD: Normocephalic, atraumatic.  EYES: Pupils equal, round, reactive to light. Extraocular muscles are intact. No scleral icterus.  MOUTH: Dry mucosal membrane. Dentition intact. No abscess noted.  EARS, NOSE, AND THROAT: Clear without exudates. No external lesions.  NECK: Supple. No thyromegaly. No nodules. No JVD.  PULMONARY: Clear to auscultation bilaterally without wheezes, rales, rhonchi. No use of accessory muscles. Good respiratory effort.  CHEST: Nontender to palpation.  CARDIOVASCULAR: S1, S2, regular rate and rhythm. No murmurs, rubs, or gallops. No edema. Pedal pulses 2+ bilaterally. GASTROINTESTINAL: Soft, currently nontender to  palpation, nondistended. Hypoactive bowel sounds. No hepatosplenomegaly.  MUSCULOSKELETAL:  No swelling, clubbing, edema. Range of motion full in all extremities.   NEUROLOGIC: Cranial nerves II-XII intact. No gross focal neurological deficit. Sensation intact. Reflexes intact.  SKIN: No ulcerations, lesions, rashes, cyanosis. Skin warm and dry. Turgor intact.  PSYCHIATRIC: Mood, affect within normal limits. The patient is awake, alert, oriented x 3. Insight and judgment intact.   LABORATORY DATA: Chest x-ray performed: No acute cardiopulmonary process. CT of the pelvis performed and findings compatible with ileus/gastroenteritis. Remainder of laboratory data: Sodium of 138, potassium 3.5, chloride 103, bicarbonate 25, BUN 40, creatinine 1.26, glucose of 113, magnesium 1.5. LFTs: Protein 6.1, albumin 3.3, bilirubin 0.9, alkaline phosphatase 48, AST 21, ALT 13. WBC 3.8, hemoglobin 10.1, platelets 97,000. Urinalysis: WBCs 6-30, leukocyte esterase 1+.   ASSESSMENT AND PLAN: A 79 year old Caucasian female with a history of ovarian cancer, abdominal pain, nausea, vomiting.  1.  Intractable abdominal pain secondary to ovarian cancer as well as gastritis. Provide pain medications as required. Intravenous fluid hydration. Continue with supportive care.  2.  Hypomagnesemia. Replace magnesium to a goal of 2.  3.  Urinary tract infection, site unspecified. Ceftriaxone.  4.  Gastroesophageal reflux disease without esophagitis. Proton pump inhibitor therapy. 5.  Hypertension, essential. Lisinopril. 6.  Venous thromboembolism prophylaxis. Heparin subcutaneous.   CODE STATUS: The patient is full code.   TIME SPENT: 45 minutes.    ____________________________ Aaron Mose. Hower, MD dkh:bm D: 01/18/2015 02:14:02 ET T: 01/18/2015 02:44:47 ET JOB#: [id number]  cc: Aaron Mose. Hower, MD, <Dictator> DAVID Woodfin Ganja MD ELECTRONICALLY SIGNED 01/18/2015 22:44

## 2015-01-25 NOTE — Plan of Care (Signed)
Problem: Discharge Progression Outcomes Goal: Discharge plan in place and appropriate Individualization of care Address patient as Jessica Avery. Patient received chemotherapy 01/14/15. Patient is a high falls risk, offer toileting every hour. Medical history includes GERD, depression, controlled with home medication and ovarian cancer. Goal: Other Discharge Outcomes/Goals Outcome: Progressing Plan of care progress to goals: Pain: received PRN pain meds with relief Hemo stable: no signs or symptoms of bleeding  Complications: new diagnosis of Ovarian Cancer, receiving chemo, last BM 01/20/2015, lactulose scheduled. Tolerating diet: full liquid 100% consumed  Activity: Generalized weakness, 1 person assist

## 2015-01-25 NOTE — Discharge Summary (Signed)
PATIENT NAME:  Jessica Avery, WINKELS MR#:  [id number] DATE OF BIRTH:  [birth year]  DATE OF ADMISSION:  01/17/2015 DATE OF DISCHARGE:  01/19/2015  PRIMARY CARE PHYSICIAN:  Cheral Marker. Ola Spurr, MD    DISCHARGE DIAGNOSES: Acute gastroenteritis, dehydration, urinary tract infection, pancytopenia, hypertension, colon cancer.   CONDITION: Stable.   CODE STATUS: Full code.   HOME MEDICATIONS: Please refer to the medication reconciliation list.   DIET: Low-sodium diet.   ACTIVITY: As tolerated. The patient needs home health with PT and nurse.   FOLLOWUP CARE: Follow up with PCP and Dr. Oliva Bustard within 1 to 2 weeks.   REASON FOR ADMISSION: Abdominal pain.   HOSPITAL COURSE: The patient is a 79 years old Caucasian female with a history of ovarian cancer, on chemotherapy, came to ED due to nausea, vomiting, diarrhea, and abdominal pain. For detailed history and physical examination, please refer to the admission note dictated by Dr. Lavetta Nielsen. On admission date, the patient's laboratory data showed BUN 40, creatinine 1.26, magnesium 1.5, potassium 3.5. Urinalysis showed WBC 6 to 30.   1.  For acute gastroenteritis, after admission the patient was kept n.p.o. with IV fluid support and Zofran p.r.n. The patient's nausea, vomiting got better and the patient was started with clear liquids and then advanced to full liquids. The patient tolerated full liquids well and we advanced diet to 2 gram sodium diet.  2.  For hypomagnesemia, the patient was given magnesium IV and magnesium level is normal.  3.  For UTI, the patient was treated with Rocephin. We will change to Cipro p.o.   Generally the patient's symptoms are getting better. She is clinically stable; will be discharged to home with home health and PT today. Discussed the patient's discharge plan with the patient and patient's son, nurse, case manager, and Dr. Oliva Bustard.   TIME SPENT: About 42 minutes.    ____________________________ Demetrios Loll,  MD qc:AT D: 01/19/2015 16:23:44 ET T: 01/19/2015 23:10:36 ET JOB#: [id number]  cc: Demetrios Loll, MD, <Dictator> Demetrios Loll MD ELECTRONICALLY SIGNED 01/20/2015 15:12

## 2015-01-25 NOTE — Progress Notes (Signed)
TC page returned from Dr. Inez Pilgrim with Magnesium level of 1.6 given.  States he will review upon rounds.

## 2015-01-25 NOTE — Care Management Note (Signed)
Case Management Note  Patient Details  Name: TUESDAY TERLECKI MRN: [id number] Date of Birth: 12-May-1936  Subjective/Objective:                    Action/Plan: Provided Ms Grantz's daughter with a list of sitters/home health aides per her request. Mrs Khamis's son is currently caring for her at home but the family wants to explore obtaining additional in-home assistance.   Expected Discharge Date:   01/26/15               Expected Discharge Plan:     In-House Referral:     Discharge planning Services     Post Acute Care Choice:    Choice offered to:     DME Arranged:    DME Agency:     HH Arranged:    HH Agency:     Status of Service:     Medicare Important Message Given:    Date Medicare IM Given:    Medicare IM give by:    Date Additional Medicare IM Given:    Additional Medicare Important Message give by:     If discussed at [place] of Stay Meetings, dates discussed:    Additional Comments:  Alexandria Shiflett A, RN 01/25/2015, 4:28 PM

## 2015-01-25 NOTE — Progress Notes (Addendum)
For prior documentation please see [facility] MRN# [id number]

## 2015-01-25 NOTE — Progress Notes (Signed)
Pt mildly lethargic/sedated;sleeps at frequent intervals;arouses readily. Reports lower abdominal pain and HA today controlled by hydromorphone. Magnesium IV supplement given. Staffed with Dr. Inez Pilgrim regarding constipation with small amount bleeding from hemorrhoids despite miralax addition this a.m with new orders obtained.

## 2015-01-25 NOTE — Consult Note (Addendum)
PATIENT NAME:  Jessica Avery, Jessica Avery MR#:  [id number] DATE OF BIRTH:  [birth year]  DATE OF CONSULTATION:  01/23/2015  REFERRING PHYSICIAN:   CONSULTING PHYSICIAN:  Tnya Ades K. Masiya Claassen, MD  SUBJECTIVE: The patient was seen lying in bed. She is a very good historian. She reports that she has never worked and she is widowed for a few years and her 77 year old son lives with her and recently he lost his job. The patient and her son live in a house. The patient reports that she has long history of depression and was being followed by Dr. Lanetta Inch for 30 years and quit going to see a psychiatrist since he retired because she felt that she never got better from her depression.   CHIEF COMPLAINT: "I have cancer and I'm depressed and I'm here for help."  PAST PSYCHIATRIC HISTORY: She was an inpatient on psychiatry once many years ago under the care of Dr. Thurmond Butts and she was inpatient for 2 months. No history of suicide attempt. Was being followed by Dr. Lanetta Inch until he retired and currently she gets her medications from her primary care physician. Currently the patient is on Effexor XR 150 mg p.o. daily, which is not help her.   OBJECTIVE: The patient was seen lying in bed, alert and oriented, calm and cooperative. Affect is flat. Mood is depressed. Feeling low and down and frustrated and depressed and irritable about having to fight with her ovarian cancer all of her life.  Cognition intact. No psychosis. Does not appear to respond to any stimuli. Denies suicidal or homicidal  plans. Insight and judgment guarded. Impulse control is fair.   IMPRESSION: Major depression, recurrent.  RECOMMENDATIONS: Add Zyprexa 5 mg p.o. at bedtime to Effexor so that it will act as an adjunct for better control her depression.   ____________________________ Wallace Cullens. Franchot Mimes, MD skc:sb D: 01/23/2015 15:44:01 ET T: 01/23/2015 16:26:12 ET JOB#: [id number]  cc: Arlyn Leak K. Franchot Mimes, MD, <Dictator> Dewain Penning MD ELECTRONICALLY SIGNED  01/24/2015 19:01

## 2015-01-25 NOTE — Consult Note (Signed)
PATIENT NAME:  Jessica Avery, Jessica Avery MR#:  [id number] DATE OF BIRTH:  Apr 25, 1936  DATE OF CONSULTATION:  01/18/2015  REFERRING PHYSICIAN:     Aaron Mose. Hower, MD CONSULTING PHYSICIAN:  Jazmine Heckman R. Ma Hillock, MD  REASON FOR CONSULTATION:  Advanced ovarian cancer with peritoneal carcinomatosis presenting with nausea, vomiting, weakness.   HISTORY OF PRESENT ILLNESS:  The patient is a 79 year old female with a past medical history significant for advanced ovarian cancer, on treatment by Dr. Oliva Bustard. The patient most recently received chemotherapy with Avastin and Taxol on April 20. She has been admitted with complaints of abdominal pain, nausea, vomiting, and some diarrhea. CT scan done this time was suggestive of ileus. Recent tumor marker was lower than last time at around 86. The patient also states that she had some dysuria and had an abnormal UA on Wednesday, but was not aware of a  Cipro prescription being called in and did not take it yet. She is currently on Rocephin antibiotic. States that she is feeling slightly better overall, still has intermittent nausea and some abdominal discomfort. She is passing flatus. Oral intake is poor.   PAST MEDICAL AND SURGICAL HISTORY:  As in HPI above. In addition, she has type 2 diabetes, GERD, history of ovarian cancer as above.   FAMILY HISTORY:  Noncontributory.   SOCIAL HISTORY:  Remote smoking history. No alcohol usage.   ALLERGIES:  INCLUDE MORPHINE, TEQUIN, MACROBID.   HOME MEDICATIONS: Norco 325/10 mg 2 tablets q. 4 hours p.r.n., fentanyl 50 mcg transdermal q. 72 hours, lisinopril 40 mg daily, Zofran 4 mg q. 6 hours p.r.n., alprazolam 0.5 mg q. 8 hours p.r.n., pindolol 5 mg daily, hydrochlorothiazide 25 mg daily, venlafaxine 150 mg p.o. daily, multivitamin 1 daily, Colace 100 mg daily, MiraLax 17 grams daily, pantoprazole 40 mg b.i.d.   REVIEW OF SYSTEMS:  CONSTITUTIONAL:  Significant weakness, poor oral intake. Recent fever, but none today. Currently denies  chills.  HEENT:  Denies any dizziness or headaches. No epistaxis, ear, or jaw pain. No sinus symptoms.  CARDIAC:  No angina, palpitations, orthopnea, or PND.  LUNGS:  Has dyspnea on exertion. No new cough or sputum. No hemoptysis.  GASTROINTESTINAL:  As in HPI.  GENITOURINARY:  No dysuria or hematuria. Urine output less.  SKIN:  No new rashes or pruritus.  HEMATOLOGIC:  Denies bleeding issues.  NEUROLOGIC:  No new focal weakness or seizures.  ENDOCRINE:  No polyuria or polydipsia.   PHYSICAL EXAMINATION:  GENERAL:  The patient is weak and tired-looking, otherwise sitting in bed, alert and oriented and converses appropriately. No acute distress. Mild pallor. No icterus.  VITAL SIGNS:  Temperature 98.3, heart rate 100, respiratory rate 20, blood pressure 187/92, oxygen saturation 96% on room air.  HEENT:  Normocephalic, atraumatic. Extraocular movements intact. Sclerae anicteric.  NECK:  Negative for lymphadenopathy.  CARDIOVASCULAR:  S1, S2, regular.  LUNGS:  Show bilateral diminished breath sounds at bases.  ABDOMEN:  Soft, mildly distended, mild tenderness present in the upper quadrants. No guarding or rigidity. Bowel sounds seem increased.  EXTREMITIES:  Show mild edema.  SKIN:  Shows no generalized rashes or major bruising.  NEUROLOGIC:  Limited exam. Cranial nerves seem intact. Moves all extremities spontaneously.   LABORATORY DATA:  Stool:  Clostridium difficile negative. WBC 3400, hemoglobin 9.3, platelets 86,000, ANC 2700. Creatinine 1.13, BUN 38, potassium 3, bilirubin 0.4. Liver enzymes unremarkable. Albumin low at 2.9, calcium 8.3. UA shows 1+ blood, 1+ leukocyte esterase, 6 to 30 WBCs.   IMPRESSION  AND RECOMMENDATIONS:  A 79 year old female patient with history of advanced ovarian cancer on chemotherapy, most recently received Taxol and Avastin on April 20. Currently admitted with persistent gastrointestinal symptoms along with urinary tract infection. Repeat CT abdomen and pelvis  on April 23 reports findings compatible with ileus/gastroenteritis, large and small bowel dilated with air-fluid levels and diffuse fluid-filled colon. Right adnexal mass 3.8 x 3.0 cm stable and unchanged from prior study, omental nodules slightly improved. Agree with ongoing empiric antibiotic treatment for urinary tract infection, supportive treatment, and continue pain control. The patient seems to be tolerating a clear liquid diet better today. Continue antiemetics as needed. CBC shows pancytopenia, likely recent chemotherapy side effect. Continue to monitor CBC and differential is. Will request primary oncologist, Dr. Oliva Bustard, to follow up tomorrow onwards and make further plan of management.   Thank you for the referral. Please feel to contact me with any additional questions.    ____________________________ Rhett Bannister Ma Hillock, MD srp:TT D: 01/18/2015 22:54:00 ET T: 01/18/2015 23:51:01 ET JOB#: [id number]  cc: Destynee Stringfellow R. Ma Hillock, MD, <Dictator> Alveta Heimlich MD ELECTRONICALLY SIGNED 01/19/2015 9:21

## 2015-01-26 ENCOUNTER — Encounter: Payer: Self-pay | Admitting: Internal Medicine

## 2015-01-26 ENCOUNTER — Other Ambulatory Visit: Payer: Self-pay | Admitting: Oncology

## 2015-01-26 DIAGNOSIS — C569 Malignant neoplasm of unspecified ovary: Secondary | ICD-10-CM

## 2015-01-26 MED ORDER — DESVENLAFAXINE SUCCINATE ER 50 MG PO TB24
100.0000 mg | ORAL_TABLET | Freq: Every day | ORAL | Status: DC
  Administered 2015-01-26 – 2015-01-27 (×2): 100 mg via ORAL
  Administered 2015-01-28 (×2): 50 mg via ORAL
  Administered 2015-01-30 – 2015-02-02 (×4): 100 mg via ORAL
  Filled 2015-01-26 (×9): qty 2
  Filled 2015-01-26: qty 1

## 2015-01-26 MED ORDER — LORAZEPAM 2 MG/ML IJ SOLN
1.0000 mg | Freq: Once | INTRAMUSCULAR | Status: AC
  Administered 2015-01-27: 09:00:00 1 mg via INTRAVENOUS
  Filled 2015-01-26: qty 1

## 2015-01-26 MED ORDER — NON FORMULARY: 100.0000 mg | Freq: Every day | Status: DC

## 2015-01-26 MED ORDER — HALOPERIDOL LACTATE 5 MG/ML IJ SOLN
1.0000 mg | Freq: Once | INTRAMUSCULAR | Status: AC
  Administered 2015-01-26: 11:00:00 1 mg via INTRAVENOUS
  Filled 2015-01-26: qty 1

## 2015-01-26 MED ORDER — LORAZEPAM 2 MG/ML IJ SOLN
1.0000 mg | Freq: Three times a day (TID) | INTRAMUSCULAR | Status: DC | PRN
  Administered 2015-01-26 – 2015-01-27 (×2): 1 mg via INTRAVENOUS
  Filled 2015-01-26 (×2): qty 1

## 2015-01-26 NOTE — H&P (Addendum)
PATIENT NAME:  Jessica Avery, Jessica Avery MR#:  [id number] DATE OF BIRTH:  October 27, 1935  DATE OF ADMISSION:  01/23/2015  HISTORY OF PRESENT ILLNESS:  Jessica Avery is a 79 year old patient primarily followed by Dr. Oliva Bustard, seen today in the clinic and then again on the floor, also seen and evaluated by nurse practitioner in the clinic earlier today. The patient is admitted with recurrent and currently uncontrolled abdominal pain requiring parenteral narcotics, also ileus, weakness, nausea, inability to take in full p.o. intake. The patient has a significant history of ovarian cancer. Most recently as summarized during a recent hospitalization, the patient only going home 4 days ago, the patient was hospitalized with abdominal pain and a urinary tract infection, she had an ileus. CT scan with contrast was suggestive of an ileus. There were some omental nodules that were felt to be smaller than on the previous scan, the most recent tumor marker was down from the previous. The patient had constipation, had not had a bowel movement prior to discharge and had not had one since discharge although she has been able to pass flatus. Appetite is continually down. Took small amounts of clear liquids last night and earlier today. No vomiting. Some belching. Pain was worse yesterday and was in the clinic for fluids and intravenous Dilaudid, then home on oral hydrocodone. Symptoms are unchanged and the pain is steady to worse today, so the patient is hospitalized. Her additional history includes GERD, prior hysterectomy, ovarian cancer, initial Taxol and carboplatin treatment, recently Taxol and Avastin salvage therapy, the last dose on April 20.   ALLERGIES: MORPHINE, TEQUIN, AND MACROBID.   FAMILY HISTORY: Noncontributory.   SOCIAL HISTORY: No alcohol. Smoked in the past.   MEDICATIONS: Recently on Norco 325/10 mg 2 tablets every 4 hours p.r.n., fentanyl 50 mcg patch every 72 hours, lisinopril 40 mg daily, Zofran 4 mg q. 6 hours  p.r.n., alprazolam recently 0.5 mg but prior had been on 1 mg every 8 hours around the clock for many years, pindolol 5 mg daily, hydrochlorothiazide 25 mg daily, venlafaxine 150 mg daily, multivitamins daily, Colace 100 mg daily, MiraLax p.o. daily 17 grams, and pantoprazole 40 b.i.d.    ADDITIONAL HISTORY: Anxiety and depression for many years.     SYSTEM REVIEW: General weakness, decreased p.o. intake as stated, had had fever prior to last admission, no fever since discharge. No headache. No dizziness. Some general weakness. No ear or jaw pain.  Some belching, some bloating. No vomiting. Mid and diffuse abdominal pain, sometimes radiating to the back. Has passed small amounts of flatus. No bowel movement since discharge from the hospital now more than 4 days. No dysuria or hematuria. No edema. No bone pain. No bruising.   PHYSICAL EXAMINATION:  GENERAL: Alert, cooperative. Affect flat. Pallor.  HEENT: Sclerae clear. No thrush in the mouth.  VITAL SIGNS: Stable as recorded.  LUNGS: Decreased air entry, possibly minimally decreased at the bases. No dullness.  ABDOMEN: Firm. Slight distention, slight tenderness in the epigastrium. No guarding, no rigidity. Bowel sounds were decreased.  EXTREMITIES: Trace symmetric pretibial edema.  NEUROLOGIC: Grossly nonfocal. Alert, cooperative. Moved  all extremities against gravity. I did not test her gait.   LABORATORY DATA: A 3-way abdomen and chest exam done today shows gaseous distention of small bowel with appearance of an ileus, unchanged from yesterday x-ray. There was no pulmonary pathology. The white count is 4.5, neutrophils 3.1, hemoglobin 8.4, platelets 124,000. Magnesium was 1.4. Total protein is 5.6. Albumin is 3.2. Liver  function tests are unremarkable. Calcium was 8.6, sodium  potassium 4.0, creatinine 1.35. This compares to a creatinine of 1.27 on April 28 and a hemoglobin of 9.1 on April 28. Creatinine was 1.17 April 25. The hemoglobin was 9.3 on  April 24.     IMPRESSION AND PLAN:  1.  The patient with continued abdominal pain and ileus, no definite sign of obstruction. Pain unresponsive to p.o. medications at home. No significant dehydration, but by history the p.o. intake is inadequate.  2.  There is also anemia, probably more anemia will be unmasked by additional hydration, although fatigue is not overtly symptomatic of anemia, no dizziness, no orthostasis, no palpitations, no shortness of breath at rest.  3.  She has chronic anxiety and depression, she feels her mood is more down, she is increasingly anxious compared to her baseline.   PLAN:  1.  The patient is hospitalized.  2.  We will continue her usual p.o. medicine except stop the p.o. narcotics and substitute IV Dilaudid p.r.n. Note the morphine allergy.  3.  IV fluids initially.  4.  With magnesium level low would give a dose of 2 grams of IV magnesium. 5.  Initially started on saline, we will adjust fluids, we will add some potassium to the maintenance IV.  6.  Consult psychiatry.  7.  X-ray done today as stated.  8.  We will consult surgery.     ____________________________ Simonne Come Inez Pilgrim, MD rgg:bu D: 01/23/2015 17:25:19 ET T: 01/23/2015 17:40:02 ET JOB#: [id number]  cc: Simonne Come. Inez Pilgrim, MD, <Dictator> Dallas Schimke MD ELECTRONICALLY SIGNED 02/10/2015 17:45

## 2015-01-26 NOTE — Progress Notes (Signed)
Pt very impulsive this morning and agitated. Family all at the bedside and pt is unable to be calmed down. MD notified. Haldol given 1x. Limited effect. MD to come at lunch time.

## 2015-01-26 NOTE — Plan of Care (Signed)
Problem: Discharge Progression Outcomes Goal: Discharge plan in place and appropriate Individualization of care  Address patient as Jessica Avery. Patient received chemotherapy 01/14/15. Patient is a high falls risk, offer toileting every hour. Medical history includes GERD, depression, controlled with home medication and ovarian cancer.    Goal: Other Discharge Outcomes/Goals Plan of care progress to goals: Pain: received PRN pain meds with relief Hemo stable: no signs or symptoms of bleeding   Complications: new diagnosis of Ovarian Cancer, receiving chemo, last BM 01/20/2015, lactulose scheduled. Tolerating diet: full liquid 100% consumed   Activity: Generalized weakness, 1 person assist

## 2015-01-26 NOTE — Consult Note (Signed)
Reason for Consult:worsening depression and new delirium Referring Physician : chotski  Jessica Avery is an 79 y.o. female.  HPI: chronic depression and current psychosis most likely delirium  Past Medical History  Diagnosis Date  . Peritoneal carcinomatosis 01/15/2015  . Ovarian cancer   . Diabetes mellitus without complication   . Anemia   . GERD (gastroesophageal reflux disease)   . Irregular cardiac rhythm   . Chronic back pain   . Depression     Past Surgical History  Procedure Laterality Date  . Abdominal hysterectomy    . Appendectomy    . Cholecystectomy    . Heel spur surgery    . Tubal ligation      No family history on file.  Social History:  reports that she has quit smoking. She does not have any smokeless tobacco history on file. Her alcohol and drug histories are not on file.  Allergies:  Allergies  Allergen Reactions  . Macrodantin [Nitrofurantoin] Hives and Itching  . Morphine And Related Other (See Comments)    Ineffective for pain   . Tequin [Gatifloxacin] Other (See Comments)    Unknown reaction    Medications: I have reviewed the patient's current medications.  Results for orders placed or performed during the hospital encounter of 01/23/15 (from the past 48 hour(s))  Magnesium     Status: Abnormal   Collection Time: 01/25/15  4:54 AM  Result Value Ref Range   Magnesium 1.6 (L) 1.7 - 2.4 mg/dL  CBC with Differential/Platelet     Status: Abnormal   Collection Time: 01/25/15  4:19 PM  Result Value Ref Range   WBC 4.2 3.6 - 11.0 K/uL   RBC 2.17 (L) 3.80 - 5.20 MIL/uL   Hemoglobin 7.7 (L) 12.0 - 16.0 g/dL   HCT 22.8 (L) 35.0 - 47.0 %   MCV 104.9 (H) 80.0 - 100.0 fL   MCH 35.3 (H) 26.0 - 34.0 pg   MCHC 33.6 32.0 - 36.0 g/dL   RDW 14.8 (H) 11.5 - 14.5 %   Platelets 105 (L) 150 - 440 K/uL   Neutrophils Relative % 65% %   Neutro Abs 2.7 1.4 - 6.5 K/uL   Lymphocytes Relative 20% %   Lymphs Abs 0.8 (L) 1.0 - 3.6 K/uL   Monocytes Relative 13% %    Monocytes Absolute 0.6 0.2 - 0.9 K/uL   Eosinophils Relative 2% %   Eosinophils Absolute 0.1 0 - 0.7 K/uL   Basophils Relative 0% %   Basophils Absolute 0.0 0 - 0.1 K/uL  Potassium     Status: None   Collection Time: 01/25/15  4:19 PM  Result Value Ref Range   Potassium 4.9 3.5 - 5.1 mmol/L    No results found.  Review of Systems  Constitutional: Positive for weight loss and malaise/fatigue. Negative for fever, chills and diaphoresis.  HENT: Negative for ear pain, hearing loss and tinnitus.   Eyes: Negative for blurred vision and double vision.  Respiratory: Negative.   Cardiovascular: Negative for chest pain, palpitations and orthopnea.  Gastrointestinal: Negative.  Negative for heartburn.  Genitourinary: Positive for frequency.  Skin: Negative for itching and rash.  Neurological: Positive for dizziness, sensory change, focal weakness and weakness. Negative for headaches.  Psychiatric/Behavioral: Positive for depression, suicidal ideas, hallucinations and memory loss. Negative for substance abuse. The patient is nervous/anxious and has insomnia.    Blood pressure 181/90, pulse 99, temperature 99.2 F (37.3 C), temperature source Oral, resp. rate 18, SpO2 96 %.  Physical Exam  Psychiatric: Her mood appears anxious. Her affect is labile. Her speech is delayed and tangential. She is agitated and actively hallucinating. Thought content is paranoid and delusional. Cognition and memory are impaired. She expresses impulsivity and inappropriate judgment. She exhibits a depressed mood. She expresses suicidal ideation. She expresses no suicidal plans. She exhibits abnormal recent memory.    Assessment/Plan: PAtient with history of depression. Currently presenting with psychosis. Agitated and confused. Suicidal iudeation but no plan or intent. Dx major depression severe and recurrent and delirium. Discussed options with family and patient. DC the effexor and start pristiq 100mg  daily and add  prn haldol for delirium. Family resists adding standing antipsychotic.   Jessica Avery 01/26/2015, 12:31 PM

## 2015-01-26 NOTE — Consult Note (Addendum)
PATIENT NAME:  Jessica Avery, Jessica Avery MR#:  [id number] DATE OF BIRTH:  [birth year]  DATE OF CONSULTATION:  01/23/2015  REFERRING PHYSICIAN:   CONSULTING PHYSICIAN:  Loreli Dollar, MD  HISTORY OF PRESENT ILLNESS: This 79 year old female was referred by Dr. Inez Pilgrim for consultation with a chief complaint of abdominal pain. She has a history of ovarian cancer. She had surgery in August 2014 with findings of carcinomatosis and biopsy findings consistent with ovarian origin. She also had a Port-A-Cath inserted and has been treated with chemotherapy. The last dose of chemotherapy was on April 20.   She was recently in the hospital with nausea, vomiting, diarrhea, and weakness. I have reviewed CT scans, which were done when she was in the hospital recently, which did demonstrate some gas in the large and small bowel and appeared to be consistent with ileus. There was a nodule identified in the omentum and approximately 3.5 cm cystic solid mass in the region of the right adnexa. She was discharged home to take a clear liquid diet. She reports that since discharge, she has been taking her clear liquids and has had no further nausea or vomiting. She says she feels that she is tolerating her clear liquids satisfactorily, and actually now feels somewhat hungry. She had 1 bowel movement since discharge, 4 days ago, and it was soft. She has had no constipation. She does continue to pass flatus per rectum. She does report chronic abdominal pain, which is diffuse and has recently been using a Duragesic patch. She reports some of the pain is a gnawing sensation and some is a burning sensation. She reports no associated chills or fever. She reports no rectal bleeding.   PAST MEDICAL HISTORY: 1. Does also include severe depression.  2. History of hypertension. 3. History of chronic gastroesophageal reflux.  4. History of diabetes mellitus.  5. History of chronic anemia.  6. History of chronic back pain, which recently has been  improved.   PAST SURGICAL HISTORY: 1. Has had hysterectomy in 2010.  2. Has had appendectomy.  3. Cholecystectomy.  4. Prior breast surgery.  5. The above-mentioned exploratory laparotomy.  6. Does have Port-A-Cath in the right subclavian area.  7. Has had colonoscopy in 2014, which showed no new mucosal tumors.   MEDICATIONS:  1. Include acetaminophen with hydrocodone 10/325 one or two tablets q.4 h. as needed for pain.  2. Alprazolam 1 mg p.o. 3 times a day as needed for anxiety.  3. Fentanyl 50 mcg patch changed q.3 days. 4. Hydrochlorothiazide 25 mg daily.  5. Integra plus vitamin B complex daily.  6. Lisinopril 40 mg daily.  7. Omeprazole 20 mg daily. 8. Ondansetron 4 mg p.r.n. for nausea.  9. Pindolol 5 mg daily.  10. Venlafaxine 150 mg extended release daily.   DRUG ALLERGIES: MACRODANTIN, MORPHINE, TEQUIN.   SOCIAL HISTORY: Has smoked in the past. Does not drink any alcohol. She is accompanied by her family.   FAMILY HISTORY: Negative for ovarian cancer and colorectal cancer.   REVIEW OF SYSTEMS: She reports that she has had no other recent acute illness such as cough, cold, or sore throat. She has had minimal difficulty swallowing. She does note heartburn in both the epigastric area and also in the upper sternal area. She reports no exertional chest pain. She reports no significant dyspnea on exertion. She does report some moderate difficulty with balance and has been using a walker with a limited amount of walking. She reports she has had a recent  urinary infection, but currently passing her urine satisfactorily. She reports no recent back pain. She reports no recent sores or boils. Review of systems otherwise negative.   PHYSICAL EXAMINATION:  GENERAL: She is awake, alert, and oriented.  VITAL SIGNS: Temperature is 98.4, pulse 75, respirations 19, blood pressure 196/78, pulse oxygen 96%. Subsequent blood pressure 181/82.   SKIN: Pale, warm and dry without rash.  HEENT:  Pupils equal and reactive to light. Extraocular movements are intact. Sclerae are clear. Palpebral conjunctivae are pale. Pharynx is clear.  NECK: No palpable mass.  LUNGS: Sounds were clear with no rales, rhonchi or wheezes.  HEART: Regular rate and rhythm. S1 and S2 without murmur. I noted the site of the Port-A-Cath in the right subclavian area and has been accessed.  ABDOMEN: Soft and flat and nontender with no palpable mass.  RECTAL: Exam demonstrates no palpable mass. No impaction, minimal amount of stool. Presence of sphincter tone.  EXTREMITIES: With no dependent edema.  NEUROLOGIC: Awake, alert, oriented.   CLINICAL DATA: Her creatinine today is 1.35, magnesium is low at 1.4. Liver panel with a low albumin of 3.2. CBC with a white blood count of 4500, hemoglobin 8.4, platelet count 124,000.   I have reviewed her plain films, which demonstrates some gas in the small and large bowel. There also appears to be CT contrast in her distal colon and rectum.   I also reviewed the recent CT images as noted above.   IMPRESSION:  1. History of abdominal carcinomatosis of ovarian origin.  2. Chronic abdominal pain.  3. Chronic anemia.  4. Recent gastroenteritis.  5. Chronic gastroesophageal reflux.   I do not think she needs to have any intestinal surgery at present.   I suggested that she may advance to a full liquid diet and we will start that tonight. I discussed the gastroesophageal reflux and heartburn. She is currently receiving pantoprazole while in the hospital.   I also recommended walking with a walker for balance and exercise.   At some point, there could be a role for a follow-up consultation from gynecologic oncology surgeon.   If her gnawing heartburn persists, this could be a role for a gastroenterology consultation.   She will also anticipate continued chemotherapy.    ____________________________ J. Rochel Brome, MD jws:JT D: 01/23/2015 19:27:41 ET T: 01/23/2015  19:51:04 ET JOB#: [id number]  cc: Loreli Dollar, MD, <Dictator> Loreli Dollar MD ELECTRONICALLY SIGNED 01/26/2015 19:06

## 2015-01-26 NOTE — Progress Notes (Signed)
[facility] [phone]  Fax:([phone]) [place]: September 08, 1936  MR#: [id number]  IEP#:[id number]  No care team member to display  CHIEF COMPLAINT: No chief complaint on file.     Peritoneal carcinomatosis   01/15/2015 Initial Diagnosis Peritoneal carcinomatosis    Oncology Flowsheet 01/25/2015 01/25/2015 01/25/2015 01/26/2015 01/26/2015  ALPRAZolam (XANAX) PO 1 mg 1 mg 1 mg 1 mg 1 mg  OLANZapine (ZYPREXA) PO 5 mg     - -    INTERVAL HISTORY:  Patient was admitted in the hospital with gradually declining condition abdominal pain.  This morning extremely apprehensive and agitated.  Was worried about son getting graduated however patient did not have any grandson getting graduated now.  Finally patient's daughter came been and situation improved.  Patient was given Halder 1 mg IV with some improvement.  Patient was evaluated by psychiatry is MRI scan has been ordered of brain.  Abdominal pain was improved.  Diet was changed to full diet after discussing with Dr. Rochel Brome, surgical consult and REVIEW OF SYSTEMS:   Review of Systems  Constitutional: Negative for fever, chills, weight loss, malaise/fatigue and diaphoresis.  HENT: Negative for congestion, ear discharge, ear pain, hearing loss, nosebleeds, sore throat and tinnitus.   Eyes: Negative for blurred vision, double vision, photophobia, pain, discharge and redness.  Respiratory: Negative for cough, hemoptysis, sputum production, shortness of breath, wheezing and stridor.   Cardiovascular: Negative for chest pain, palpitations, orthopnea, claudication, leg swelling and PND.  Gastrointestinal: Negative for heartburn, nausea, vomiting, abdominal pain, diarrhea, constipation, blood in stool and melena.  Genitourinary: Negative for dysuria, urgency, frequency, hematuria and flank pain.  Musculoskeletal: Negative for myalgias, back pain, joint pain, falls and neck pain.  Skin: Negative for itching and rash.    Neurological: Negative for dizziness, tingling, tremors, sensory change, speech change, focal weakness, seizures, loss of consciousness, weakness and headaches.  Endo/Heme/Allergies: Negative for environmental allergies and polydipsia. Does not bruise/bleed easily.  Psychiatric/Behavioral: Negative for depression, suicidal ideas, hallucinations, memory loss and substance abuse. The patient is not nervous/anxious and does not have insomnia.    patient was very  Agitated ,hallucinating. Abdominal pain has improved.  No nausea.  No vomiting  Had a bowel movement. As per HPI. Otherwise, a complete review of systems is negatve.  PAST MEDICAL HISTORY: Past Medical History  Diagnosis Date  . Peritoneal carcinomatosis 01/15/2015  . Ovarian cancer   . Diabetes mellitus without complication   . Anemia   . GERD (gastroesophageal reflux disease)   . Irregular cardiac rhythm   . Chronic back pain   . Depression     PAST SURGICAL HISTORY: Past Surgical History  Procedure Laterality Date  . Abdominal hysterectomy    . Appendectomy    . Cholecystectomy    . Heel spur surgery    . Tubal ligation      FAMILY HISTORY No family history on file.  GYNECOLOGIC HISTORY:  No LMP recorded.     ADVANCED DIRECTIVES:    HEALTH MAINTENANCE: History  Substance Use Topics  . Smoking status: Former Research scientist (life sciences)  . Smokeless tobacco: Not on file  . Alcohol Use: Not on file     Colonoscopy:  PAP:  Bone density:  Lipid panel:  Allergies  Allergen Reactions  . Macrodantin [Nitrofurantoin] Hives and Itching  . Morphine And Related Other (See Comments)    Ineffective for pain   . Tequin [Gatifloxacin] Other (See Comments)    Unknown  reaction    Current Facility-Administered Medications  Medication Dose Route Frequency Provider Last Rate Last Dose  . 0.9 % NaCl with KCl 20 mEq/ L  infusion   Intravenous Continuous Doctor Chlconversion, MD 60 mL/hr at 01/26/15 1548    . ALPRAZolam (XANAX) tablet 1  mg  1 mg Oral 3 times per day Doctor Chlconversion, MD   1 mg at 01/26/15 1350  . desvenlafaxine (PRISTIQ) 24 hr tablet 100 mg  100 mg Oral Daily Leia Alf, MD   100 mg at 01/26/15 1600  . fentaNYL (DURAGESIC - dosed mcg/hr) 50 mcg  50 mcg Transdermal Q72H Doctor Chlconversion, MD   50 mcg at 01/26/15 1350  . hydrochlorothiazide (HYDRODIURIL) tablet 25 mg  25 mg Oral Daily Doctor Chlconversion, MD   25 mg at 01/26/15 1442  . HYDROmorphone (DILAUDID) injection 1 mg  1 mg Intravenous Q4H PRN Doctor Chlconversion, MD   1 mg at 01/25/15 0853  . HYDROmorphone (DILAUDID) tablet 2 mg  2 mg Oral Q3H PRN Doctor Chlconversion, MD   2 mg at 01/26/15 0526  . lactulose (CHRONULAC) 10 GM/15ML solution 20 g  20 g Oral Q12H Dallas Schimke, MD   20 g at 01/26/15 1442  . lisinopril (PRINIVIL,ZESTRIL) tablet 40 mg  40 mg Oral Daily Doctor Chlconversion, MD   40 mg at 01/26/15 1442  . LORazepam (ATIVAN) injection 1 mg  1 mg Intravenous Once Lloyd Huger, MD      . LORazepam (ATIVAN) injection 1 mg  1 mg Intravenous Q8H PRN Lloyd Huger, MD      . ondansetron [facility]) injection 4 mg  4 mg Intravenous Q4H PRN Doctor Chlconversion, MD      . pantoprazole (PROTONIX) EC tablet 40 mg  40 mg Oral Q0600 Doctor Chlconversion, MD   40 mg at 01/26/15 0526  . polyethylene glycol (MIRALAX / GLYCOLAX) packet 17 g  17 g Oral Daily PRN Doctor Chlconversion, MD   17 g at 01/25/15 0853  . senna-docusate (Senokot-S) tablet 1 tablet  1 tablet Oral Q12H Doctor Chlconversion, MD   1 tablet at 01/25/15 2154  . sodium chloride 0.9 % injection 3-6 mL  3-6 mL Intravenous PRN Doctor Chlconversion, MD   6 mL at 01/25/15 1622    OBJECTIVE: Filed Vitals:   01/26/15 1550  BP: 170/86  Pulse:   Temp:   Resp:      There is no weight on file to calculate BMI.    ECOG FS0:2 - Symptomatic, <50% confined to bed  Physical Exam  Constitutional: She is oriented to person, place, and time and well-developed, well-nourished, and in  no distress. No distress.  HENT:  Head: Normocephalic and atraumatic.  Right Ear: External ear normal.  Left Ear: External ear normal.  Nose: Nose normal.  Mouth/Throat: Oropharynx is clear and moist.  Eyes: Conjunctivae and EOM are normal.  Neck: Normal range of motion. No JVD present. No tracheal deviation present. No thyromegaly present.  Cardiovascular: Normal rate and regular rhythm.  Exam reveals no friction rub.   No murmur heard. Pulmonary/Chest: No stridor. No respiratory distress. She has no wheezes. She has no rales. She exhibits no tenderness.  Abdominal: She exhibits no distension and no mass. There is no tenderness. There is no rebound.  Musculoskeletal: She exhibits no edema or tenderness.  Neurological: She is alert and oriented to person, place, and time. She has normal reflexes. Gait normal. GCS score is 15.  Patient was somewhat delirious  and hallucinating  Skin: Skin is warm and dry. No rash noted. She is not diaphoretic.  Psychiatric: Mood, memory and affect normal.     LAB RESULTS:     Component Value Date/Time   NA 140 01/24/2015 0440   K 4.9 01/25/2015 1619   K 4.1 01/24/2015 0440   CL 111 01/24/2015 0440   CO2 24 01/24/2015 0440   GLUCOSE 73 01/24/2015 0440   BUN 12 01/24/2015 0440   CREATININE 1.17* 01/24/2015 0440   CREATININE 1.35 01/14/2015   CALCIUM 8.5* 01/24/2015 0440   PROT 5.6* 01/23/2015 1213   ALBUMIN 3.2* 01/23/2015 1213   AST 23 01/23/2015 1213   ALT 16 01/23/2015 1213   ALKPHOS 46 01/23/2015 1213   GFRNONAA 45* 01/24/2015 0440   GFRAA 52* 01/24/2015 0440    No results found for: SPEP, UPEP  Lab Results  Component Value Date   WBC 4.2 01/25/2015   NEUTROABS 2.7 01/25/2015   HGB 7.7* 01/25/2015   HCT 22.8* 01/25/2015   MCV 104.9* 01/25/2015   PLT 105* 01/25/2015    @LASTCHEMISTRY @  No results found for: LABCA2  No components found for: LABCA125  No results for input(s): INR in the last 168 hours.  No results found  for: COLORURINE, APPEARANCEUR, LABSPEC, PHURINE, GLUCOSEU, HGBUR, BILIRUBINUR, KETONESUR, PROTEINUR, UROBILINOGEN, NITRITE, LEUKOCYTESUR  STUDIES: Dg Chest 2 View  01/17/2015   CLINICAL DATA:  cough, fever  EXAM: CHEST  2 VIEW  COMPARISON:  PET-CT 04/2014.  FINDINGS: The cardiopericardial silhouette is within normal limits. Tortuous thoracic aorta. Unchanged RIGHT IJ Port-A-Cath with the tip at the cavoatrial junction. Buttons project over the chest. Monitoring leads project over the chest. No airspace disease. No pleural effusion. Hiatal hernia is present with fluid level in the intrathoracic stomach on the lateral view.  IMPRESSION: No active cardiopulmonary disease.   Electronically Signed   By: Dereck Ligas M.D.   On: 01/17/2015 15:58   Ct Abdomen Pelvis W Contrast  01/17/2015   CLINICAL DATA:  Ovarian cancer. On chemotherapy. Generalized weakness and abdominal pain. UTI. Nausea vomiting diarrhea.  EXAM: CT ABDOMEN AND PELVIS WITH CONTRAST  TECHNIQUE: Multidetector CT imaging of the abdomen and pelvis was performed using the standard protocol following bolus administration of intravenous contrast.  CONTRAST:  75 mL Omnipaque 300 IV  COMPARISON:  Head 03/11/2014, CT abdomen pelvis 07/30/2013  FINDINGS: Lung bases are clear.  Large hiatal hernia.  Prior cholecystectomy. Common bile duct is dilated measuring 13.7 mm. This has progressed from the prior study. Common bile duct however is not significantly dilated at 6 mm. Correlate with LFT. No liver lesion. Pancreas and spleen are negative.  Extra renal pelvis on the right. No hydronephrosis. No renal mass. Small left renal cysts. Urinary bladder normal.  Large and small bowel diffusely dilated with air-fluid levels. Fluid-filled colon and rectum. Findings suggest ileus or gastroenteritis.  Right adnexal cystic mass measures 38 x 30 mm. This is predominantly cystic but has a solid component and is likely due to tumor. This is unchanged from the prior CT in  2014. Small omental soft tissue density deep to the umbilicus measures 1 cm and appears improved from the prior PET-CT. This showed hypermetabolic uptake on the PET-CT.  No free fluid.  No adenopathy.  Mild atherosclerotic aorta without aneurysm.  Scoliosis and lumbar degenerative change.  No acute bony abnormality  IMPRESSION: Findings compatible with ileus/ gastroenteritis. Large and small bowel dilated with air-fluid levels. Diffuse fluid-filled colon.  Common hepatic duct  dilated but common bile duct nondilated. This may be related to cholecystectomy. Correlate with LFT.  Right adnexal cystic mass 38 x 30 mm compatible with stable tumor unchanged from prior studies. Omental nodule is slightly improved from the prior PET-CT.   Electronically Signed   By: Franchot Gallo M.D.   On: 01/17/2015 19:03   Dg Abdomen 3-way (incl Pa Cxr) ([facility] Hx)  01/24/2015   CLINICAL DATA:  Ileus with abdominal pain  EXAM: ABDOMEN SERIES  COMPARISON:  01/23/2015; 01/22/2015; CT abdomen pelvis - 01/17/2015  FINDINGS: Grossly unchanged cardiac silhouette and mediastinal contours. Stable position of support apparatus. The lungs remain hyperexpanded with flattening of the bilateral hemidiaphragms. Unchanged trace bilateral effusions with associated bibasilar opacities. No new focal airspace opacities. No evidence of edema. No pneumothorax.  Moderate to large colonic stool burden without evidence of enteric obstruction. No pneumoperitoneum, pneumatosis or portal venous gas. Small hiatal hernia is suspected.  Mild to moderate scoliotic curvature of the thoracolumbar spine.  IMPRESSION: 1. Similar findings of lung hyperexpansion and chronic trace bilateral effusions without definite superimposed acute cardiopulmonary disease. 2. Moderate to large colonic stool burden without evidence of enteric obstruction.   Electronically Signed   By: Sandi Mariscal M.D.   On: 01/24/2015 08:19   Dg Abdomen 3-way (incl Pa Cxr) ([facility] Hx)  01/23/2015    CLINICAL DATA:  Abdominal pain.  History of cancer.  EXAM: ABDOMEN SERIES  COMPARISON:  01/22/2015  FINDINGS: No cardiomegaly. Stable aortic and hilar contours. A small hiatal hernia is again noted.  Right IJ porta catheter, tip at the upper cavoatrial junction.  Chronic hyperinflation. There is no edema, consolidation, effusion, or pneumothorax.  Unchanged gas dilated small bowel. There is a paucity of colonic distention, but based on admission CT this is likely a residual ileus rather than partial obstruction. Previously administered oral contrast is still seen at the level the rectum; none was retained in the small bowel. No pneumoperitoneum or evidence of pneumatosis.  IMPRESSION: 1. Unchanged gaseous dilation of small bowel, likely ileus given admission CT 01/17/2015. 2. Small hiatal hernia.   Electronically Signed   By: Monte Fantasia M.D.   On: 01/23/2015 13:27   Dg Abdomen 3-way (incl Pa Cxr) ([facility] Hx)  01/23/2015   CLINICAL DATA:  Acute lower abdominal pain.  EXAM: ABDOMEN SERIES  COMPARISON:  CT scan of January 17, 2015.  FINDINGS: Mildly dilated small bowel loops are noted which may simply represent ileus. Residual contrast is seen in the rectum. No colonic dilatation is noted. Stool is seen in descending colon. Phlebolith is seen in the pelvis. No free air is noted. Moderate hiatal hernia is noted. Minimal right basilar subsegmental atelectasis is noted. Right-sided Port-A-Cath is noted with distal tip in expected position of cavoatrial junction.  IMPRESSION: Mildly dilated small bowel loops are noted which may simply represent ileus, but followup radiographs are recommended to rule out obstruction. Minimal right basilar subsegmental atelectasis is noted as well.   Electronically Signed   By: Marijo Conception, M.D.   On: 01/23/2015 08:19    ASSESSMENT:  Hallucinations and agitation wasWas due to patient's deep and prolonged and chronic psychiatric issues and medication.  Will get help from  psychiatry side without we are dealing with brain mass but will proceed with MRI scan of brain.  PLAN: MRI scan of brain  Detailed discussion   regarding depression issues  Total duration of visit was  45 min in more than 50%of timeWas spent in discussing  with Optometrist.  Patient expressed understanding and was in agreement with this plan. She also understands that She can call clinic at any time with any questions, concerns, or complaints.    Ovarian cancer   Staging form: Ovary, AJCC 7th Edition     Clinical stage from 05/16/2013: Stage IIIC (yT3c, N1, M0) - Signed by Evlyn Kanner, NP on 01/15/2015   Forest Gleason, MD   01/26/2015 8:33 PM

## 2015-01-26 NOTE — Progress Notes (Addendum)
Pt very confused/agitated/combative with sitter and nurse;refusing meds. Son stated he could not tolerate her behavior and is going to go home. Dilaudid IV given with pt more calm. TC with Dr. Grayland Ormond with order obtained for ativan IV given with effective. MRI rescheduled for tomorrow. Pt back to baseline. Prestiq started. Sitter remains and is essential for pt safety. Mirilax given with no results with no BM since 01/21/15.

## 2015-01-26 NOTE — Progress Notes (Signed)
Chief complaint is concern about oral intake. . She reports improvement since admission. She has been tolerating a full liquid diet.  She now think she making eat some solid food.  She reports she is emptying her bladder satisfactorily.  She has had a good bowel movement since admission.  She did not have any diarrhea or constipation since admission.  She reports moderate abdominal discomfort.  She is having no nausea or vomiting.  She reports no difficulties breathing. She reports resolution of her heartburn.  She reports she has not done any walking with a walker since admission.  Past medical history was reviewed noting her history of carcinomatosis of ovarian origin  Medicines noted including pantoprazole  Maximum temperature overnight was 99.6.  Current temperature 98.2 pulse 90 a respirations 20 blood pressure 175/95  GENERAL:  The patient is awake, alert but not oriented as to day of the week.  LUNGS:   Patient is in no respiratory distress.  Lungs are clear without rales rhonchi or wheezes. .  There is no respiratory distress.  HEART:   Regular rhythm,  normal S1-S2 without murmur.  ABDOMEN:   Nondistended soft and nontender, with no palpable mass, no hepatomegaly.  IMPRESSION known carcinomatosis of ovarian margin.  Improvement and oral intake.  Improvement and reflux symptoms  I recommended she advance to a solid diet.  Also recommended walking with a walker.  Also discuss with Dr. Oliva Bustard her slow progress and problems with depression.  There appear to be no indications for surgery at present.

## 2015-01-27 ENCOUNTER — Inpatient Hospital Stay: Payer: Medicare Other

## 2015-01-27 DIAGNOSIS — Z515 Encounter for palliative care: Secondary | ICD-10-CM

## 2015-01-27 DIAGNOSIS — F339 Major depressive disorder, recurrent, unspecified: Secondary | ICD-10-CM | POA: Insufficient documentation

## 2015-01-27 DIAGNOSIS — R410 Disorientation, unspecified: Secondary | ICD-10-CM | POA: Insufficient documentation

## 2015-01-27 DIAGNOSIS — F411 Generalized anxiety disorder: Secondary | ICD-10-CM | POA: Insufficient documentation

## 2015-01-27 MED ORDER — ZIPRASIDONE MESYLATE 20 MG IM SOLR
10.0000 mg | Freq: Once | INTRAMUSCULAR | Status: DC
  Filled 2015-01-27: qty 20

## 2015-01-27 MED ORDER — GADOBENATE DIMEGLUMINE 529 MG/ML IV SOLN
10.0000 mL | Freq: Once | INTRAVENOUS | Status: AC
  Administered 2015-01-27: 10:00:00 10 mL via INTRAVENOUS

## 2015-01-27 MED ORDER — AMLODIPINE BESYLATE 5 MG PO TABS
5.0000 mg | ORAL_TABLET | ORAL | Status: AC | PRN
  Administered 2015-01-27 – 2015-01-29 (×2): 5 mg via ORAL
  Filled 2015-01-27 (×2): qty 1

## 2015-01-27 MED ORDER — HYDROMORPHONE HCL 1 MG/ML IJ SOLN
1.0000 mg | INTRAMUSCULAR | Status: DC | PRN
  Administered 2015-01-28: 04:00:00 1 mg via INTRAVENOUS
  Filled 2015-01-27: qty 1

## 2015-01-27 MED ORDER — HALOPERIDOL LACTATE 5 MG/ML IJ SOLN
INTRAMUSCULAR | Status: AC
  Administered 2015-01-27: 2 mg via INTRAVENOUS
  Filled 2015-01-27: qty 1

## 2015-01-27 MED ORDER — LORAZEPAM 2 MG/ML IJ SOLN
INTRAMUSCULAR | Status: AC
  Filled 2015-01-27: qty 1

## 2015-01-27 MED ORDER — HALOPERIDOL LACTATE 5 MG/ML IJ SOLN
2.0000 mg | INTRAMUSCULAR | Status: DC | PRN
  Administered 2015-01-27 – 2015-01-30 (×3): 2 mg via INTRAVENOUS
  Filled 2015-01-27 (×2): qty 1

## 2015-01-27 MED ORDER — LORAZEPAM 2 MG/ML IJ SOLN
2.0000 mg | INTRAMUSCULAR | Status: AC
  Administered 2015-01-27: 14:00:00 2 mg via INTRAVENOUS

## 2015-01-27 MED ORDER — HALOPERIDOL LACTATE 5 MG/ML IJ SOLN: 2.0000 mg | INTRAMUSCULAR | Status: AC

## 2015-01-27 MED ORDER — LISINOPRIL 10 MG PO TABS
10.0000 mg | ORAL_TABLET | Freq: Every day | ORAL | Status: DC
  Administered 2015-01-27: 10 mg via ORAL
  Filled 2015-01-27: qty 1

## 2015-01-27 MED ORDER — HALOPERIDOL LACTATE 5 MG/ML IJ SOLN
2.0000 mg | INTRAMUSCULAR | Status: AC
  Administered 2015-01-27: 2 mg via INTRAVENOUS

## 2015-01-27 NOTE — Consult Note (Signed)
  Psychiatry: Follow-up patient with delirium probably multifactorial from medication and effects of her illness.  Today the patient is agitated and angry.  Complains that people are "telling her what to do".  She is not eating well and not drinking well.  Patient is resistant to appropriate care and behaving in a manner that is potentially dangerous to herself and that she is fighting against the nurses and trying to get out of bed without understanding her medical condition.  Patient denies being in physical pain.  Denies suicidal ideation.  Admits to being anxious depressed and angry.  On mental status patient is awake.  Intense eye contact.  Fidgety and agitated.  She is confused and disoriented thinking that she is in her own home.  Not recognizing her medical situation.  Thoughts are disorganized.  Affect is labile.  Case discussed with family and nursing.  Continues to be delirious.  Orders placed for when necessary haloperidol 2 mg every 2 hours in addition to stat doses.  Also staff additional Ativan given.  Landscape architect.  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  Diagnosis delirium multifactorial

## 2015-01-27 NOTE — Evaluation (Signed)
Lupus @ [facility] [phone]  Fax:([phone] [place]: Mar 21, 1936  MR#: [id number]  KGY#:[id number]  No care team member to display  CHIEF COMPLAINT: No chief complaint on file.     Peritoneal carcinomatosis   01/15/2015 Initial Diagnosis Peritoneal carcinomatosis    Oncology Flowsheet 01/25/2015 01/26/2015 01/26/2015 01/26/2015 01/27/2015 01/27/2015 01/27/2015  ALPRAZolam (XANAX) PO 1 mg 1 mg 1 mg 1 mg 1 mg      LORazepam (ATIVAN) IV - 1 mg     1 mg 1 mg 2 mg  OLANZapine (ZYPREXA) PO   - - - - - -    INTERVAL HISTORY: Patient continues to be confused and disoriented .Had been seen by psychiatrist day.  Going for MRI scan of brain.  Abdominal pain has improved.o nausea.  No vomiting.  REVIEW OF SYSTEMS:   Review of Systems  Constitutional: Positive for weight loss and malaise/fatigue.  Neurological: Positive for weakness.  Psychiatric/Behavioral: Positive for depression, hallucinations and memory loss.  All other systems reviewed and are negative.  Patient remains somewhat agitated .lying in the bed.not in any pain      PAST MEDICAL HISTORY: Past Medical History  Diagnosis Date  . Peritoneal carcinomatosis 01/15/2015  . Ovarian cancer   . Diabetes mellitus without complication   . Anemia   . GERD (gastroesophageal reflux disease)   . Irregular cardiac rhythm   . Chronic back pain   . Depression     PAST SURGICAL HISTORY: Past Surgical History  Procedure Laterality Date  . Abdominal hysterectomy    . Appendectomy    . Cholecystectomy    . Heel spur surgery    . Tubal ligation      FAMILY HISTORY History reviewed. No pertinent family history.  GYNECOLOGIC HISTORY:  No LMP recorded.     ADVANCED DIRECTIVES:    HEALTH MAINTENANCE: History  Substance Use Topics  . Smoking status: Former Research scientist (life sciences)  . Smokeless tobacco: Not on file  . Alcohol Use: Not on file     Colonoscopy:  PAP:  Bone density:  Lipid panel:  Allergies    Allergen Reactions  . Macrodantin [Nitrofurantoin] Hives and Itching  . Morphine And Related Other (See Comments)    Ineffective for pain   . Tequin [Gatifloxacin] Other (See Comments)    Unknown reaction    Current Facility-Administered Medications  Medication Dose Route Frequency Provider Last Rate Last Dose  . 0.9 % NaCl with KCl 20 mEq/ L  infusion   Intravenous Continuous Doctor Chlconversion, MD 60 mL/hr at 01/27/15 [phone]    . ALPRAZolam Duanne Moron) tablet 1 mg  1 mg Oral 3 times per day Doctor Chlconversion, MD   1 mg at 01/27/15 7858  . amLODipine (NORVASC) tablet 5 mg  5 mg Oral PRN Forest Gleason, MD      . desvenlafaxine (PRISTIQ) 24 hr tablet 100 mg  100 mg Oral Daily Leia Alf, MD   100 mg at 01/27/15 1027  . fentaNYL (DURAGESIC - dosed mcg/hr) 50 mcg  50 mcg Transdermal Q72H Doctor Chlconversion, MD   50 mcg at 01/26/15 1350  . haloperidol lactate (HALDOL) injection 2 mg  2 mg Intravenous Q2H PRN Gonzella Lex, MD   2 mg at 01/27/15 1536  . haloperidol lactate (HALDOL) injection 2 mg  2 mg Intravenous STAT Gonzella Lex, MD   2 mg at 01/27/15 1538  . hydrochlorothiazide (HYDRODIURIL) tablet 25 mg  25 mg Oral Daily Doctor Chlconversion,  MD   25 mg at 01/27/15 1028  . lactulose (CHRONULAC) 10 GM/15ML solution 20 g  20 g Oral Q12H Dallas Schimke, MD   20 g at 01/27/15 1026  . lisinopril (PRINIVIL,ZESTRIL) tablet 10 mg  10 mg Oral Daily Forest Gleason, MD   10 mg at 01/27/15 1028  . lisinopril (PRINIVIL,ZESTRIL) tablet 40 mg  40 mg Oral Daily Doctor Chlconversion, MD   40 mg at 01/27/15 1027  . LORazepam (ATIVAN) 2 MG/ML injection           . ondansetron (ZOFRAN) injection 4 mg  4 mg Intravenous Q4H PRN Doctor Chlconversion, MD      . pantoprazole (PROTONIX) EC tablet 40 mg  40 mg Oral Q0600 Doctor Chlconversion, MD   40 mg at 01/27/15 0263  . polyethylene glycol (MIRALAX / GLYCOLAX) packet 17 g  17 g Oral Daily PRN Doctor Chlconversion, MD   17 g at 01/25/15 0853  .  senna-docusate (Senokot-S) tablet 1 tablet  1 tablet Oral Q12H Doctor Chlconversion, MD   1 tablet at 01/27/15 1027  . sodium chloride 0.9 % injection 3-6 mL  3-6 mL Intravenous PRN Doctor Chlconversion, MD   6 mL at 01/25/15 1622  . ziprasidone (GEODON) injection 10 mg  10 mg Intramuscular Once Gonzella Lex, MD        OBJECTIVE: Filed Vitals:   01/27/15 1455  BP: 154/109  Pulse: 96  Temp: 98.8 F (37.1 C)  Resp:      There is no weight on file to calculate BMI.    ECOG FS:2 - Symptomatic, <50% confined to bed  Physical Exam  Cardiovascular: Normal rate.   Pulmonary/Chest: Breath sounds normal.  Musculoskeletal: She exhibits tenderness.  Neurological: Coordination abnormal.  Patient is somewhat agitated.  Confused and disoriented. Not in any acute distress.  Neurological exam is nonfocal     LAB RESULTS:     Component Value Date/Time   NA 140 01/24/2015 0440   K 4.9 01/25/2015 1619   K 4.1 01/24/2015 0440   CL 111 01/24/2015 0440   CO2 24 01/24/2015 0440   GLUCOSE 73 01/24/2015 0440   BUN 12 01/24/2015 0440   CREATININE 1.17* 01/24/2015 0440   CREATININE 1.35 01/14/2015   CALCIUM 8.5* 01/24/2015 0440   PROT 5.6* 01/23/2015 1213   ALBUMIN 3.2* 01/23/2015 1213   AST 23 01/23/2015 1213   ALT 16 01/23/2015 1213   ALKPHOS 46 01/23/2015 1213   GFRNONAA 45* 01/24/2015 0440   GFRAA 52* 01/24/2015 0440    No results found for: SPEP, UPEP  Lab Results  Component Value Date   WBC 4.2 01/25/2015   NEUTROABS 2.7 01/25/2015   HGB 7.7* 01/25/2015   HCT 22.8* 01/25/2015   MCV 104.9* 01/25/2015   PLT 105* 01/25/2015    @LASTCHEMISTRY @  No results found for: LABCA2  No components found for: LABCA125  No results for input(s): INR in the last 168 hours.  No results found for: COLORURINE, APPEARANCEUR, LABSPEC, PHURINE, GLUCOSEU, HGBUR, BILIRUBINUR, KETONESUR, PROTEINUR, UROBILINOGEN, NITRITE, LEUKOCYTESUR  STUDIES: Dg Chest 2 View  01/17/2015   CLINICAL DATA:   cough, fever  EXAM: CHEST  2 VIEW  COMPARISON:  PET-CT 04/2014.  FINDINGS: The cardiopericardial silhouette is within normal limits. Tortuous thoracic aorta. Unchanged RIGHT IJ Port-A-Cath with the tip at the cavoatrial junction. Buttons project over the chest. Monitoring leads project over the chest. No airspace disease. No pleural effusion. Hiatal hernia is present with fluid level in the intrathoracic  stomach on the lateral view.  IMPRESSION: No active cardiopulmonary disease.   Electronically Signed   By: Dereck Ligas M.D.   On: 01/17/2015 15:58   Mr Jeri Cos YW Contrast  01/27/2015   CLINICAL DATA:  Confusion and declining mental status. History of ovarian cancer.  EXAM: MRI HEAD WITHOUT AND WITH CONTRAST  TECHNIQUE: Multiplanar, multiecho pulse sequences of the brain and surrounding structures were obtained without and with intravenous contrast.  CONTRAST:  29mL MULTIHANCE GADOBENATE DIMEGLUMINE 529 MG/ML IV SOLN  COMPARISON:  03/22/2012  FINDINGS: Calvarium and upper cervical spine: No marrow signal abnormality.  Orbits: Left cataract resection.  Sinuses: Right mastoid opacification with fluid and probable mucosal thickening. Clear nasopharynx. Negative paranasal sinuses.  Brain: No acute abnormality such as infarct, hemorrhage, hydrocephalus, or mass lesion. No evidence of large vessel occlusion. T2 and FLAIR hyperintensity around the lateral ventricles consistent with mild for age small-vessel disease. No abnormal intracranial enhancement. Normal cerebral volume. No abnormal cortical diffusion.  IMPRESSION: 1. No acute intracranial finding. 2. Mild for age chronic small vessel disease. 3. Right mastoiditis.   Electronically Signed   By: Monte Fantasia M.D.   On: 01/27/2015 09:59   Ct Abdomen Pelvis W Contrast  01/17/2015   CLINICAL DATA:  Ovarian cancer. On chemotherapy. Generalized weakness and abdominal pain. UTI. Nausea vomiting diarrhea.  EXAM: CT ABDOMEN AND PELVIS WITH CONTRAST  TECHNIQUE:  Multidetector CT imaging of the abdomen and pelvis was performed using the standard protocol following bolus administration of intravenous contrast.  CONTRAST:  75 mL Omnipaque 300 IV  COMPARISON:  Head 03/11/2014, CT abdomen pelvis 07/30/2013  FINDINGS: Lung bases are clear.  Large hiatal hernia.  Prior cholecystectomy. Common bile duct is dilated measuring 13.7 mm. This has progressed from the prior study. Common bile duct however is not significantly dilated at 6 mm. Correlate with LFT. No liver lesion. Pancreas and spleen are negative.  Extra renal pelvis on the right. No hydronephrosis. No renal mass. Small left renal cysts. Urinary bladder normal.  Large and small bowel diffusely dilated with air-fluid levels. Fluid-filled colon and rectum. Findings suggest ileus or gastroenteritis.  Right adnexal cystic mass measures 38 x 30 mm. This is predominantly cystic but has a solid component and is likely due to tumor. This is unchanged from the prior CT in 2014. Small omental soft tissue density deep to the umbilicus measures 1 cm and appears improved from the prior PET-CT. This showed hypermetabolic uptake on the PET-CT.  No free fluid.  No adenopathy.  Mild atherosclerotic aorta without aneurysm.  Scoliosis and lumbar degenerative change.  No acute bony abnormality  IMPRESSION: Findings compatible with ileus/ gastroenteritis. Large and small bowel dilated with air-fluid levels. Diffuse fluid-filled colon.  Common hepatic duct dilated but common bile duct nondilated. This may be related to cholecystectomy. Correlate with LFT.  Right adnexal cystic mass 38 x 30 mm compatible with stable tumor unchanged from prior studies. Omental nodule is slightly improved from the prior PET-CT.   Electronically Signed   By: Franchot Gallo M.D.   On: 01/17/2015 19:03   Dg Abdomen 3-way (incl Pa Cxr) ([facility] Hx)  01/24/2015   CLINICAL DATA:  Ileus with abdominal pain  EXAM: ABDOMEN SERIES  COMPARISON:  01/23/2015; 01/22/2015; CT  abdomen pelvis - 01/17/2015  FINDINGS: Grossly unchanged cardiac silhouette and mediastinal contours. Stable position of support apparatus. The lungs remain hyperexpanded with flattening of the bilateral hemidiaphragms. Unchanged trace bilateral effusions with associated bibasilar opacities. No new focal  airspace opacities. No evidence of edema. No pneumothorax.  Moderate to large colonic stool burden without evidence of enteric obstruction. No pneumoperitoneum, pneumatosis or portal venous gas. Small hiatal hernia is suspected.  Mild to moderate scoliotic curvature of the thoracolumbar spine.  IMPRESSION: 1. Similar findings of lung hyperexpansion and chronic trace bilateral effusions without definite superimposed acute cardiopulmonary disease. 2. Moderate to large colonic stool burden without evidence of enteric obstruction.   Electronically Signed   By: Sandi Mariscal M.D.   On: 01/24/2015 08:19   Dg Abdomen 3-way (incl Pa Cxr) ([facility] Hx)  01/23/2015   CLINICAL DATA:  Abdominal pain.  History of cancer.  EXAM: ABDOMEN SERIES  COMPARISON:  01/22/2015  FINDINGS: No cardiomegaly. Stable aortic and hilar contours. A small hiatal hernia is again noted.  Right IJ porta catheter, tip at the upper cavoatrial junction.  Chronic hyperinflation. There is no edema, consolidation, effusion, or pneumothorax.  Unchanged gas dilated small bowel. There is a paucity of colonic distention, but based on admission CT this is likely a residual ileus rather than partial obstruction. Previously administered oral contrast is still seen at the level the rectum; none was retained in the small bowel. No pneumoperitoneum or evidence of pneumatosis.  IMPRESSION: 1. Unchanged gaseous dilation of small bowel, likely ileus given admission CT 01/17/2015. 2. Small hiatal hernia.   Electronically Signed   By: Monte Fantasia M.D.   On: 01/23/2015 13:27   Dg Abdomen 3-way (incl Pa Cxr) ([facility] Hx)  01/23/2015   CLINICAL DATA:  Acute lower abdominal  pain.  EXAM: ABDOMEN SERIES  COMPARISON:  CT scan of January 17, 2015.  FINDINGS: Mildly dilated small bowel loops are noted which may simply represent ileus. Residual contrast is seen in the rectum. No colonic dilatation is noted. Stool is seen in descending colon. Phlebolith is seen in the pelvis. No free air is noted. Moderate hiatal hernia is noted. Minimal right basilar subsegmental atelectasis is noted. Right-sided Port-A-Cath is noted with distal tip in expected position of cavoatrial junction.  IMPRESSION: Mildly dilated small bowel loops are noted which may simply represent ileus, but followup radiographs are recommended to rule out obstruction. Minimal right basilar subsegmental atelectasis is noted as well.   Electronically Signed   By: Marijo Conception, M.D.   On: 01/23/2015 08:19    ASSESSMENT: delirium hallucination MRI scan of brain is negative for any malignancy Will get physiotherapy evaluation Note from psychiatrist's has been reviewed. Will get palliative care to discuss goal of therapy as I do not believe patient can tolerate any further chemotherapy unless there is significant improvement in psychiatric condition. Living will and is to be discussed with the family. Pain is under better control and pain medication can be reduced   PLAN:  Physiotherapy. Palliative care consult Reduce pain medication Patient expressed understanding and was in agreement with this plan. She also understands that She can call clinic at any time with any questions, concerns, or complaints.    Ovarian cancer   Staging form: Ovary, AJCC 7th Edition     Clinical stage from 05/16/2013: Stage IIIC (yT3c, N1, M0) - Signed by Evlyn Kanner, NP on 01/15/2015   Forest Gleason, MD   01/27/2015 7:14 PM

## 2015-01-27 NOTE — Plan of Care (Signed)
Problem: Discharge Progression Outcomes Goal: Activity appropriate for discharge plan Outcome: Progressing Patient is calm, cooperative and confused. No c/o pain at this time, no N/V. Sitter at bedside. Patient had a BM this AM.

## 2015-01-27 NOTE — Progress Notes (Signed)
Patiently currently followed by [facility] home health SN and PT and was admitted to home health services on 4.26.16.  Patient readmitted to [facility] on 4.29.16.  Updated information faxed to referral intake.  Will continue to follow through final disposition.  Dimas Aguas RN Clinical Nurse Liaison [facility] [phone]

## 2015-01-27 NOTE — Progress Notes (Signed)
Dr. Grayland Ormond called for high blood pressure of 196/93 HR 90. MD stated to check again with morning VS and if it is still high, will treat blood pressure.  Emeterio Reeve, RN

## 2015-01-27 NOTE — Progress Notes (Signed)
MD paged at [phone] regarding blood pressure. Awaiting call back.

## 2015-01-27 NOTE — Plan of Care (Signed)
Problem: Discharge Progression Outcomes Goal: Other Discharge Outcomes/Goals Outcome: Not Progressing Plan of Care Progress to Goal:  Pt got very agitated around lunch time - Dr. Weber Cooks called.   Palliative Consult also done today.  Ativan and dilaudid d/ced.  PT consult put in b/c it doesn't seem currently feasible for pt  To go home w/son.  Palliative will f/u family abt code status and goals.  Poor PO intake.

## 2015-01-27 NOTE — Consult Note (Addendum)
Palliative Medicine Inpatient Consult Note   Name: Jessica Avery Date: 01/27/2015 MRN: [id number]  DOB: 1936/01/19  Referring Physician: Forest Gleason, MD  Palliative Care consult requested for this 79 y.o. female for goals of medical therapy in patient with stage IV ovarian cancer with peritoneal carcinomatosis, MDD, DM, admitted with abd pain. Hospital course complicated by delirium. At present, pt is lying in bed. Agitated. Daughter and son at bedside.    REVIEW OF SYSTEMS:  Patient is not able to provide ROS  SOCIAL HISTORY:  reports that she has quit smoking. She does not have any smokeless tobacco history on file. Pt is widowed. She lives at home with one of her sons. She has 2 daughters and 2 sons.   LEGAL DOCUMENTS: none   CODE STATUS: Full code  PAST MEDICAL HISTORY: Past Medical History  Diagnosis Date  . Peritoneal carcinomatosis 01/15/2015  . Ovarian cancer   . Diabetes mellitus without complication   . Anemia   . GERD (gastroesophageal reflux disease)   . Irregular cardiac rhythm   . Chronic back pain   . Depression     PAST SURGICAL HISTORY:  Past Surgical History  Procedure Laterality Date  . Abdominal hysterectomy    . Appendectomy    . Cholecystectomy    . Heel spur surgery    . Tubal ligation      ALLERGIES:  is allergic to macrodantin; morphine and related; and tequin.  MEDICATIONS:  Current Facility-Administered Medications  Medication Dose Route Frequency Provider Last Rate Last Dose  . 0.9 % NaCl with KCl 20 mEq/ L  infusion   Intravenous Continuous Doctor Chlconversion, MD 60 mL/hr at 01/27/15 [phone]    . ALPRAZolam Duanne Moron) tablet 1 mg  1 mg Oral 3 times per day Doctor Chlconversion, MD   1 mg at 01/27/15 0923  . amLODipine (NORVASC) tablet 5 mg  5 mg Oral PRN Forest Gleason, MD      . desvenlafaxine (PRISTIQ) 24 hr tablet 100 mg  100 mg Oral Daily Leia Alf, MD   100 mg at 01/27/15 1027  . fentaNYL (DURAGESIC - dosed mcg/hr) 50 mcg  50  mcg Transdermal Q72H Doctor Chlconversion, MD   50 mcg at 01/26/15 1350  . haloperidol lactate (HALDOL) 5 MG/ML injection           . haloperidol lactate (HALDOL) injection 2 mg  2 mg Intravenous Q2H PRN Gonzella Lex, MD   2 mg at 01/27/15 1406  . haloperidol lactate (HALDOL) injection 2 mg  2 mg Intravenous STAT Gonzella Lex, MD      . hydrochlorothiazide (HYDRODIURIL) tablet 25 mg  25 mg Oral Daily Doctor Chlconversion, MD   25 mg at 01/27/15 1028  . lactulose (CHRONULAC) 10 GM/15ML solution 20 g  20 g Oral Q12H Dallas Schimke, MD   20 g at 01/27/15 1026  . lisinopril (PRINIVIL,ZESTRIL) tablet 10 mg  10 mg Oral Daily Forest Gleason, MD   10 mg at 01/27/15 1028  . lisinopril (PRINIVIL,ZESTRIL) tablet 40 mg  40 mg Oral Daily Doctor Chlconversion, MD   40 mg at 01/27/15 1027  . LORazepam (ATIVAN) 2 MG/ML injection           . ondansetron (ZOFRAN) injection 4 mg  4 mg Intravenous Q4H PRN Doctor Chlconversion, MD      . pantoprazole (PROTONIX) EC tablet 40 mg  40 mg Oral Q0600 Doctor Chlconversion, MD   40 mg at 01/27/15 3007  . polyethylene  glycol (MIRALAX / GLYCOLAX) packet 17 g  17 g Oral Daily PRN Doctor Chlconversion, MD   17 g at 01/25/15 0853  . senna-docusate (Senokot-S) tablet 1 tablet  1 tablet Oral Q12H Doctor Chlconversion, MD   1 tablet at 01/27/15 1027  . sodium chloride 0.9 % injection 3-6 mL  3-6 mL Intravenous PRN Doctor Chlconversion, MD   6 mL at 01/25/15 1622  . ziprasidone (GEODON) injection 10 mg  10 mg Intramuscular Once Gonzella Lex, MD        Vital Signs: BP 154/109 mmHg  Pulse 96  Temp(Src) 98.8 F (37.1 C) (Oral)  Resp 20  SpO2 100% There were no vitals filed for this visit.  Estimated body mass index is 21.39 kg/(m^2) as calculated from the following:   Height as of 01/14/15: 5\' 5"  (1.651 m).   Weight as of 01/16/15: 58.299 kg (128 lb 8.4 oz).   PHYSICAL EXAM: General appearance: delirious  Unable to perform exam due to pt's agitation  LABS: CBC:     Component Value Date/Time   WBC 4.2 01/25/2015 1619   WBC 3.8 01/24/2015 0440   HGB 7.7* 01/25/2015 1619   HGB 7.7* 01/24/2015 0440   HCT 22.8* 01/25/2015 1619   HCT 22.3* 01/24/2015 0440   PLT 105* 01/25/2015 1619   PLT 118* 01/24/2015 0440   MCV 104.9* 01/25/2015 1619   MCV 104* 01/24/2015 0440   NEUTROABS 2.7 01/25/2015 1619   NEUTROABS 1.9 01/24/2015 0440   LYMPHSABS 0.8* 01/25/2015 1619   LYMPHSABS 1.3 01/24/2015 0440   MONOABS 0.6 01/25/2015 1619   MONOABS 0.5 01/24/2015 0440   EOSABS 0.1 01/25/2015 1619   EOSABS 0.1 01/24/2015 0440   BASOSABS 0.0 01/25/2015 1619   BASOSABS 0.0 01/24/2015 0440   Comprehensive Metabolic Panel:    Component Value Date/Time   NA 140 01/24/2015 0440   K 4.9 01/25/2015 1619   K 4.1 01/24/2015 0440   CL 111 01/24/2015 0440   CO2 24 01/24/2015 0440   BUN 12 01/24/2015 0440   CREATININE 1.17* 01/24/2015 0440   CREATININE 1.35 01/14/2015   GLUCOSE 73 01/24/2015 0440   CALCIUM 8.5* 01/24/2015 0440   AST 23 01/23/2015 1213   ALT 16 01/23/2015 1213   ALKPHOS 46 01/23/2015 1213   PROT 5.6* 01/23/2015 1213   ALBUMIN 3.2* 01/23/2015 1213    IMPRESSION:  Jessica Avery is a 79 y.o. female with stage IV ovarian cancer with peritoneal carcinomatosis, MDD, DM, admitted with abd pain. Hospital course complicated by delirium.   I spoke with Dr Weber Cooks. Will continue haldol prn but d/c lorazepam. Will also d/c hydromorphone. Pt has fentanyl TD for pain. Also is getting scheduled Xanax. Dr Weber Cooks to order Geodon.   I spoke with pt's daughter, Beverlee Nims. She says that daughter [name] is pt's POA but is unsure if pt has a HCPOA. We also talked about code status. Beverlee Nims will initiate family discussion re code status. I will follow up.   PLAN: 1. D/c lorazepam 2. D/c hydromorphone 3. Continue prn haldol 4. Dr Weber Cooks to order Geodon 5. Follow up with family re code status      More than 50% of the visit was spent in counseling/coordination of care:  Yes   Time Spent: 70 minutes

## 2015-01-28 ENCOUNTER — Ambulatory Visit: Payer: Self-pay

## 2015-01-28 ENCOUNTER — Other Ambulatory Visit: Payer: Self-pay

## 2015-01-28 ENCOUNTER — Ambulatory Visit: Payer: Self-pay | Admitting: Oncology

## 2015-01-28 DIAGNOSIS — G8929 Other chronic pain: Secondary | ICD-10-CM

## 2015-01-28 DIAGNOSIS — I10 Essential (primary) hypertension: Secondary | ICD-10-CM

## 2015-01-28 DIAGNOSIS — C786 Secondary malignant neoplasm of retroperitoneum and peritoneum: Secondary | ICD-10-CM

## 2015-01-28 DIAGNOSIS — F329 Major depressive disorder, single episode, unspecified: Secondary | ICD-10-CM

## 2015-01-28 DIAGNOSIS — G893 Neoplasm related pain (acute) (chronic): Principal | ICD-10-CM

## 2015-01-28 DIAGNOSIS — R1084 Generalized abdominal pain: Secondary | ICD-10-CM | POA: Insufficient documentation

## 2015-01-28 DIAGNOSIS — R443 Hallucinations, unspecified: Secondary | ICD-10-CM

## 2015-01-28 DIAGNOSIS — E119 Type 2 diabetes mellitus without complications: Secondary | ICD-10-CM

## 2015-01-28 DIAGNOSIS — K219 Gastro-esophageal reflux disease without esophagitis: Secondary | ICD-10-CM

## 2015-01-28 DIAGNOSIS — C569 Malignant neoplasm of unspecified ovary: Secondary | ICD-10-CM

## 2015-01-28 DIAGNOSIS — M549 Dorsalgia, unspecified: Secondary | ICD-10-CM

## 2015-01-28 DIAGNOSIS — R451 Restlessness and agitation: Secondary | ICD-10-CM

## 2015-01-28 DIAGNOSIS — R410 Disorientation, unspecified: Secondary | ICD-10-CM

## 2015-01-28 DIAGNOSIS — K5901 Slow transit constipation: Secondary | ICD-10-CM | POA: Insufficient documentation

## 2015-01-28 MED ORDER — SODIUM CHLORIDE 0.9 % IJ SOLN
10.0000 mL | Freq: Two times a day (BID) | INTRAMUSCULAR | Status: DC
  Administered 2015-02-02: 11:00:00 10 mL

## 2015-01-28 MED ORDER — SODIUM CHLORIDE 0.9 % IJ SOLN: 10.0000 mL | INTRAMUSCULAR | Status: DC | PRN

## 2015-01-28 MED ORDER — SODIUM CHLORIDE 0.9 % IJ SOLN: 10.0000 mL | Freq: Two times a day (BID) | INTRAMUSCULAR | Status: DC

## 2015-01-28 MED ORDER — HYDROCODONE-ACETAMINOPHEN 10-325 MG PO TABS
1.0000 | ORAL_TABLET | ORAL | Status: DC | PRN
  Administered 2015-01-28 (×3): 1 via ORAL
  Administered 2015-01-29 (×2): 2 via ORAL
  Administered 2015-02-01: 23:00:00 1 via ORAL
  Filled 2015-01-28 (×4): qty 1
  Filled 2015-01-28 (×2): qty 2

## 2015-01-28 MED ORDER — AMLODIPINE BESYLATE 10 MG PO TABS
10.0000 mg | ORAL_TABLET | Freq: Every day | ORAL | Status: DC
  Administered 2015-01-29 – 2015-02-02 (×5): 10 mg via ORAL
  Filled 2015-01-28 (×5): qty 1

## 2015-01-28 NOTE — Care Management (Signed)
Admitted to [facility] with the diagnosis of ovarian cancer. Discharged from this facility 01/19/15. Lives with son, Dominica Severin, [phone]  Followed by [place]. Last chemotherapy 01/14/15.  Sitter is at the bedside. Physical therapy evaluation pending. Shelbie Ammons RN MSN Care Management [phone]

## 2015-01-28 NOTE — Consult Note (Signed)
Palliative Medicine Inpatient Consult Follow Up Note   Name: Jessica Avery Date: 01/28/2015 MRN: [id number]  DOB: December 23, 1935  Referring Physician: Forest Gleason, MD  Palliative Care consult requested for this 79 y.o. female for goals of medical therapy in patient with stage IV ovarian cancer. Today, pt's mental status is much improved. She is oriented to person, DOB, son. She has a little difficulty with place. She is not agitated. Complains of mild abd pain. Son at bedside. Marland Kitchen    REVIEW OF SYSTEMS:  Pain: Mild and Regimen Modified Dyspnea:  Yes and No Nausea/Vomiting:  No Diarrhea:  No Constipation:   No Depression:   Yes Anxiety:   No Fatigue:   Yes  CODE STATUS: Full code   PAST MEDICAL HISTORY: Past Medical History  Diagnosis Date  . Peritoneal carcinomatosis 01/15/2015  . Ovarian cancer   . Diabetes mellitus without complication   . Anemia   . GERD (gastroesophageal reflux disease)   . Irregular cardiac rhythm   . Chronic back pain   . Depression     PAST SURGICAL HISTORY:  Past Surgical History  Procedure Laterality Date  . Abdominal hysterectomy    . Appendectomy    . Cholecystectomy    . Heel spur surgery    . Tubal ligation      Vital Signs: BP 157/78 mmHg  Pulse 85  Temp(Src) 98.7 F (37.1 C) (Oral)  Resp 18  SpO2 95% There were no vitals filed for this visit.  Estimated body mass index is 21.39 kg/(m^2) as calculated from the following:   Height as of 01/14/15: 5\' 5"  (1.651 m).   Weight as of 01/16/15: 58.299 kg (128 lb 8.4 oz).  PHYSICAL EXAM: General appearance: alert and ill-appearing Head: OP clear Neck: supple, symmetrical, trachea midline Resp: clear ant fields Cardio: regular rate and rhythm, S1, S2 normal, no murmur, click, rub or gallop GI: + BS's, tender to palpation Extremities: no edema, redness or tenderness in the calves or thighs Neurologic: Grossly normal  LABS: CBC:    Component Value Date/Time   WBC 4.2 01/25/2015 1619   WBC 3.8 01/24/2015 0440   HGB 7.7* 01/25/2015 1619   HGB 7.7* 01/24/2015 0440   HCT 22.8* 01/25/2015 1619   HCT 22.3* 01/24/2015 0440   PLT 105* 01/25/2015 1619   PLT 118* 01/24/2015 0440   MCV 104.9* 01/25/2015 1619   MCV 104* 01/24/2015 0440   NEUTROABS 2.7 01/25/2015 1619   NEUTROABS 1.9 01/24/2015 0440   LYMPHSABS 0.8* 01/25/2015 1619   LYMPHSABS 1.3 01/24/2015 0440   MONOABS 0.6 01/25/2015 1619   MONOABS 0.5 01/24/2015 0440   EOSABS 0.1 01/25/2015 1619   EOSABS 0.1 01/24/2015 0440   BASOSABS 0.0 01/25/2015 1619   BASOSABS 0.0 01/24/2015 0440   Comprehensive Metabolic Panel:    Component Value Date/Time   NA 140 01/24/2015 0440   K 4.9 01/25/2015 1619   K 4.1 01/24/2015 0440   CL 111 01/24/2015 0440   CO2 24 01/24/2015 0440   BUN 12 01/24/2015 0440   CREATININE 1.17* 01/24/2015 0440   CREATININE 1.35 01/14/2015   GLUCOSE 73 01/24/2015 0440   CALCIUM 8.5* 01/24/2015 0440   AST 23 01/23/2015 1213   ALT 16 01/23/2015 1213   ALKPHOS 46 01/23/2015 1213   PROT 5.6* 01/23/2015 1213   ALBUMIN 3.2* 01/23/2015 1213    IMPRESSION: Ms Mayhan is a 79 y.o. female with stage IV ovarian cancer with peritoneal carcinomatosis, MDD, DM, admitted with abd pain. Hospital  course complicated by delirium.   [name] mental status improved with geodon and prn haldol. She complains of intermittent abd pain. Takes hydrocodone for this at home. Will restart. Try to avoid hydromorphone which may worsen delirium.     PLAN: 1. Hydrocodone 10/325mg  1-2 q 4 hrs prn pain 2. Continue fentanyl TD 50 mcg 3. D/c hydrocodone 4. PT  REFERRALS TO BE ORDERED:  Physical Therapy   More than 50% of the visit was spent in counseling/coordination of care: YES  Time spent: 35 minutes

## 2015-01-28 NOTE — Consult Note (Signed)
  Psychiatry: Follow-up for this patient with depression and recent delirium. On interview today the patient states she is feeling much better. She is less confused although admits she has still had some episodes of visual hallucinations and confusion today. Feeling less panicky. More hopeful.  On review of systems her strength is a little bit better but she still gets anxious about being able to stand on her own. Still feels weak. Denies suicidal ideation. Less depressed. Still occasional hallucinations.  On mental status she is alert and oriented appropriately today. Not currently having hallucinations. Denies suicidal or homicidal ideation. Makes good eye contact and interacts in a much more appropriate manner. Insight better.  It looks like she is needing only minimal when necessary Haldol. Hopefully she will be able to be discharged without needing antipsychotic at all. No indication to change medicines for now. Follow-up tomorrow.  Diagnosis delirium acute multifactorial, major depression recurrent severe

## 2015-01-28 NOTE — Evaluation (Signed)
Physical Therapy Evaluation Patient Details Name: Jessica Avery MRN: [id number] DOB: May 22, 1936 Today's Date: 01/28/2015   History of Present Illness  presented to ER (4/29) with recurrent, intractible abdominal pain; admitted with ileus.  Hospital course complicated by delirium. Of note, patient with recent previous hospitalization (4/23-4/25) due to gastritis with discharge home with son.  Clinical Impression  Upon evaluation, patient alert and oriented to self only.  Generally confused with significant cognitive deficit. Bilat UE/LE strength and ROM grossly WFL, but patient with very poor motor planning and task initiation.   Good spontaneous movement of all extremities, but requires hand-over-hand assist for purposeful, specific movement of all extremities (on command) and unable to initiate any automatic movement patterns (i.e., gait).  Currently requiring mod/max assist for sit/stand and max assist +2 (second person for chair follow) for all gait attempts.  Poor stepping performance, poor balance; very high fall risk.  Patient unsafe to return home at this time due to significant level of physical assist required for all mobility. Would benefit from skilled PT to address above deficits and promote optimal return to PLOF; recommend transition to STR upon discharge from acute hospitalization.     Follow Up Recommendations SNF (unsafe to return home at this time)    Equipment Recommendations  Rolling walker with 5" wheels;3in1 (PT)    Recommendations for Other Services       Precautions / Restrictions Precautions Precautions: Fall Precaution Comments: R chest port Restrictions Weight Bearing Restrictions: No      Mobility  Bed Mobility               General bed mobility comments: patient in recliner beginning/end of session  Transfers Overall transfer level: Needs assistance Equipment used: Rolling walker (2 wheeled) Transfers: Sit to/from Stand Sit to Stand: Mod  assist;Max assist         General transfer comment: hand-over-hand assist for UE/LE placement, anterior weight translation, lift off and dynamic balance  Ambulation/Gait Ambulation/Gait assistance: Mod assist;Max assist Ambulation Distance (Feet): 5 Feet Assistive device: Rolling walker (2 wheeled)       General Gait Details: very short, shuffling steps with minimal step height/length; posterior weight shift/LOB with absent balance/righting reactions.  Unable to initiate automatic stepping pattern despite manual faciltiation from therapist.  Easily confused with verbal cues; often stepping on contralateral foot, crossing midline, etc.  Very unsafe and extremely high fall risk.  Stairs            Wheelchair Mobility    Modified Rankin (Stroke Patients Only)       Balance Overall balance assessment: Needs assistance Sitting-balance support: No upper extremity supported Sitting balance-Leahy Scale: Fair     Standing balance support: Bilateral upper extremity supported;During functional activity Standing balance-Leahy Scale: Zero Standing balance comment: very heavy posterior lean with absent righting reactions                             Pertinent Vitals/Pain Pain Assessment: No/denies pain    Home Living Family/patient expects to be discharged to:: Private residence Living Arrangements: Spouse/significant other Available Help at Discharge: Family Type of Home: House Home Access: Stairs to enter   Technical brewer of Steps: 4 Home Layout: Two level   Additional Comments: all social history and home information provided by patient who is poor historian; information unreliable-will verify with son/family as available    Prior Function  Comments: patient reports having "just gotten a walker", but unable to articulate use or specific PLOF.  Will verify with family as appropriate.     Hand Dominance        Extremity/Trunk  Assessment   Upper Extremity Assessment: Overall WFL for tasks assessed (tremulousness in bilatUEs)           Lower Extremity Assessment: Overall WFL for tasks assessed (strength grossly WFL for basic transfers mobility, at least 4/5)         Communication   Communication: No difficulties  Cognition Arousal/Alertness: Awake/alert Behavior During Therapy: Anxious (tremulous, nervous; very fearful of falling) Overall Cognitive Status: Impaired/Different from baseline Area of Impairment: Orientation;Attention;Memory;Following commands;Safety/judgement;Awareness;Problem solving Orientation Level: Disoriented to;Place;Situation;Time Current Attention Level: Sustained Memory: Decreased short-term memory Following Commands: Follows one step commands inconsistently (50% time) Safety/Judgement: Decreased awareness of safety;Decreased awareness of deficits   Problem Solving: Slow processing;Decreased initiation;Difficulty sequencing;Requires tactile cues General Comments: easily confused with verbal cuing; very poor motor planning and task sequencing    General Comments      Exercises Other Exercises Other Exercises: Additional gait activities: 5' with RW, mod/max assist for dynamic balance and task sequencing--placed gloves on floor for visual target when stepping/advancing LEs.  Improved step performance with target, but unable to generalize once visual cues removed.  Very poor balance persists; unsafe to complete without RW and +1 mod/max assist.      Assessment/Plan    PT Assessment Patient needs continued PT services  PT Diagnosis Difficulty walking;Generalized weakness   PT Problem List Decreased strength;Decreased activity tolerance;Decreased balance;Decreased mobility;Decreased coordination;Decreased cognition;Decreased knowledge of use of DME;Decreased safety awareness;Decreased knowledge of precautions  PT Treatment Interventions DME instruction;Gait training;Stair  training;Functional mobility training;Therapeutic activities;Therapeutic exercise;Balance training;Neuromuscular re-education;Cognitive remediation;Patient/family education   PT Goals (Current goals can be found in the Care Plan section) Acute Rehab PT Goals Patient Stated Goal: unable to verbalize secondary to significant cognitive deficits PT Goal Formulation: Patient unable to participate in goal setting Time For Goal Achievement: 02/11/15 Potential to Achieve Goals: Fair Additional Goals Additional Goal #1: Patient to copmlete bed mobility indep for self-repositioning and pulmonary hygiene Additional Goal #2: Patient to complete all basic transfers with LRAD, min assist, for access to all seating surfaces in home environment.     Frequency Min 2X/week   Barriers to discharge Inaccessible home environment;Decreased caregiver support      Co-evaluation               End of Session Equipment Utilized During Treatment: Gait belt Activity Tolerance: Patient tolerated treatment well Patient left: in chair;with call bell/phone within reach;with nursing/sitter in room;with family/visitor present Nurse Communication: Mobility status (dc recs)         Time: 5993-5701 PT Time Calculation (min) (ACUTE ONLY): 20 min   Charges:   PT Evaluation $Initial PT Evaluation Tier I: 1 Procedure PT Treatments $Gait Training: 8-22 mins   PT G Codes:        Ellison Hughs February 25, 2015, 3:58 PM

## 2015-01-28 NOTE — Plan of Care (Addendum)
Problem: Discharge Progression Outcomes Goal: Discharge plan in place and appropriate Outcome: Progressing Address patient as Jessica Avery. Patient received chemotherapy 01/14/15. Patient is a high falls risk, offer toileting every hour. Medical history includes GERD, depression, controlled with home medication and ovarian cancer. Sitter remains at bedside Goal: Other Discharge Outcomes/Goals Outcome: Progressing Patient is calm, remains confused. Son and dog at bedside tonight, sitter still present. Norvasc given for BP with improvements. Patient remains incontinent, utilizes [facility] with assistance. Patient appears to be in no distress at this time.

## 2015-01-28 NOTE — Progress Notes (Signed)
Patient c/o pain at times, no PRN pain orders. Dr. Ma Hillock ordered 1 mg Dilaudid q 4 hours PRN IV push.

## 2015-01-28 NOTE — Progress Notes (Signed)
[place] @ [facility] Telephone:([phone]  Fax:(336) [place]: 1936/06/06  MR#: [id number]  EQA#:[id number]  No care team member to display  CHIEF COMPLAINT: No chief complaint on file.  Patient was admitted in the hospital with delirium and confusion abdominal pain which was uncontrolled   Peritoneal carcinomatosis   01/15/2015 Initial Diagnosis Peritoneal carcinomatosis    Oncology Flowsheet 01/26/2015 01/26/2015 01/26/2015 01/27/2015 01/27/2015 01/27/2015 01/28/2015  ALPRAZolam (XANAX) PO 1 mg 1 mg 1 mg 1 mg 1 mg   1 mg  LORazepam (ATIVAN) IV 1 mg     1 mg 1 mg 2 mg -  OLANZapine (ZYPREXA) PO - - - - - - -    INTERVAL HISTORY:  Patient was admitted in the hospital with gradually declining condition abdominal pain.  This morning extremely apprehensive and agitated.  Was worried about son getting graduated however patient did not have any grandson getting graduated now.  Finally patient's daughter came been and situation improved.  Patient was given Halder 1 mg IV with some improvement.  Patient was evaluated by psychiatry is MRI scan has been ordered of brain.  Abdominal pain was improved.  Diet was changed to full diet after discussing with Dr. Rochel Brome, surgical consult and REVIEW OF SYSTEMS:   ROS patient was very  Agitated ,hallucinating. Abdominal pain has improved.  No nausea.  No vomiting  Had a bowel movement. As per HPI. Otherwise, a complete review of systems is negatve.  PAST MEDICAL HISTORY: Past Medical History  Diagnosis Date  . Peritoneal carcinomatosis 01/15/2015  . Ovarian cancer   . Diabetes mellitus without complication   . Anemia   . GERD (gastroesophageal reflux disease)   . Irregular cardiac rhythm   . Chronic back pain   . Depression     PAST SURGICAL HISTORY: Past Surgical History  Procedure Laterality Date  . Abdominal hysterectomy    . Appendectomy    . Cholecystectomy    . Heel spur surgery    . Tubal ligation      FAMILY  HISTORY History reviewed. No pertinent family history.  GYNECOLOGIC HISTORY:  No LMP recorded.     ADVANCED DIRECTIVES:    HEALTH MAINTENANCE: History  Substance Use Topics  . Smoking status: Former Research scientist (life sciences)  . Smokeless tobacco: Not on file  . Alcohol Use: Not on file     Colonoscopy:  PAP:  Bone density:  Lipid panel:  Allergies  Allergen Reactions  . Macrodantin [Nitrofurantoin] Hives and Itching  . Morphine And Related Other (See Comments)    Ineffective for pain   . Tequin [Gatifloxacin] Other (See Comments)    Unknown reaction    Current Facility-Administered Medications  Medication Dose Route Frequency Provider Last Rate Last Dose  . 0.9 % NaCl with KCl 20 mEq/ L  infusion   Intravenous Continuous Doctor Chlconversion, MD 60 mL/hr at 01/28/15 0158    . ALPRAZolam Duanne Moron) tablet 1 mg  1 mg Oral 3 times per day Doctor Chlconversion, MD   1 mg at 01/28/15 0525  . amLODipine (NORVASC) tablet 5 mg  5 mg Oral PRN Forest Gleason, MD   5 mg at 01/27/15 2044  . desvenlafaxine (PRISTIQ) 24 hr tablet 100 mg  100 mg Oral Daily Leia Alf, MD   100 mg at 01/27/15 1027  . fentaNYL (DURAGESIC - dosed mcg/hr) 50 mcg  50 mcg Transdermal Q72H Doctor Chlconversion, MD   50 mcg at 01/26/15 1350  .  haloperidol lactate (HALDOL) injection 2 mg  2 mg Intravenous Q2H PRN Gonzella Lex, MD   2 mg at 01/27/15 1536  . haloperidol lactate (HALDOL) injection 2 mg  2 mg Intravenous STAT Gonzella Lex, MD   2 mg at 01/27/15 1538  . hydrochlorothiazide (HYDRODIURIL) tablet 25 mg  25 mg Oral Daily Doctor Chlconversion, MD   25 mg at 01/27/15 1028  . HYDROmorphone (DILAUDID) injection 1 mg  1 mg Intravenous Q4H PRN Leia Alf, MD   1 mg at 01/28/15 0415  . lactulose (CHRONULAC) 10 GM/15ML solution 20 g  20 g Oral Q12H Dallas Schimke, MD   20 g at 01/27/15 1026  . lisinopril (PRINIVIL,ZESTRIL) tablet 10 mg  10 mg Oral Daily Forest Gleason, MD   10 mg at 01/27/15 1028  . lisinopril  (PRINIVIL,ZESTRIL) tablet 40 mg  40 mg Oral Daily Doctor Chlconversion, MD   40 mg at 01/27/15 1027  . ondansetron (ZOFRAN) injection 4 mg  4 mg Intravenous Q4H PRN Doctor Chlconversion, MD      . pantoprazole (PROTONIX) EC tablet 40 mg  40 mg Oral Q0600 Doctor Chlconversion, MD   40 mg at 01/28/15 0525  . polyethylene glycol (MIRALAX / GLYCOLAX) packet 17 g  17 g Oral Daily PRN Doctor Chlconversion, MD   17 g at 01/25/15 0853  . senna-docusate (Senokot-S) tablet 1 tablet  1 tablet Oral Q12H Doctor Chlconversion, MD   1 tablet at 01/27/15 1027  . sodium chloride 0.9 % injection 3-6 mL  3-6 mL Intravenous PRN Doctor Chlconversion, MD   6 mL at 01/25/15 1622  . ziprasidone (GEODON) injection 10 mg  10 mg Intramuscular Once Gonzella Lex, MD        OBJECTIVE: Filed Vitals:   01/28/15 0718  BP: 157/78  Pulse: 85  Temp: 98.7 F (37.1 C)  Resp: 18     There is no weight on file to calculate BMI.    ECOG FS0:2 - Symptomatic, <50% confined to bed  Physical Exam  Constitutional: She is oriented to person, place, and time and well-developed, well-nourished, and in no distress. No distress.  HENT:  Head: Normocephalic and atraumatic.  Right Ear: External ear normal.  Left Ear: External ear normal.  Nose: Nose normal.  Mouth/Throat: Oropharynx is clear and moist.  Eyes: Conjunctivae and EOM are normal.  Neck: Normal range of motion. No JVD present. No tracheal deviation present. No thyromegaly present.  Cardiovascular: Normal rate and regular rhythm.  Exam reveals no friction rub.   No murmur heard. Pulmonary/Chest: No stridor. No respiratory distress. She has no wheezes. She has no rales. She exhibits no tenderness.  Abdominal: She exhibits no distension and no mass. There is no tenderness. There is no rebound.  Musculoskeletal: She exhibits no edema or tenderness.  Neurological: She is alert and oriented to person, place, and time. She has normal reflexes. Gait normal. GCS score is 15.    Patient was somewhat delirious   and hallucinating  Skin: Skin is warm and dry. No rash noted. She is not diaphoretic.  Psychiatric: Mood, memory and affect normal.     LAB RESULTS:     Component Value Date/Time   NA 140 01/24/2015 0440   K 4.9 01/25/2015 1619   K 4.1 01/24/2015 0440   CL 111 01/24/2015 0440   CO2 24 01/24/2015 0440   GLUCOSE 73 01/24/2015 0440   BUN 12 01/24/2015 0440   CREATININE 1.17* 01/24/2015 0440   CREATININE  1.35 01/14/2015   CALCIUM 8.5* 01/24/2015 0440   PROT 5.6* 01/23/2015 1213   ALBUMIN 3.2* 01/23/2015 1213   AST 23 01/23/2015 1213   ALT 16 01/23/2015 1213   ALKPHOS 46 01/23/2015 1213   GFRNONAA 45* 01/24/2015 0440   GFRAA 52* 01/24/2015 0440    No results found for: SPEP, UPEP  Lab Results  Component Value Date   WBC 4.2 01/25/2015   NEUTROABS 2.7 01/25/2015   HGB 7.7* 01/25/2015   HCT 22.8* 01/25/2015   MCV 104.9* 01/25/2015   PLT 105* 01/25/2015    @LASTCHEMISTRY @  No results found for: LABCA2  No components found for: LABCA125  No results for input(s): INR in the last 168 hours.  No results found for: COLORURINE, APPEARANCEUR, LABSPEC, PHURINE, GLUCOSEU, HGBUR, BILIRUBINUR, KETONESUR, PROTEINUR, UROBILINOGEN, NITRITE, LEUKOCYTESUR  STUDIES: Dg Chest 2 View  01/17/2015   CLINICAL DATA:  cough, fever  EXAM: CHEST  2 VIEW  COMPARISON:  PET-CT 04/2014.  FINDINGS: The cardiopericardial silhouette is within normal limits. Tortuous thoracic aorta. Unchanged RIGHT IJ Port-A-Cath with the tip at the cavoatrial junction. Buttons project over the chest. Monitoring leads project over the chest. No airspace disease. No pleural effusion. Hiatal hernia is present with fluid level in the intrathoracic stomach on the lateral view.  IMPRESSION: No active cardiopulmonary disease.   Electronically Signed   By: Dereck Ligas M.D.   On: 01/17/2015 15:58   Mr Jeri Cos XT Contrast  01/27/2015   CLINICAL DATA:  Confusion and declining mental status.  History of ovarian cancer.  EXAM: MRI HEAD WITHOUT AND WITH CONTRAST  TECHNIQUE: Multiplanar, multiecho pulse sequences of the brain and surrounding structures were obtained without and with intravenous contrast.  CONTRAST:  29mL MULTIHANCE GADOBENATE DIMEGLUMINE 529 MG/ML IV SOLN  COMPARISON:  03/22/2012  FINDINGS: Calvarium and upper cervical spine: No marrow signal abnormality.  Orbits: Left cataract resection.  Sinuses: Right mastoid opacification with fluid and probable mucosal thickening. Clear nasopharynx. Negative paranasal sinuses.  Brain: No acute abnormality such as infarct, hemorrhage, hydrocephalus, or mass lesion. No evidence of large vessel occlusion. T2 and FLAIR hyperintensity around the lateral ventricles consistent with mild for age small-vessel disease. No abnormal intracranial enhancement. Normal cerebral volume. No abnormal cortical diffusion.  IMPRESSION: 1. No acute intracranial finding. 2. Mild for age chronic small vessel disease. 3. Right mastoiditis.   Electronically Signed   By: Monte Fantasia M.D.   On: 01/27/2015 09:59   Ct Abdomen Pelvis W Contrast  01/17/2015   CLINICAL DATA:  Ovarian cancer. On chemotherapy. Generalized weakness and abdominal pain. UTI. Nausea vomiting diarrhea.  EXAM: CT ABDOMEN AND PELVIS WITH CONTRAST  TECHNIQUE: Multidetector CT imaging of the abdomen and pelvis was performed using the standard protocol following bolus administration of intravenous contrast.  CONTRAST:  75 mL Omnipaque 300 IV  COMPARISON:  Head 03/11/2014, CT abdomen pelvis 07/30/2013  FINDINGS: Lung bases are clear.  Large hiatal hernia.  Prior cholecystectomy. Common bile duct is dilated measuring 13.7 mm. This has progressed from the prior study. Common bile duct however is not significantly dilated at 6 mm. Correlate with LFT. No liver lesion. Pancreas and spleen are negative.  Extra renal pelvis on the right. No hydronephrosis. No renal mass. Small left renal cysts. Urinary bladder  normal.  Large and small bowel diffusely dilated with air-fluid levels. Fluid-filled colon and rectum. Findings suggest ileus or gastroenteritis.  Right adnexal cystic mass measures 38 x 30 mm. This is predominantly cystic but has a  solid component and is likely due to tumor. This is unchanged from the prior CT in 2014. Small omental soft tissue density deep to the umbilicus measures 1 cm and appears improved from the prior PET-CT. This showed hypermetabolic uptake on the PET-CT.  No free fluid.  No adenopathy.  Mild atherosclerotic aorta without aneurysm.  Scoliosis and lumbar degenerative change.  No acute bony abnormality  IMPRESSION: Findings compatible with ileus/ gastroenteritis. Large and small bowel dilated with air-fluid levels. Diffuse fluid-filled colon.  Common hepatic duct dilated but common bile duct nondilated. This may be related to cholecystectomy. Correlate with LFT.  Right adnexal cystic mass 38 x 30 mm compatible with stable tumor unchanged from prior studies. Omental nodule is slightly improved from the prior PET-CT.   Electronically Signed   By: Franchot Gallo M.D.   On: 01/17/2015 19:03   Dg Abdomen 3-way (incl Pa Cxr) ([facility] Hx)  01/24/2015   CLINICAL DATA:  Ileus with abdominal pain  EXAM: ABDOMEN SERIES  COMPARISON:  01/23/2015; 01/22/2015; CT abdomen pelvis - 01/17/2015  FINDINGS: Grossly unchanged cardiac silhouette and mediastinal contours. Stable position of support apparatus. The lungs remain hyperexpanded with flattening of the bilateral hemidiaphragms. Unchanged trace bilateral effusions with associated bibasilar opacities. No new focal airspace opacities. No evidence of edema. No pneumothorax.  Moderate to large colonic stool burden without evidence of enteric obstruction. No pneumoperitoneum, pneumatosis or portal venous gas. Small hiatal hernia is suspected.  Mild to moderate scoliotic curvature of the thoracolumbar spine.  IMPRESSION: 1. Similar findings of lung hyperexpansion  and chronic trace bilateral effusions without definite superimposed acute cardiopulmonary disease. 2. Moderate to large colonic stool burden without evidence of enteric obstruction.   Electronically Signed   By: Sandi Mariscal M.D.   On: 01/24/2015 08:19   Dg Abdomen 3-way (incl Pa Cxr) ([facility] Hx)  01/23/2015   CLINICAL DATA:  Abdominal pain.  History of cancer.  EXAM: ABDOMEN SERIES  COMPARISON:  01/22/2015  FINDINGS: No cardiomegaly. Stable aortic and hilar contours. A small hiatal hernia is again noted.  Right IJ porta catheter, tip at the upper cavoatrial junction.  Chronic hyperinflation. There is no edema, consolidation, effusion, or pneumothorax.  Unchanged gas dilated small bowel. There is a paucity of colonic distention, but based on admission CT this is likely a residual ileus rather than partial obstruction. Previously administered oral contrast is still seen at the level the rectum; none was retained in the small bowel. No pneumoperitoneum or evidence of pneumatosis.  IMPRESSION: 1. Unchanged gaseous dilation of small bowel, likely ileus given admission CT 01/17/2015. 2. Small hiatal hernia.   Electronically Signed   By: Monte Fantasia M.D.   On: 01/23/2015 13:27   Dg Abdomen 3-way (incl Pa Cxr) ([facility] Hx)  01/23/2015   CLINICAL DATA:  Acute lower abdominal pain.  EXAM: ABDOMEN SERIES  COMPARISON:  CT scan of January 17, 2015.  FINDINGS: Mildly dilated small bowel loops are noted which may simply represent ileus. Residual contrast is seen in the rectum. No colonic dilatation is noted. Stool is seen in descending colon. Phlebolith is seen in the pelvis. No free air is noted. Moderate hiatal hernia is noted. Minimal right basilar subsegmental atelectasis is noted. Right-sided Port-A-Cath is noted with distal tip in expected position of cavoatrial junction.  IMPRESSION: Mildly dilated small bowel loops are noted which may simply represent ileus, but followup radiographs are recommended to rule out  obstruction. Minimal right basilar subsegmental atelectasis is noted as well.  Electronically Signed   By: Marijo Conception, M.D.   On: 01/23/2015 08:19    ASSESSMENT:  Hallucinations and agitation was due to patient's deep and prolonged and chronic psychiatric issues and medication.  Improvement in hallucination, abdominal pain, delirium PLAN: Palliative care note has been evaluated Physiotherapy is assessment has been evaluated.  There is general improvement .  But patient needs to be in rehabilitation according to physiotherapy assessment  Hypertensin Better control. Norvasc will be added on a regular basis Patient expressed understanding and was in agreement with this plan. She also understands that She can call clinic at any time with any questions, concerns, or complaints.    Ovarian cancer   Staging form: Ovary, AJCC 7th Edition     Clinical stage from 05/16/2013: Stage IIIC (yT3c, N1, M0) - Signed by Evlyn Kanner, NP on 01/15/2015   Forest Gleason, MD   01/28/2015 8:46 AM

## 2015-01-28 NOTE — Plan of Care (Signed)
Problem: Discharge Progression Outcomes Goal: Other Discharge Outcomes/Goals Outcome: Progressing Plan of Care progress to Goal: Pt is much improved from yesterday.  No ativan or haldol given.  Pt was up to chair and ate meals.  Oriented To self, place and time today.  Wked w/PT - sd she has strength but she couldn't figure what to do next - how to move.  PT recommends rehab/snf.

## 2015-01-29 MED ORDER — QUETIAPINE FUMARATE 25 MG PO TABS
50.0000 mg | ORAL_TABLET | Freq: Every day | ORAL | Status: DC
  Administered 2015-01-29 – 2015-02-01 (×4): 50 mg via ORAL
  Filled 2015-01-29 (×4): qty 2

## 2015-01-29 MED ORDER — ENSURE ENLIVE PO LIQD
237.0000 mL | Freq: Two times a day (BID) | ORAL | Status: DC
  Administered 2015-01-31 – 2015-02-02 (×6): 237 mL via ORAL

## 2015-01-29 NOTE — Progress Notes (Signed)
Visit made as [facility]. Spoke with Jessica Avery, CM earlier today and she stated plans are for patient to discharge to a skilled nursing facility. Jessica Avery stated anticipated discharge would be tomorrow. Patient's son Jessica Avery states patient is more agitated this evening and requests the nurse, Velna Hatchet to assess. I contacted Brandi via phone and notified her of the son's request. Patient lying in bed and appears confused but pleasant during my visit. Patient's son Jessica Avery, patient's nurse Velna Hatchet, family dog and patient sitter at bedside were all in the room during my visit. The patient's sitter states the patient had a great appetite today. Jessica Avery updated me that he is aware the patient will be going to a facility at discharge. I explained to Jessica Avery and patient that I would follow up tomorrow on plans for discharge and if patient discharges from the hospital to a skilled nursing facility, we will discharge the patient from [place] home [facility]. Will continue to follow. [name], MSN, RN [facility] Address: [address] [place], [place] [address] Phone: [phone]

## 2015-01-29 NOTE — Progress Notes (Signed)
Physical Therapy Treatment Patient Details Name: Jessica Avery MRN: [id number] DOB: Oct 24, 1935 Today's Date: 01/29/2015    History of Present Illness Pt is a 79 y.o. female with ovarian cancer who presented to ER (4/29) with recurrent, intractible abdominal pain; admitted with ileus.  Hospital course complicated by delirium. Of note, patient with recent previous hospitalization (4/23-4/25) due to gastritis with discharge home with son.    PT Comments    Pt able to progress to 15 feet ambulation with RW with mod to max assist x1 plus assist for chair to follow and to manage dog that was in pt's room (dog initially ran out of room and then ran between therapist and pt (and also sat on therapists foot) while attempting to walk with pt so dog was placed on leash by sitter present in room.  Pt continues to demonstrate significant impairments with motor planning and balance requiring significant extra time, assist, and visual demo/cues to safely advance LE's for ambulation.  Continue to recommend pt discharge to STR d/t pt's level of assist required for functional mobility.  Follow Up Recommendations  SNF     Equipment Recommendations  Rolling walker with 5" wheels;3in1 (PT)    Recommendations for Other Services       Precautions / Restrictions Precautions Precautions: Fall Precaution Comments: R chest port Restrictions Weight Bearing Restrictions: No    Mobility  Bed Mobility Overal bed mobility: Needs Assistance Bed Mobility: Sit to Supine       Sit to supine: Min assist   General bed mobility comments: vc's required for technique  Transfers Overall transfer level: Needs assistance Equipment used: Rolling walker (2 wheeled)   Sit to Stand: Mod assist  Stand step turn chair to bed mod to max assist x1 no AD       General transfer comment: hand over hand assist for UE/LE placement; simple one step cues and tactile cues for technique  Ambulation/Gait Ambulation/Gait  assistance: Mod assist;Max assist;+2 safety/equipment (chair follow) Ambulation Distance (Feet): 15 Feet Assistive device: Rolling walker (2 wheeled)       General Gait Details: Posterior R weight shift with absent balance/righting reactions; unable to initiate automatic stepping pattern; utilized initially one step commands, then lines on floor, then visual demo/cues of foot placement; pt initially stepping on contralateral foot, crossing midline but improved with change in technique; pt unsafe and high fall risk.   Stairs            Wheelchair Mobility    Modified Rankin (Stroke Patients Only)       Balance Overall balance assessment: Needs assistance Sitting-balance support: No upper extremity supported Sitting balance-Leahy Scale: Fair     Standing balance support: Bilateral upper extremity supported;During functional activity   Standing balance comment: posterior R lean without righting reactions                    Cognition Arousal/Alertness: Awake/alert Behavior During Therapy: Anxious (appeared nervous) Overall Cognitive Status: Impaired/Different from baseline Area of Impairment: Orientation;Attention;Memory;Following commands;Safety/judgement;Awareness;Problem solving Orientation Level: Disoriented to;Place;Situation;Time Current Attention Level: Sustained Memory: Decreased short-term memory Following Commands: Follows one step commands inconsistently Safety/Judgement: Decreased awareness of safety;Decreased awareness of deficits   Problem Solving: Requires tactile cues;Requires verbal cues;Difficulty sequencing;Decreased initiation;Slow processing General Comments: simple one step commands required; poor motor planning and sequencing tasks    Exercises      General Comments        Pertinent Vitals/Pain Pain Assessment: No/denies pain  Vitals pre-activity: HR 94 bpm and O2 94% on room air; post activity: HR 113 bpm and O2 96% on room  air.  Home Living                      Prior Function            PT Goals (current goals can now be found in the care plan section) Acute Rehab PT Goals Patient Stated Goal: Pt did not verbalize when asked (anticipate d/t cognitive deficits) PT Goal Formulation: Patient unable to participate in goal setting Time For Goal Achievement: 02/11/15 Potential to Achieve Goals: Fair Additional Goals Additional Goal #1: Patient to complete bed mobility indep for self-repositioning and pulmonary hygiene Additional Goal #2: Patient to complete all basic transfers with LRAD, min assist, for access to all seating surfaces in home environment.  Progress towards PT goals: Progressing toward goals    Frequency  Min 2X/week    PT Plan Current plan remains appropriate    Co-evaluation             End of Session Equipment Utilized During Treatment: Gait belt Activity Tolerance: Patient tolerated treatment well Patient left: in bed;with call bell/phone within reach;with bed alarm set;with nursing/sitter in room     Time: 1455-1525 PT Time Calculation (min) (ACUTE ONLY): 30 min  Charges:  $Gait Training: 8-22 mins $Therapeutic Activity: 8-22 mins                    G CodesLeitha Bleak 2015/02/08, 4:00 PM Leitha Bleak, [place]

## 2015-01-29 NOTE — Clinical Social Work Placement (Signed)
   CLINICAL SOCIAL WORK PLACEMENT  NOTE  Date:  01/29/2015  Patient Details  Name: Jessica Avery MRN: [id number] Date of Birth: May 19, 1936  Clinical Social Work is seeking post-discharge placement for this patient at the IXL level of care (*CSW will initial, date and re-position this form in  chart as items are completed):  Yes   Patient/family provided with [place] Work Department's list of facilities offering this level of care within the geographic area requested by the patient (or if unable, by the patient's family).  No   Patient/family informed of their freedom to choose among providers that offer the needed level of care, that participate in Medicare, Medicaid or managed care program needed by the patient, have an available bed and are willing to accept the patient.  Yes   Patient/family informed of [facility]'s ownership interest in [facility] and [facility], as well as of the fact that they are under no obligation to receive care at these facilities.  PASRR submitted to EDS on 01/29/15     PASRR number received on 01/29/15     Existing PASRR number confirmed on       FL2 transmitted to all facilities in geographic area requested by pt/family on 01/29/15     FL2 transmitted to all facilities within larger geographic area on       Patient informed that his/her managed care company has contracts with or will negotiate with certain facilities, including the following:   (list provided- )         Patient/family informed of bed offers received.  Patient chooses bed at       Physician recommends and patient chooses bed at      Patient to be transferred to   on  .  Patient to be transferred to facility by       Patient family notified on   of transfer.  Name of family member notified:        PHYSICIAN Please sign FL2     Additional Comment:    _______________________________________________ Ludwig Clarks,  LCSW 01/29/2015, 3:09 PM

## 2015-01-29 NOTE — Progress Notes (Signed)
Nutrition Follow-up  INTERVENTION: Medical Nutrition supplement Therapy: Recommend Ensure Enlive (each supplement provides 350kcal and 20 grams of protein) BID Meals and Snacks: Cater to patient preferences  NUTRITION DIAGNOSIS: Inadequate oral intake related to chronic illness as evidenced by estimated needs, however improving  GOAL: Patient will meet greater than or equal to 90% of their needs, intake improved, ongoing goal   MONITOR: Energy Intake Anthropometrics Digestive system Electrolyte and Renal Profile   ASSESSMENT:  Pt sitting up in bed with sitter on visit.   PO Intake: Pt reports eating well today. CNA Judeen Hammans reports pt has eaten 100% of meals today. Recorded po intake 75-100% of meals yesterday and today.  Labs: Electrolyte and Renal Profile:    Recent Labs Lab 01/23/15 1213 01/24/15 0440 01/25/15 0454 01/25/15 1619  BUN 14 12  --   --   CREATININE 1.35* 1.17*  --   --   NA 138 140  --   --   K 4.0 4.1  --  4.9  MG  --   --  1.6*  --    Glucose Profile: No results for input(s): GLUCAP in the last 72 hours.  Protein Profile:  Recent Labs Lab 01/23/15 1213  ALBUMIN 3.2*   Medications: fentanyl, lactulose, protonix, Senokot, Geodon injection, NS with KCl at 55mL/hr  Height:  Ht Readings from Last 1 Encounters:  01/14/15 5\' 5"  (1.651 m)    Weight:  Wt Readings from Last 1 Encounters:  01/27/15 128 lb (58.06 kg)   Wt Readings from Last 10 Encounters:  01/27/15 128 lb (58.06 kg)  01/14/15 128 lb 8.4 oz (58.299 kg)    BMI:  There is no weight on file to calculate BMI.  Estimated Nutritional Needs:  Kcal:  1480-1789kcals, BEE: 1120kcals, (IF1.1-1.3)(AF 1.2)   Protein:  63-76g protein (1.0-1.2g/kg)  Fluid:  1575-1818mL of fluid (25-69mL/kg)  Diet Order:  Diet regular Room service appropriate?: Yes; Fluid consistency:: Thin   Intake/Output Summary (Last 24 hours) at 01/29/15 1546 Last data filed at 01/29/15 1300  Gross per 24 hour   Intake   1050 ml  Output   1200 ml  Net   -150 ml    Last BM:  Loose BM documented today  [place], RD, LDN Pager [phone]

## 2015-01-29 NOTE — Consult Note (Signed)
  Psychiatry: Follow-up for this woman with a history of major depression and ongoing delirium. On interview today the patient was interviewed and I also got to speak with her son ,patient herself stated that she felt that she was doing better. Still had some abdominal pain. Still felt weak. She felt like her mood was doing better. She denied having any hallucinations. After talking for a while however it became clear that she remained disoriented.  On review of systems as mentioned she says her depression is much less. Feels less anxious. Denies hallucinations. Moderate pain still. Not eating as well as she probably should.  On mental status this is a chronically and acutely ill woman interviewed in a hospital room. Eye contact good. Psychomotor activity appropriate. Speech was decreased in total amount. Affect initially was euthymic but when I told her she was in the hospital she became panicky and anxious. Patient was clearly disoriented and did not realize she was in the hospital. We could not really get through to her to explain that she had been in the hospital for several days and that she was here for a medical condition. Eventually she did calm down a bit. She is not sleeping very well.  Diagnosis is delirium multifactorial as well as a history of recurrent depression and anxiety. Pain medication and benzodiazepines both seemed to worsen her confusion.  Plan is to add Seroquel at a modest dose of 50 mg tonight to help with sleep and perhaps with confusion. Continue when necessary haloperidol for delirium during the day. Continue antidepressants. Follow-up as needed.

## 2015-01-29 NOTE — Plan of Care (Signed)
Problem: Discharge Progression Outcomes Goal: Other Discharge Outcomes/Goals Outcome: Progressing Pt remains confused at times, family and family dog at bedside, pt c/o abdominal pain improved with hydrocodone, fentanyl patch in place, on room air, good appetite, up to bsc with one assist, bm throughout shift, potassium serum has normalized, pt continues to receive IV fluids, awaiting on SNF placement for discharge. Physical therapy ambulated with patient. Blood pressure elevated, amlodipine given prn.

## 2015-01-29 NOTE — Progress Notes (Signed)
[place] @ [facility] Telephone:(336) [phone]  Fax:(336) [place]: [birth year]  MR#: [id number]  KGU#:[id number]  No care team member to display  CHIEF COMPLAINT: No chief complaint on file.  Patient was admitted in the hospital with delirium and confusion abdominal pain which was uncontrolled   Peritoneal carcinomatosis   01/15/2015 Initial Diagnosis Peritoneal carcinomatosis    Oncology Flowsheet 01/27/2015 01/27/2015 01/28/2015 01/28/2015 01/28/2015 01/29/2015 01/29/2015  ALPRAZolam (XANAX) PO 1 mg   1 mg 1 mg 1 mg 1 mg 1 mg  LORazepam (ATIVAN) IV 1 mg 2 mg - - - - -  OLANZapine (ZYPREXA) PO - - - - - - -    INTERVAL HISTORY:  Patient   remains still somewhat confused but very pleasant this morning.  Abdominal pain is improved.  Physiotherapy has decided that patient needs to be placed in skilled nursing division for rehabilitation as patient is not walking and taking care of herself. REVIEW OF SYSTEMS:   ROS patient was very  Agitated ,hallucinating. Abdominal pain has improved.  No nausea.  No vomiting  Had a bowel movement. As per HPI. Otherwise, a complete review of systems is negatve.  PAST MEDICAL HISTORY: Past Medical History  Diagnosis Date  . Peritoneal carcinomatosis 01/15/2015  . Ovarian cancer   . Diabetes mellitus without complication   . Anemia   . GERD (gastroesophageal reflux disease)   . Irregular cardiac rhythm   . Chronic back pain   . Depression     PAST SURGICAL HISTORY: Past Surgical History  Procedure Laterality Date  . Abdominal hysterectomy    . Appendectomy    . Cholecystectomy    . Heel spur surgery    . Tubal ligation      FAMILY HISTORY History reviewed. No pertinent family history.  GYNECOLOGIC HISTORY:  No LMP recorded.     ADVANCED DIRECTIVES:    HEALTH MAINTENANCE: History  Substance Use Topics  . Smoking status: Former Research scientist (life sciences)  . Smokeless tobacco: Not on file  . Alcohol Use: Not on file      Colonoscopy:  PAP:  Bone density:  Lipid panel:  Allergies  Allergen Reactions  . Macrodantin [Nitrofurantoin] Hives and Itching  . Morphine And Related Other (See Comments)    Ineffective for pain   . Tequin [Gatifloxacin] Other (See Comments)    Unknown reaction    Current Facility-Administered Medications  Medication Dose Route Frequency Provider Last Rate Last Dose  . 0.9 % NaCl with KCl 20 mEq/ L  infusion   Intravenous Continuous Doctor Chlconversion, MD 60 mL/hr at 01/29/15 0919    . ALPRAZolam (XANAX) tablet 1 mg  1 mg Oral 3 times per day Doctor Chlconversion, MD   1 mg at 01/29/15 1422  . amLODipine (NORVASC) tablet 10 mg  10 mg Oral Daily Forest Gleason, MD   10 mg at 01/29/15 0916  . amLODipine (NORVASC) tablet 5 mg  5 mg Oral PRN Forest Gleason, MD   5 mg at 01/29/15 1423  . desvenlafaxine (PRISTIQ) 24 hr tablet 100 mg  100 mg Oral Daily Leia Alf, MD   50 mg at 01/28/15 1933  . feeding supplement (ENSURE ENLIVE) (ENSURE ENLIVE) liquid 237 mL  237 mL Oral BID BM Forest Gleason, MD   237 mL at 01/29/15 1749  . fentaNYL (DURAGESIC - dosed mcg/hr) 50 mcg  50 mcg Transdermal Q72H Doctor Chlconversion, MD   50 mcg at 01/29/15 1142  . haloperidol lactate (HALDOL)  injection 2 mg  2 mg Intravenous Q2H PRN Gonzella Lex, MD   2 mg at 01/27/15 1536  . hydrochlorothiazide (HYDRODIURIL) tablet 25 mg  25 mg Oral Daily Doctor Chlconversion, MD   25 mg at 01/29/15 0916  . HYDROcodone-acetaminophen (NORCO) 10-325 MG per tablet 1-2 tablet  1-2 tablet Oral Q4H PRN [place] Jack Phifer, MD   2 tablet at 01/29/15 0747  . lactulose (CHRONULAC) 10 GM/15ML solution 20 g  20 g Oral Q12H Dallas Schimke, MD   20 g at 01/29/15 0916  . lisinopril (PRINIVIL,ZESTRIL) tablet 40 mg  40 mg Oral Daily Doctor Chlconversion, MD   40 mg at 01/29/15 0916  . ondansetron (ZOFRAN) injection 4 mg  4 mg Intravenous Q4H PRN Doctor Chlconversion, MD      . pantoprazole (PROTONIX) EC tablet 40 mg  40 mg Oral Q0600  Doctor Chlconversion, MD   40 mg at 01/29/15 4193  . polyethylene glycol (MIRALAX / GLYCOLAX) packet 17 g  17 g Oral Daily PRN Doctor Chlconversion, MD   17 g at 01/25/15 0853  . QUEtiapine (SEROQUEL) tablet 50 mg  50 mg Oral QHS Gonzella Lex, MD      . senna-docusate (Senokot-S) tablet 1 tablet  1 tablet Oral Q12H Doctor Chlconversion, MD   1 tablet at 01/29/15 [phone]  . sodium chloride 0.9 % injection 10-40 mL  10-40 mL Intracatheter Q12H Forest Gleason, MD   10 mL at 01/28/15 2200  . sodium chloride 0.9 % injection 10-40 mL  10-40 mL Intracatheter PRN Forest Gleason, MD      . ziprasidone (GEODON) injection 10 mg  10 mg Intramuscular Once Gonzella Lex, MD   10 mg at 01/29/15 0907    OBJECTIVE: Filed Vitals:   01/29/15 1627  BP: 153/76  Pulse:   Temp:   Resp:      There is no weight on file to calculate BMI.    ECOG FS0:2 - Symptomatic, <50% confined to bed  Physical Exam  Constitutional: She is oriented to person, place, and time and well-developed, well-nourished, and in no distress. No distress.  HENT:  Head: Normocephalic and atraumatic.  Right Ear: External ear normal.  Left Ear: External ear normal.  Nose: Nose normal.  Mouth/Throat: Oropharynx is clear and moist.  Eyes: Conjunctivae and EOM are normal.  Neck: Normal range of motion. No JVD present. No tracheal deviation present. No thyromegaly present.  Cardiovascular: Normal rate and regular rhythm.  Exam reveals no friction rub.   No murmur heard. Pulmonary/Chest: No stridor. No respiratory distress. She has no wheezes. She has no rales. She exhibits no tenderness.  Abdominal: She exhibits no distension and no mass. There is no tenderness. There is no rebound.  Musculoskeletal: She exhibits no edema or tenderness.  Neurological: She is alert and oriented to person, place, and time. She has normal reflexes. Gait normal. GCS score is 15.  Patient was somewhat delirious   and hallucinating  Skin: Skin is warm and dry. No rash  noted. She is not diaphoretic.  Psychiatric: Mood, memory and affect normal.     LAB RESULTS:     Component Value Date/Time   NA 140 01/24/2015 0440   K 4.9 01/25/2015 1619   K 4.1 01/24/2015 0440   CL 111 01/24/2015 0440   CO2 24 01/24/2015 0440   GLUCOSE 73 01/24/2015 0440   BUN 12 01/24/2015 0440   CREATININE 1.17* 01/24/2015 0440   CREATININE 1.35 01/14/2015   CALCIUM  8.5* 01/24/2015 0440   PROT 5.6* 01/23/2015 1213   ALBUMIN 3.2* 01/23/2015 1213   AST 23 01/23/2015 1213   ALT 16 01/23/2015 1213   ALKPHOS 46 01/23/2015 1213   GFRNONAA 45* 01/24/2015 0440   GFRAA 52* 01/24/2015 0440    No results found for: SPEP, UPEP  Lab Results  Component Value Date   WBC 4.2 01/25/2015   NEUTROABS 2.7 01/25/2015   HGB 7.7* 01/25/2015   HCT 22.8* 01/25/2015   MCV 104.9* 01/25/2015   PLT 105* 01/25/2015    @LASTCHEMISTRY @  No results found for: LABCA2  No components found for: LABCA125  No results for input(s): INR in the last 168 hours.  No results found for: COLORURINE, APPEARANCEUR, LABSPEC, PHURINE, GLUCOSEU, HGBUR, BILIRUBINUR, KETONESUR, PROTEINUR, UROBILINOGEN, NITRITE, LEUKOCYTESUR  STUDIES: Dg Chest 2 View  01/17/2015   CLINICAL DATA:  cough, fever  EXAM: CHEST  2 VIEW  COMPARISON:  PET-CT 04/2014.  FINDINGS: The cardiopericardial silhouette is within normal limits. Tortuous thoracic aorta. Unchanged RIGHT IJ Port-A-Cath with the tip at the cavoatrial junction. Buttons project over the chest. Monitoring leads project over the chest. No airspace disease. No pleural effusion. Hiatal hernia is present with fluid level in the intrathoracic stomach on the lateral view.  IMPRESSION: No active cardiopulmonary disease.   Electronically Signed   By: Dereck Ligas M.D.   On: 01/17/2015 15:58   Mr Jeri Cos GX Contrast  01/27/2015   CLINICAL DATA:  Confusion and declining mental status. History of ovarian cancer.  EXAM: MRI HEAD WITHOUT AND WITH CONTRAST  TECHNIQUE:  Multiplanar, multiecho pulse sequences of the brain and surrounding structures were obtained without and with intravenous contrast.  CONTRAST:  23mL MULTIHANCE GADOBENATE DIMEGLUMINE 529 MG/ML IV SOLN  COMPARISON:  03/22/2012  FINDINGS: Calvarium and upper cervical spine: No marrow signal abnormality.  Orbits: Left cataract resection.  Sinuses: Right mastoid opacification with fluid and probable mucosal thickening. Clear nasopharynx. Negative paranasal sinuses.  Brain: No acute abnormality such as infarct, hemorrhage, hydrocephalus, or mass lesion. No evidence of large vessel occlusion. T2 and FLAIR hyperintensity around the lateral ventricles consistent with mild for age small-vessel disease. No abnormal intracranial enhancement. Normal cerebral volume. No abnormal cortical diffusion.  IMPRESSION: 1. No acute intracranial finding. 2. Mild for age chronic small vessel disease. 3. Right mastoiditis.   Electronically Signed   By: Monte Fantasia M.D.   On: 01/27/2015 09:59   Ct Abdomen Pelvis W Contrast  01/17/2015   CLINICAL DATA:  Ovarian cancer. On chemotherapy. Generalized weakness and abdominal pain. UTI. Nausea vomiting diarrhea.  EXAM: CT ABDOMEN AND PELVIS WITH CONTRAST  TECHNIQUE: Multidetector CT imaging of the abdomen and pelvis was performed using the standard protocol following bolus administration of intravenous contrast.  CONTRAST:  75 mL Omnipaque 300 IV  COMPARISON:  Head 03/11/2014, CT abdomen pelvis 07/30/2013  FINDINGS: Lung bases are clear.  Large hiatal hernia.  Prior cholecystectomy. Common bile duct is dilated measuring 13.7 mm. This has progressed from the prior study. Common bile duct however is not significantly dilated at 6 mm. Correlate with LFT. No liver lesion. Pancreas and spleen are negative.  Extra renal pelvis on the right. No hydronephrosis. No renal mass. Small left renal cysts. Urinary bladder normal.  Large and small bowel diffusely dilated with air-fluid levels. Fluid-filled  colon and rectum. Findings suggest ileus or gastroenteritis.  Right adnexal cystic mass measures 38 x 30 mm. This is predominantly cystic but has a solid component and is likely  due to tumor. This is unchanged from the prior CT in 2014. Small omental soft tissue density deep to the umbilicus measures 1 cm and appears improved from the prior PET-CT. This showed hypermetabolic uptake on the PET-CT.  No free fluid.  No adenopathy.  Mild atherosclerotic aorta without aneurysm.  Scoliosis and lumbar degenerative change.  No acute bony abnormality  IMPRESSION: Findings compatible with ileus/ gastroenteritis. Large and small bowel dilated with air-fluid levels. Diffuse fluid-filled colon.  Common hepatic duct dilated but common bile duct nondilated. This may be related to cholecystectomy. Correlate with LFT.  Right adnexal cystic mass 38 x 30 mm compatible with stable tumor unchanged from prior studies. Omental nodule is slightly improved from the prior PET-CT.   Electronically Signed   By: Franchot Gallo M.D.   On: 01/17/2015 19:03   Dg Abdomen 3-way (incl Pa Cxr) ([facility] Hx)  01/24/2015   CLINICAL DATA:  Ileus with abdominal pain  EXAM: ABDOMEN SERIES  COMPARISON:  01/23/2015; 01/22/2015; CT abdomen pelvis - 01/17/2015  FINDINGS: Grossly unchanged cardiac silhouette and mediastinal contours. Stable position of support apparatus. The lungs remain hyperexpanded with flattening of the bilateral hemidiaphragms. Unchanged trace bilateral effusions with associated bibasilar opacities. No new focal airspace opacities. No evidence of edema. No pneumothorax.  Moderate to large colonic stool burden without evidence of enteric obstruction. No pneumoperitoneum, pneumatosis or portal venous gas. Small hiatal hernia is suspected.  Mild to moderate scoliotic curvature of the thoracolumbar spine.  IMPRESSION: 1. Similar findings of lung hyperexpansion and chronic trace bilateral effusions without definite superimposed acute  cardiopulmonary disease. 2. Moderate to large colonic stool burden without evidence of enteric obstruction.   Electronically Signed   By: Sandi Mariscal M.D.   On: 01/24/2015 08:19   Dg Abdomen 3-way (incl Pa Cxr) ([facility] Hx)  01/23/2015   CLINICAL DATA:  Abdominal pain.  History of cancer.  EXAM: ABDOMEN SERIES  COMPARISON:  01/22/2015  FINDINGS: No cardiomegaly. Stable aortic and hilar contours. A small hiatal hernia is again noted.  Right IJ porta catheter, tip at the upper cavoatrial junction.  Chronic hyperinflation. There is no edema, consolidation, effusion, or pneumothorax.  Unchanged gas dilated small bowel. There is a paucity of colonic distention, but based on admission CT this is likely a residual ileus rather than partial obstruction. Previously administered oral contrast is still seen at the level the rectum; none was retained in the small bowel. No pneumoperitoneum or evidence of pneumatosis.  IMPRESSION: 1. Unchanged gaseous dilation of small bowel, likely ileus given admission CT 01/17/2015. 2. Small hiatal hernia.   Electronically Signed   By: Monte Fantasia M.D.   On: 01/23/2015 13:27   Dg Abdomen 3-way (incl Pa Cxr) ([facility] Hx)  01/23/2015   CLINICAL DATA:  Acute lower abdominal pain.  EXAM: ABDOMEN SERIES  COMPARISON:  CT scan of January 17, 2015.  FINDINGS: Mildly dilated small bowel loops are noted which may simply represent ileus. Residual contrast is seen in the rectum. No colonic dilatation is noted. Stool is seen in descending colon. Phlebolith is seen in the pelvis. No free air is noted. Moderate hiatal hernia is noted. Minimal right basilar subsegmental atelectasis is noted. Right-sided Port-A-Cath is noted with distal tip in expected position of cavoatrial junction.  IMPRESSION: Mildly dilated small bowel loops are noted which may simply represent ileus, but followup radiographs are recommended to rule out obstruction. Minimal right basilar subsegmental atelectasis is noted as well.    Electronically Signed   By:  Marijo Conception, M.D.   On: 01/23/2015 08:19    ASSESSMENT:  delirium is improved.  Abdominal pain persist. Physiotherapy note has been reviewed.  Psychiatrist's note has been reviewed. At this point in time I will discharge nurses are looking for placement PLAN: Overall improvement in delirium and hallucination Continue follow-up with psychiatrist Await placement    Ovarian cancer   Staging form: Ovary, AJCC 7th Edition     Clinical stage from 05/16/2013: Stage IIIC (yT3c, N1, M0) - Signed by Evlyn Kanner, NP on 01/15/2015   Forest Gleason, MD   01/29/2015 6:59 PM

## 2015-01-29 NOTE — Progress Notes (Signed)
Visit made as [facility]. I met with Jessica Avery, patient's nurse, and she stated that patient is a lot better today than yesterday. Jessica Avery stated Palliative Care has been consulted and has stopped patient's Ativan and that appeared to have improve patient's symptoms today. Jessica Avery stated patient had a CT scan of the head that was negative. Jessica Avery stated patient has gotten up to the chair and worked with PT today. Patient currently lives at home with her son. Patient also has two daughters per [facility]. Patient is currently sitting in a chair at the bedside with a hospital sitter in the room and a female described as her son lying in the hospital bed with eyes closed. I explained to patient and sitter that I was from [facility] and that we would be checking on her while she was in the hospital. Patient stated she was "better today than yesterday." No plans for discharge at this time. Reminded Jessica Avery that we would need resumption of care orders at discharge. I updated Jessica Avery in referral intake. Will continue to follow. [name], MSN, RN [facility] Address: [address] [place], [place] [address] Phone: [phone].

## 2015-01-29 NOTE — Clinical Social Work Note (Signed)
Clinical Social Work Assessment  Patient Details  Name: Jessica Avery MRN: [id number] Date of Birth: 1935/11/08  Date of referral:  01/29/15               Reason for consult:  Facility Placement                Permission sought to share information with:  Family Supports, Chartered certified accountant granted to share information::  Yes, Verbal Permission Granted  Name::      [name] (daughter) and Dominica Severin (son))  Agency::   (area SNF's)  Relationship::   (adult children)  Contact Information:     Housing/Transportation Living arrangements for the past 2 months:  Single Family Home Source of Information:  Patient, Adult Children Patient Interpreter Needed:  None Criminal Activity/Legal Involvement Pertinent to Current Situation/Hospitalization:  No - Comment as needed Significant Relationships:  Adult Children, Community Support Lives with:    Do you feel safe going back to the place where you live?  Yes Need for family participation in patient care:  Yes (Comment)  Care giving concerns:  PT recommending SNF at dc.   Social Worker assessment / plan:  CSW met with patient and her chlidren (son and daughter) at bedside. Patient was in good spirits; joking about her hair and it being messy- "it's days like this I am glad I have a wig to put on".  CSW introduced self and role- patient reports her son lives with her here locally- there are 4 children who all live nearby- the daughter, [name], is [place].  Discussed recommendations for SNF rehab at dc- they are all agreeable to this. CSW discussed area SNF's, insurance coverage, etc with them. CSW will complete FL2 and PASARR for SNF search.  Employment status:  Retired Nurse, adult PT Recommendations:  [place] / Referral to community resources:     Patient/Family's Response to care:  Patient and family are agreeable to plans for SNF search- Patient voicing hope in becoming  stronger "in my legs" so she can return home and be with   Patient/Family's Understanding of and Emotional Response to Diagnosis, Current Treatment, and Prognosis:  Patient shared with CSW, "I have ovarian cancer". She shared that she is undergoing chemo and is hopeful but appears realistic. Patient's daughter shared with CSW that her mother has had severe depression for many years requiring medications and ECT shock treatments. "the cancer and the depression haven't mixed well". She feels the depression is impeding her overall cancer prognosis and is concerned that she will do best in a private room at SNF. "she spends a lot of time in bed crying". CSW offered support and understanding and will seek a private room for her if possible.   Emotional Assessment Appearance:  Appears older than stated age Attitude/Demeanor/Rapport:  Other Affect (typically observed):  Appropriate, Quiet Orientation:  Oriented to Self, Oriented to Place, Oriented to  Time Alcohol / Substance use:  Not Applicable Psych involvement (Current and /or in the community):  Yes (Comment), Outpatient Provider  Discharge Needs  Concerns to be addressed:  Discharge Planning Concerns Readmission within the last 30 days:  No Current discharge risk:  Physical Impairment Barriers to Discharge:  No Barriers Identified   Ludwig Clarks, LCSW 01/29/2015, 2:49 PM

## 2015-01-29 NOTE — Consult Note (Signed)
Palliative Medicine Inpatient Consult Follow Up Note   Name: Jessica Avery Date: 01/29/2015 MRN: [id number]  DOB: [birth year]  Referring Physician: Forest Gleason, MD  Palliative Care consult requested for this 79 y.o. female for goals of medical therapy in patient with stage IV ovarian cancer with peritoneal carcinomatosis, MDD, DM, admitted with abd pain. Hospital course complicated by delirium.    Jessica Avery is sitting up in bed. Alert though still somewhat confused. Eating well. Can't remember if she worked with PT or not. Family not present. Sitter at bedside.  REVIEW OF SYSTEMS:  Pain: Mild Dyspnea:  No Nausea/Vomiting:  No Diarrhea:  No Constipation:   No Depression:   No Anxiety:   Yes Fatigue:   Yes  CODE STATUS: Full code   PAST MEDICAL HISTORY: Past Medical History  Diagnosis Date  . Peritoneal carcinomatosis 01/15/2015  . Ovarian cancer   . Diabetes mellitus without complication   . Anemia   . GERD (gastroesophageal reflux disease)   . Irregular cardiac rhythm   . Chronic back pain   . Depression     PAST SURGICAL HISTORY:  Past Surgical History  Procedure Laterality Date  . Abdominal hysterectomy    . Appendectomy    . Cholecystectomy    . Heel spur surgery    . Tubal ligation      Vital Signs: BP 179/79 mmHg  Pulse 76  Temp(Src) 98.5 F (36.9 C) (Oral)  Resp 16  SpO2 96% There were no vitals filed for this visit.  Estimated body mass index is 21.39 kg/(m^2) as calculated from the following:   Height as of 01/14/15: 5\' 5"  (1.651 m).   Weight as of 01/16/15: 58.299 kg (128 lb 8.4 oz).  PHYSICAL EXAM: General appearance: alert and no distress Head: Normocephalic, without obvious abnormality, atraumatic, OP clear Neck: supple, symmetrical, trachea midline Resp: clear to auscultation bilaterally Cardio: regular rate and rhythm, S1, S2 normal, no murmur, click, rub or gallop GI: tender to palpation, + BS's Extremities: no edema, redness or tenderness in  the calves or thighs Neurologic: Grossly normal  LABS: CBC:    Component Value Date/Time   WBC 4.2 01/25/2015 1619   WBC 3.8 01/24/2015 0440   HGB 7.7* 01/25/2015 1619   HGB 7.7* 01/24/2015 0440   HCT 22.8* 01/25/2015 1619   HCT 22.3* 01/24/2015 0440   PLT 105* 01/25/2015 1619   PLT 118* 01/24/2015 0440   MCV 104.9* 01/25/2015 1619   MCV 104* 01/24/2015 0440   NEUTROABS 2.7 01/25/2015 1619   NEUTROABS 1.9 01/24/2015 0440   LYMPHSABS 0.8* 01/25/2015 1619   LYMPHSABS 1.3 01/24/2015 0440   MONOABS 0.6 01/25/2015 1619   MONOABS 0.5 01/24/2015 0440   EOSABS 0.1 01/25/2015 1619   EOSABS 0.1 01/24/2015 0440   BASOSABS 0.0 01/25/2015 1619   BASOSABS 0.0 01/24/2015 0440   Comprehensive Metabolic Panel:    Component Value Date/Time   NA 140 01/24/2015 0440   K 4.9 01/25/2015 1619   K 4.1 01/24/2015 0440   CL 111 01/24/2015 0440   CO2 24 01/24/2015 0440   BUN 12 01/24/2015 0440   CREATININE 1.17* 01/24/2015 0440   CREATININE 1.35 01/14/2015   GLUCOSE 73 01/24/2015 0440   CALCIUM 8.5* 01/24/2015 0440   AST 23 01/23/2015 1213   ALT 16 01/23/2015 1213   ALKPHOS 46 01/23/2015 1213   PROT 5.6* 01/23/2015 1213   ALBUMIN 3.2* 01/23/2015 1213    IMPRESSION: Jessica Avery is a 79 y.o. woman with stage  IV ovarian cancer with peritoneal carcinomatosis, MDD, DM, admitted with abd pain. Hospital course complicated by delirium.    [name] delirium much improved though still somewhat confused. Probable d/c to SNF for STR when ready. Will try to speak with family today to see if they have discussed pt's code status.   PLAN: Family meeting if available  More than 50% of the visit was spent in counseling/coordination of care: YES  Time spent: 25 minutes

## 2015-01-30 DIAGNOSIS — Z90710 Acquired absence of both cervix and uterus: Secondary | ICD-10-CM

## 2015-01-30 MED ORDER — ALPRAZOLAM 1 MG PO TABS
1.0000 mg | ORAL_TABLET | Freq: Three times a day (TID) | ORAL | Status: DC
Start: 2015-01-30 — End: 2015-03-06

## 2015-01-30 MED ORDER — LISINOPRIL 40 MG PO TABS: 40.0000 mg | ORAL_TABLET | Freq: Every day | ORAL | Status: DC

## 2015-01-30 MED ORDER — FENTANYL 50 MCG/HR TD PT72: 50.0000 ug | MEDICATED_PATCH | TRANSDERMAL | Status: DC

## 2015-01-30 MED ORDER — POLYETHYLENE GLYCOL 3350 17 G PO PACK: 17.0000 g | PACK | Freq: Every day | ORAL | Status: DC | PRN

## 2015-01-30 MED ORDER — AMLODIPINE BESYLATE 10 MG PO TABS: 10.0000 mg | ORAL_TABLET | Freq: Every day | ORAL | Status: AC

## 2015-01-30 MED ORDER — HYDROCHLOROTHIAZIDE 25 MG PO TABS: 25.0000 mg | ORAL_TABLET | Freq: Every day | ORAL | Status: DC

## 2015-01-30 MED ORDER — QUETIAPINE FUMARATE 50 MG PO TABS: 50.0000 mg | ORAL_TABLET | Freq: Every day | ORAL | Status: AC

## 2015-01-30 MED ORDER — SENNOSIDES-DOCUSATE SODIUM 8.6-50 MG PO TABS: 1.0000 | ORAL_TABLET | Freq: Two times a day (BID) | ORAL | Status: AC

## 2015-01-30 MED ORDER — HYDROCODONE-ACETAMINOPHEN 10-325 MG PO TABS: 1.0000 | ORAL_TABLET | ORAL | Status: DC | PRN

## 2015-01-30 MED ORDER — DESVENLAFAXINE SUCCINATE ER 100 MG PO TB24: 100.0000 mg | ORAL_TABLET | Freq: Every day | ORAL | Status: DC

## 2015-01-30 NOTE — Consult Note (Signed)
  Psychiatry: Follow-up for this patient with depression and anxiety and delirium. On interview the patient says she is feeling much better. She is not having as much confusion. Her mood is calm her and she is less anxious.  On review of systems she reports that she slept a great deal last night and didn't get up until the afternoon. Last night she was still having some paranoid delusions but those have resolved during the day. She denies having any suicidal thoughts. No report of hallucinations. Still weak.  On mental status exam she is awake alert and interactive. Much more pleasant than yesterday. Affect more appropriate and looks calm her. Denies suicidal ideation. Mood reported as better. She is fully alert and oriented to her situation place and time.  Patient is improving. Seroquel may have been very sedating last night. I will not increase the dose tonight but expect that she will probably get tolerant to it. Continue current psychiatric medicine. No change to treatment plan.

## 2015-01-30 NOTE — Progress Notes (Signed)
D/C summary (without meds) and AVS with meds along with Rx faxed to [facility]. Packet complete and RN to call report. [name] son at bedside. CSW signing off as no other needs identified. [name], LCSW [phone] (1C coverage)

## 2015-01-30 NOTE — Progress Notes (Addendum)
Update from Leslie, patient will not be discharging today. [facility] would not accept patient until she has been 24 hours without a Actuary. Patient's son Dominica Severin is aware and may also be considering private pay sitter options. [name], CM gave son Dominica Severin a list of personal care sitter options. Will continue to follow. [name], MSN, RN [facility] Address: [address] [place], [place] [address] Phone: [phone]

## 2015-01-30 NOTE — Discharge Summary (Signed)
Patient was admitted on 4/29/, 2014 Patient was discharged on 01-30-2015 Final diagnosis Uncontrolled abdominal pain secondary to constipation and at peritentorial carcinomatosis Delirium and confusion secondary to multiple factors including severe previous depression and use of benzodiazepine . 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  Hospital stay: During hospital stay patient was started on intravenous pain medication.  Hospitalist consultation was obtained surgical consultation was obtained patient was treated for constipation.  Patient became extremely confused disoriented.  Psychiatric consultation suggested use of Geodon palliative care consult discuss at length about overall patient prognosis.  And discussed with the family about living will. Gradually patient's condition improved pain was getting better on present brain program.  Patient had hypertension which was controlled by adding Norvasc along with lisinopril.. Overall patient's prognosis is poor.  Physiotherapy evaluation suggested that rehabilitation physiotherapy might be useful.  So patient is now being discharged to the rehabilitation facility.   Disscharge medication;please see additional order

## 2015-01-30 NOTE — Progress Notes (Signed)
Per Marden Noble in admissions with [facility], they are unable to accept pt until 24 hours of no sitter. RN aware. [facility] is able to accept pt over weekend if no sitters for 24 hours. CSW will follow. [name], MSW, LCSW [phone] (weekend coverage)

## 2015-01-30 NOTE — Progress Notes (Signed)
Notify Georgeanne Nim NP that pt's blood pressure is 177/82 with hr of 105, and that pt is too drowsy to take po medicines. Magda Paganini acknowledged and does not wish to give her any medicines to lower bp because this is close to her "normal" bp.

## 2015-01-30 NOTE — Plan of Care (Addendum)
Problem: Discharge Progression Outcomes Goal: Other Discharge Outcomes/Goals Outcome: Not Progressing Pt remained drowsy the first part of the shift, towards the afternoon patient became awake and combative, pt reports she is "scared" and wants to go home. Pt is up to bsc, sitter at bedside, poor appetite, afebrile throughout shift. BP slightly elevated, Magda Paganini NP aware, morning meds given in evening. Plan was to d/c pt to facility but patient can not d/c until she has been 24 hours without sitter. At this time it is not appropriate to d/c sitter. Son at bedside. IV fluids continue to infuse.

## 2015-01-30 NOTE — Progress Notes (Signed)
[facility] Liaison visit made. Patient discharging to [facility] today per nurse S[place] and [place]. Patient received Haldol last night per [facility] and patient was sleeping more this morning but is now more agitated. During my visit, patients son Jessica Avery and sitter are at the bedside. Patient appears more agitated today and is on the phone talking to her sister during my visit. Patient's appetite is poor today per sitter and patient's son. Chaplain also arrived during my visit. I explained to patients son Jessica Avery that patient will be discharged from [facility] in order to pursue skilled care at [facility]. Discharge today is still not certain due to increase in agitation symptoms. [name] CM, [name], CSW and I had a brief discussion in regards to a possible palliative care consult at the facility as it might be beneficial for symptom management with this patient and palliative care could exist with skilled nursing care. [name], CSW to follow up. Patient information faxed to referral intake. [name], MSN, RN [facility] Address: [address] [place] [address] Phone: [phone]

## 2015-01-30 NOTE — Clinical Social Work Placement (Signed)
   CLINICAL SOCIAL WORK PLACEMENT  NOTE  Date:  01/30/2015  Patient Details  Name: Jessica Avery MRN: [id number] Date of Birth: [birth year]  Clinical Social Work is seeking post-discharge placement for this patient at the [place] level of care (*CSW will initial, date and re-position this form in  chart as items are completed):  Yes   Patient/family provided with [place] Work Department's list of facilities offering this level of care within the geographic area requested by the patient (or if unable, by the patient's family).  No   Patient/family informed of their freedom to choose among providers that offer the needed level of care, that participate in Medicare, Medicaid or managed care program needed by the patient, have an available bed and are willing to accept the patient.  Yes   Patient/family informed of [facility]'s ownership interest in [facility] and [facility], as well as of the fact that they are under no obligation to receive care at these facilities.  PASRR submitted to EDS on 01/29/15     PASRR number received on 01/29/15     Existing PASRR number confirmed on 01/29/15     FL2 transmitted to all facilities in geographic area requested by pt/family on 01/29/15     FL2 transmitted to all facilities within larger geographic area on 01/29/15     Patient informed that his/her managed care company has contracts with or will negotiate with certain facilities, including the following:   (list provided- )     Yes   Patient/family informed of bed offers received.  Patient chooses bed at [facility]     Physician recommends and patient chooses bed at [facility]    Patient to be transferred to [facility] on 01/30/15.  Patient to be transferred to facility by EMS     Patient family notified on 01/30/15 of transfer.  Name of family member notified:  Gary/son Karen/dtr/HCPOA      PHYSICIAN Please sign FL2, Please prepare priority discharge summary, including medications     Additional Comment:    _______________________________________________ Amador Cunas, LCSW 01/30/2015, 11:23 AM

## 2015-01-30 NOTE — Progress Notes (Signed)
Spoke with NP for Dr. Oliva Bustard who provided paper Rx for pt's d/c to SNF today. Awaiting d/c summary from MD in order for pt to d/c today. NP aware.  [name], MSW, LCSW [phone] (1C coverage)

## 2015-01-30 NOTE — Progress Notes (Signed)
   01/30/15 1500  Clinical Encounter Type  Visited With Patient;Family  Visit Type Spiritual support  Referral From Nurse  Consult/Referral To Chaplain  Spiritual Encounters  Spiritual Needs Emotional  Stress Factors  Patient Stress Factors Health changes  Family Stress Factors Major life changes  Advance Directives (For Healthcare)  Does patient have an advance directive? No   Chaplain visited patient and son. Son had to leave to speak with skilled nursing facility and patient was on phone with daughter. Patient asked me to return later.   AD. [phone]

## 2015-01-30 NOTE — Consult Note (Signed)
Palliative Medicine Inpatient Consult Follow Up Note   Name: Jessica Avery Date: 01/30/2015 MRN: [id number]  DOB: March 04, 1936  Referring Physician: Forest Gleason, MD  Palliative Care consult requested for this 79 y.o. female for goals of medical therapy in patient with Palliative Care consult requested for this 79 y.o. female for goals of medical therapy in patient with stage IV ovarian cancer with peritoneal carcinomatosis, MDD, DM, admitted with abd pain. Hospital course complicated by delirium.   Pt is lying in bed sleeping. Awakens briefly to tactile stimuli. Received haldol last PM. Family not present.     REVIEW OF SYSTEMS:  Patient is not able to provide ROS  CODE STATUS: Full code   PAST MEDICAL HISTORY: Past Medical History  Diagnosis Date  . Peritoneal carcinomatosis 01/15/2015  . Ovarian cancer   . Diabetes mellitus without complication   . Anemia   . GERD (gastroesophageal reflux disease)   . Irregular cardiac rhythm   . Chronic back pain   . Depression     PAST SURGICAL HISTORY:  Past Surgical History  Procedure Laterality Date  . Abdominal hysterectomy    . Appendectomy    . Cholecystectomy    . Heel spur surgery    . Tubal ligation      Vital Signs: BP 152/70 mmHg  Pulse 95  Temp(Src) 99.4 F (37.4 C) (Oral)  Resp 20  SpO2 97% There were no vitals filed for this visit.  Estimated body mass index is 21.39 kg/(m^2) as calculated from the following:   Height as of 01/14/15: 5\' 5"  (1.651 m).   Weight as of 01/16/15: 58.299 kg (128 lb 8.4 oz).  PHYSICAL EXAM: General: sleeping, NAD HEENT: OP clear Neck: Trachea midline  Cardiovascular: RRR Pulmonary/Chest: Clear ant fields Abdominal: Soft, NTTP. Hypoactive bowel sounds GU: No SP tenderness Extremities: No edema  Neurological: opens eyes briefly to tactile stimuli, does not follow commands Skin: no ulcers Psychiatric: unable to assess   LABS: CBC:    Component Value Date/Time   WBC 4.2  01/25/2015 1619   WBC 3.8 01/24/2015 0440   HGB 7.7* 01/25/2015 1619   HGB 7.7* 01/24/2015 0440   HCT 22.8* 01/25/2015 1619   HCT 22.3* 01/24/2015 0440   PLT 105* 01/25/2015 1619   PLT 118* 01/24/2015 0440   MCV 104.9* 01/25/2015 1619   MCV 104* 01/24/2015 0440   NEUTROABS 2.7 01/25/2015 1619   NEUTROABS 1.9 01/24/2015 0440   LYMPHSABS 0.8* 01/25/2015 1619   LYMPHSABS 1.3 01/24/2015 0440   MONOABS 0.6 01/25/2015 1619   MONOABS 0.5 01/24/2015 0440   EOSABS 0.1 01/25/2015 1619   EOSABS 0.1 01/24/2015 0440   BASOSABS 0.0 01/25/2015 1619   BASOSABS 0.0 01/24/2015 0440   Comprehensive Metabolic Panel:    Component Value Date/Time   NA 140 01/24/2015 0440   K 4.9 01/25/2015 1619   K 4.1 01/24/2015 0440   CL 111 01/24/2015 0440   CO2 24 01/24/2015 0440   BUN 12 01/24/2015 0440   CREATININE 1.17* 01/24/2015 0440   CREATININE 1.35 01/14/2015   GLUCOSE 73 01/24/2015 0440   CALCIUM 8.5* 01/24/2015 0440   AST 23 01/23/2015 1213   ALT 16 01/23/2015 1213   ALKPHOS 46 01/23/2015 1213   PROT 5.6* 01/23/2015 1213   ALBUMIN 3.2* 01/23/2015 1213    IMPRESSION:  Palliative Care consult requested for this 79 y.o. female for goals of medical therapy in patient with Palliative Care consult requested for this 79 y.o. female for goals of  medical therapy in patient with stage IV ovarian cancer with peritoneal carcinomatosis, MDD, DM, admitted with abd pain. Hospital course complicated by delirium.  Pt received haldol last PM for agitation. This AM, she is sleeping, in no distress, likely due to haldol.   Pt has advance directive that state she does not want aggressive care if care is futile. Will try to meet with family including HCPOA to clarify code status.     PLAN: Family meeting  More than 50% of the visit was spent in counseling/coordination of care: YES  Time spent: 35 minutes

## 2015-01-30 NOTE — Progress Notes (Signed)
[place] @ [facility] [phone]  Fax:([phone]) [place]: 1936-01-12  MR#: [id number]  KDT#:[id number]  No care team member to display  CHIEF COMPLAINT: No chief complaint on file.  Patient was admitted in the hospital with delirium and confusion abdominal pain.    Peritoneal carcinomatosis   01/15/2015 Initial Diagnosis Peritoneal carcinomatosis    Oncology Flowsheet 01/27/2015 01/28/2015 01/28/2015 01/28/2015 01/29/2015 01/29/2015 01/29/2015  ALPRAZolam (XANAX) PO   1 mg 1 mg 1 mg 1 mg 1 mg 1 mg  LORazepam (ATIVAN) IV 2 mg - - - - - -  OLANZapine (ZYPREXA) PO - - - - - - -    INTERVAL HISTORY:  Patient is currently sleeping. Lying in bed, sedated, with sitter at bedside. She is in NAD. She received haldol at 0200 due to restlessness and agitation. She is awaiting placement for SNF.   REVIEW OF SYSTEMS:   Review of Systems  Unable to perform ROS   PAST MEDICAL HISTORY: Past Medical History  Diagnosis Date  . Peritoneal carcinomatosis 01/15/2015  . Ovarian cancer   . Diabetes mellitus without complication   . Anemia   . GERD (gastroesophageal reflux disease)   . Irregular cardiac rhythm   . Chronic back pain   . Depression     PAST SURGICAL HISTORY: Past Surgical History  Procedure Laterality Date  . Abdominal hysterectomy    . Appendectomy    . Cholecystectomy    . Heel spur surgery    . Tubal ligation      FAMILY HISTORY History reviewed. No pertinent family history.  GYNECOLOGIC HISTORY:  No LMP recorded.     ADVANCED DIRECTIVES:    HEALTH MAINTENANCE: History  Substance Use Topics  . Smoking status: Former Research scientist (life sciences)  . Smokeless tobacco: Not on file  . Alcohol Use: Not on file     Colonoscopy:  PAP:  Bone density:  Lipid panel:  Allergies  Allergen Reactions  . Macrodantin [Nitrofurantoin] Hives and Itching  . Morphine And Related Other (See Comments)    Ineffective for pain   . Tequin [Gatifloxacin] Other (See Comments)     Unknown reaction    Current Facility-Administered Medications  Medication Dose Route Frequency Provider Last Rate Last Dose  . 0.9 % NaCl with KCl 20 mEq/ L  infusion   Intravenous Continuous Doctor Chlconversion, MD 60 mL/hr at 01/29/15 2134    . ALPRAZolam Duanne Moron) tablet 1 mg  1 mg Oral 3 times per day Doctor Chlconversion, MD   1 mg at 01/29/15 2133  . amLODipine (NORVASC) tablet 10 mg  10 mg Oral Daily Forest Gleason, MD   10 mg at 01/29/15 0916  . desvenlafaxine (PRISTIQ) 24 hr tablet 100 mg  100 mg Oral Daily Leia Alf, MD   50 mg at 01/28/15 1933  . feeding supplement (ENSURE ENLIVE) (ENSURE ENLIVE) liquid 237 mL  237 mL Oral BID BM Forest Gleason, MD   237 mL at 01/29/15 1749  . fentaNYL (DURAGESIC - dosed mcg/hr) 50 mcg  50 mcg Transdermal Q72H Doctor Chlconversion, MD   50 mcg at 01/29/15 1142  . haloperidol lactate (HALDOL) injection 2 mg  2 mg Intravenous Q2H PRN Gonzella Lex, MD   2 mg at 01/30/15 0210  . hydrochlorothiazide (HYDRODIURIL) tablet 25 mg  25 mg Oral Daily Doctor Chlconversion, MD   25 mg at 01/29/15 0916  . HYDROcodone-acetaminophen (NORCO) 10-325 MG per tablet 1-2 tablet  1-2 tablet Oral Q4H PRN  [place] Jack Phifer, MD   2 tablet at 01/29/15 0747  . lactulose (CHRONULAC) 10 GM/15ML solution 20 g  20 g Oral Q12H Dallas Schimke, MD   20 g at 01/29/15 2134  . lisinopril (PRINIVIL,ZESTRIL) tablet 40 mg  40 mg Oral Daily Doctor Chlconversion, MD   40 mg at 01/29/15 0916  . ondansetron (ZOFRAN) injection 4 mg  4 mg Intravenous Q4H PRN Doctor Chlconversion, MD      . pantoprazole (PROTONIX) EC tablet 40 mg  40 mg Oral Q0600 Doctor Chlconversion, MD   40 mg at 01/29/15 1950  . polyethylene glycol (MIRALAX / GLYCOLAX) packet 17 g  17 g Oral Daily PRN Doctor Chlconversion, MD   17 g at 01/25/15 0853  . QUEtiapine (SEROQUEL) tablet 50 mg  50 mg Oral QHS Gonzella Lex, MD   50 mg at 01/29/15 2133  . senna-docusate (Senokot-S) tablet 1 tablet  1 tablet Oral Q12H Doctor  Chlconversion, MD   1 tablet at 01/29/15 2133  . sodium chloride 0.9 % injection 10-40 mL  10-40 mL Intracatheter Q12H Forest Gleason, MD   10 mL at 01/28/15 2200  . sodium chloride 0.9 % injection 10-40 mL  10-40 mL Intracatheter PRN Forest Gleason, MD      . ziprasidone (GEODON) injection 10 mg  10 mg Intramuscular Once Gonzella Lex, MD   10 mg at 01/29/15 9326    OBJECTIVE: Filed Vitals:   01/29/15 2157  BP: 152/70  Pulse: 95  Temp: 99.4 F (37.4 C)  Resp:      There is no weight on file to calculate BMI.    ECOG FS0:2 - Symptomatic, <50% confined to bed  Physical Exam  Constitutional: She is oriented to person, place, and time and well-developed, well-nourished, and in no distress. No distress.  HENT:  Head: Normocephalic.  Neck: No JVD present. No tracheal deviation present. No thyromegaly present.  Cardiovascular: Normal rate and regular rhythm.  Exam reveals no friction rub.   No murmur heard. Pulmonary/Chest: Effort normal and breath sounds normal. No stridor. No respiratory distress. She has no wheezes. She has no rales. She exhibits no tenderness.  Abdominal: Soft. She exhibits no distension and no mass. There is no tenderness. There is no rebound.  Musculoskeletal: She exhibits no edema or tenderness.  Neurological: She is alert and oriented to person, place, and time. She has normal reflexes. Gait normal. GCS score is 15.  Patient was somewhat delirious   and hallucinating  Skin: Skin is warm and dry. No rash noted. She is not diaphoretic.  Psychiatric: Her mood appears anxious.  Lethargic, sedated Continues with intermittent delerium     LAB RESULTS:     Component Value Date/Time   NA 140 01/24/2015 0440   K 4.9 01/25/2015 1619   K 4.1 01/24/2015 0440   CL 111 01/24/2015 0440   CO2 24 01/24/2015 0440   GLUCOSE 73 01/24/2015 0440   BUN 12 01/24/2015 0440   CREATININE 1.17* 01/24/2015 0440   CREATININE 1.35 01/14/2015   CALCIUM 8.5* 01/24/2015 0440   PROT  5.6* 01/23/2015 1213   ALBUMIN 3.2* 01/23/2015 1213   AST 23 01/23/2015 1213   ALT 16 01/23/2015 1213   ALKPHOS 46 01/23/2015 1213   GFRNONAA 45* 01/24/2015 0440   GFRAA 52* 01/24/2015 0440    No results found for: SPEP, UPEP  Lab Results  Component Value Date   WBC 4.2 01/25/2015   NEUTROABS 2.7 01/25/2015   HGB 7.7*  01/25/2015   HCT 22.8* 01/25/2015   MCV 104.9* 01/25/2015   PLT 105* 01/25/2015    @LASTCHEMISTRY @  No results found for: LABCA2  No components found for: QBHAL937  No results for input(s): INR in the last 168 hours.  No results found for: COLORURINE, APPEARANCEUR, LABSPEC, PHURINE, GLUCOSEU, HGBUR, BILIRUBINUR, KETONESUR, PROTEINUR, UROBILINOGEN, NITRITE, LEUKOCYTESUR  STUDIES: Dg Chest 2 View  01/17/2015   CLINICAL DATA:  cough, fever  EXAM: CHEST  2 VIEW  COMPARISON:  PET-CT 04/2014.  FINDINGS: The cardiopericardial silhouette is within normal limits. Tortuous thoracic aorta. Unchanged RIGHT IJ Port-A-Cath with the tip at the cavoatrial junction. Buttons project over the chest. Monitoring leads project over the chest. No airspace disease. No pleural effusion. Hiatal hernia is present with fluid level in the intrathoracic stomach on the lateral view.  IMPRESSION: No active cardiopulmonary disease.   Electronically Signed   By: Dereck Ligas M.D.   On: 01/17/2015 15:58   Mr Jeri Cos TK Contrast  01/27/2015   CLINICAL DATA:  Confusion and declining mental status. History of ovarian cancer.  EXAM: MRI HEAD WITHOUT AND WITH CONTRAST  TECHNIQUE: Multiplanar, multiecho pulse sequences of the brain and surrounding structures were obtained without and with intravenous contrast.  CONTRAST:  86mL MULTIHANCE GADOBENATE DIMEGLUMINE 529 MG/ML IV SOLN  COMPARISON:  03/22/2012  FINDINGS: Calvarium and upper cervical spine: No marrow signal abnormality.  Orbits: Left cataract resection.  Sinuses: Right mastoid opacification with fluid and probable mucosal thickening. Clear  nasopharynx. Negative paranasal sinuses.  Brain: No acute abnormality such as infarct, hemorrhage, hydrocephalus, or mass lesion. No evidence of large vessel occlusion. T2 and FLAIR hyperintensity around the lateral ventricles consistent with mild for age small-vessel disease. No abnormal intracranial enhancement. Normal cerebral volume. No abnormal cortical diffusion.  IMPRESSION: 1. No acute intracranial finding. 2. Mild for age chronic small vessel disease. 3. Right mastoiditis.   Electronically Signed   By: Monte Fantasia M.D.   On: 01/27/2015 09:59   Ct Abdomen Pelvis W Contrast  01/17/2015   CLINICAL DATA:  Ovarian cancer. On chemotherapy. Generalized weakness and abdominal pain. UTI. Nausea vomiting diarrhea.  EXAM: CT ABDOMEN AND PELVIS WITH CONTRAST  TECHNIQUE: Multidetector CT imaging of the abdomen and pelvis was performed using the standard protocol following bolus administration of intravenous contrast.  CONTRAST:  75 mL Omnipaque 300 IV  COMPARISON:  Head 03/11/2014, CT abdomen pelvis 07/30/2013  FINDINGS: Lung bases are clear.  Large hiatal hernia.  Prior cholecystectomy. Common bile duct is dilated measuring 13.7 mm. This has progressed from the prior study. Common bile duct however is not significantly dilated at 6 mm. Correlate with LFT. No liver lesion. Pancreas and spleen are negative.  Extra renal pelvis on the right. No hydronephrosis. No renal mass. Small left renal cysts. Urinary bladder normal.  Large and small bowel diffusely dilated with air-fluid levels. Fluid-filled colon and rectum. Findings suggest ileus or gastroenteritis.  Right adnexal cystic mass measures 38 x 30 mm. This is predominantly cystic but has a solid component and is likely due to tumor. This is unchanged from the prior CT in 2014. Small omental soft tissue density deep to the umbilicus measures 1 cm and appears improved from the prior PET-CT. This showed hypermetabolic uptake on the PET-CT.  No free fluid.  No  adenopathy.  Mild atherosclerotic aorta without aneurysm.  Scoliosis and lumbar degenerative change.  No acute bony abnormality  IMPRESSION: Findings compatible with ileus/ gastroenteritis. Large and small bowel dilated with  air-fluid levels. Diffuse fluid-filled colon.  Common hepatic duct dilated but common bile duct nondilated. This may be related to cholecystectomy. Correlate with LFT.  Right adnexal cystic mass 38 x 30 mm compatible with stable tumor unchanged from prior studies. Omental nodule is slightly improved from the prior PET-CT.   Electronically Signed   By: Franchot Gallo M.D.   On: 01/17/2015 19:03   Dg Abdomen 3-way (incl Pa Cxr) ([facility] Hx)  01/24/2015   CLINICAL DATA:  Ileus with abdominal pain  EXAM: ABDOMEN SERIES  COMPARISON:  01/23/2015; 01/22/2015; CT abdomen pelvis - 01/17/2015  FINDINGS: Grossly unchanged cardiac silhouette and mediastinal contours. Stable position of support apparatus. The lungs remain hyperexpanded with flattening of the bilateral hemidiaphragms. Unchanged trace bilateral effusions with associated bibasilar opacities. No new focal airspace opacities. No evidence of edema. No pneumothorax.  Moderate to large colonic stool burden without evidence of enteric obstruction. No pneumoperitoneum, pneumatosis or portal venous gas. Small hiatal hernia is suspected.  Mild to moderate scoliotic curvature of the thoracolumbar spine.  IMPRESSION: 1. Similar findings of lung hyperexpansion and chronic trace bilateral effusions without definite superimposed acute cardiopulmonary disease. 2. Moderate to large colonic stool burden without evidence of enteric obstruction.   Electronically Signed   By: Sandi Mariscal M.D.   On: 01/24/2015 08:19   Dg Abdomen 3-way (incl Pa Cxr) ([facility] Hx)  01/23/2015   CLINICAL DATA:  Abdominal pain.  History of cancer.  EXAM: ABDOMEN SERIES  COMPARISON:  01/22/2015  FINDINGS: No cardiomegaly. Stable aortic and hilar contours. A small hiatal hernia is again  noted.  Right IJ porta catheter, tip at the upper cavoatrial junction.  Chronic hyperinflation. There is no edema, consolidation, effusion, or pneumothorax.  Unchanged gas dilated small bowel. There is a paucity of colonic distention, but based on admission CT this is likely a residual ileus rather than partial obstruction. Previously administered oral contrast is still seen at the level the rectum; none was retained in the small bowel. No pneumoperitoneum or evidence of pneumatosis.  IMPRESSION: 1. Unchanged gaseous dilation of small bowel, likely ileus given admission CT 01/17/2015. 2. Small hiatal hernia.   Electronically Signed   By: Monte Fantasia M.D.   On: 01/23/2015 13:27   Dg Abdomen 3-way (incl Pa Cxr) ([facility] Hx)  01/23/2015   CLINICAL DATA:  Acute lower abdominal pain.  EXAM: ABDOMEN SERIES  COMPARISON:  CT scan of January 17, 2015.  FINDINGS: Mildly dilated small bowel loops are noted which may simply represent ileus. Residual contrast is seen in the rectum. No colonic dilatation is noted. Stool is seen in descending colon. Phlebolith is seen in the pelvis. No free air is noted. Moderate hiatal hernia is noted. Minimal right basilar subsegmental atelectasis is noted. Right-sided Port-A-Cath is noted with distal tip in expected position of cavoatrial junction.  IMPRESSION: Mildly dilated small bowel loops are noted which may simply represent ileus, but followup radiographs are recommended to rule out obstruction. Minimal right basilar subsegmental atelectasis is noted as well.   Electronically Signed   By: Marijo Conception, M.D.   On: 01/23/2015 08:19    ASSESSMENT:  Delirium, Abdominal Pain, Weakness, Ovarian Ca.  PLAN:  Currently, patient is lethargic after receiving sedation due to agitation and hallucinations last night.  She is easily arousable. Awaiting bed at SNF.     Ovarian cancer   Staging form: Ovary, AJCC 7th Edition     Clinical stage from 05/16/2013: Stage IIIC (yT3c, N1, M0) -  Signed by Evlyn Kanner, NP on 01/15/2015   Evlyn Kanner, NP   01/30/2015 10:06 AM

## 2015-01-30 NOTE — Plan of Care (Signed)
Problem: Discharge Progression Outcomes Goal: Discharge plan in place and appropriate Individualization of care   Address patient as Jessica Avery. Patient received chemotherapy 01/14/15. Patient is a high falls risk, offer toileting every hour. Medical history includes GERD, depression, controlled with home medication and ovarian cancer.  Goal: Other Discharge Outcomes/Goals Plan of care progress to goal:  No complaints of abdominal pain. Patient continues to be confused. Haldol given once for agitation, dose was effective. Assistance provided to [facility]. Sitter for safety remains at bedside. Room near nurses station, bed alarm on.

## 2015-01-31 NOTE — Plan of Care (Signed)
Problem: Discharge Progression Outcomes Goal: Other Discharge Outcomes/Goals Outcome: Not Progressing Plan of care progress to goal:  Pain: no  Complaints of pain this shift  hemodynamically stable : labs stable continues on IVF with potassium, for lab recheck this morning  Diet :ate all of supper and bites of snack  Appropriate activity: pt still requires sitter 1:1 safety, confusion remains

## 2015-01-31 NOTE — Plan of Care (Signed)
Problem: Discharge Progression Outcomes Goal: Other Discharge Outcomes/Goals Outcome: Progressing Pt is alert and confused, pt is pleasant and cooperative, family at bedside, continues to receive IV fluids, fair appetite, denies need for pain medication, sitter at bedside, pt cannot d/c to [facility] until she has went 24 hours without use of sitter. No recent lab work performed.

## 2015-02-01 NOTE — Progress Notes (Signed)
[facility] Hematology/Oncology Progress Note  Date of admission: 01/23/2015  Hospital day:  02/01/2015  Chief Complaint: Jessica Avery is a 79 y.o. female with peritoneal carcinomatosis who was admitted with delirium, confusion, and abdominal pain.   Subjective: The patient states that she is doing fine and wants to go home.  She does not feel that she needs physical therapy and a skilled nursing facility at discharge.  She is confused.  Social History: The patient is accompanied by her son and his dog.  Allergies:  Allergies  Allergen Reactions  . Macrodantin [Nitrofurantoin] Hives and Itching  . Morphine And Related Other (See Comments)    Ineffective for pain   . Tequin [Gatifloxacin] Other (See Comments)    Unknown reaction   Scheduled medications: . ALPRAZolam  1 mg Oral 3 times per day  . amLODipine  10 mg Oral Daily  . desvenlafaxine  100 mg Oral Daily  . feeding supplement (ENSURE ENLIVE)  237 mL Oral BID BM  . fentaNYL  50 mcg Transdermal Q72H  . hydrochlorothiazide  25 mg Oral Daily  . lactulose  20 g Oral Q12H  . lisinopril  40 mg Oral Daily  . pantoprazole  40 mg Oral Q0600  . QUEtiapine  50 mg Oral QHS  . senna-docusate  1 tablet Oral Q12H  . sodium chloride  10-40 mL Intracatheter Q12H  . ziprasidone  10 mg Intramuscular Once   Review of Systems: GENERAL:  Feels "fine".  No fevers, sweats or weight loss. PERFORMANCE STATUS (ECOG):  3 HEENT:  No visual changes, runny nose, sore throat, mouth sores or tenderness. Lungs: No shortness of breath or cough.  No hemoptysis. Cardiac:  No chest pain, palpitations, orthopnea, or PND. GI:  Abdominal pain improved. GU:  No urgency, frequency, dysuria, or hematuria. Musculoskeletal:  No back pain.  No joint pain.  No muscle tenderness. Extremities:  No pain or swelling. Skin:  No rashes or skin changes. Neuro:  No headache, numbness or weakness, balance or coordination issues. Endocrine:  No  diabetes, thyroid issues, hot flashes or night sweats. Psych:  Feels that thinking is "fine". Pain:  No focal pain. Review of systems:  All other systems reviewed and found to be negative.  Physical Exam: Blood pressure 155/79, pulse 93, temperature 98 F (36.7 C), temperature source Oral, resp. rate 20, SpO2 98 %.  GENERAL: Thin elderly woman sitting up in bed in no acute distress. MENTAL STATUS:  Alert and oriented to person, place (hospital) and time (month only). HEAD:  Graying thin hair.  Normocephalic, atraumatic, face symmetric, no Cushingoid features. EYES:  Pupils equal round and reactive to light and accomodation.  No conjunctivitis or scleral icterus. ENT:  Oropharynx clear without lesion.  Tongue normal. Mucous membranes moist.  RESPIRATORY:  Clear to auscultation without rales, wheezes or rhonchi. CARDIOVASCULAR:  Regular rate and rhythm without murmur, rub or gallop. ABDOMEN:  Soft, non-tender, with active bowel sounds, and no hepatosplenomegaly.  No masses. SKIN:  No rashes, ulcers or lesions. EXTREMITIES: No edema, no skin discoloration or tenderness.  No palpable cords. LYMPH NODES: No palpable cervical, supraclavicular, axillary or inguinal adenopathy  NEUROLOGICAL: Remembers 3 of 3 objects immediately and 0 of 3 objects in 5 minutes (even with prompting).  Unable to make change or remember how many nickels in a quarter.  Knows her son.  Knows the President's name.  Moves all 4 extremities.  PSYCH:  Slightly agitated about being asked questions.  Assessment:  Jessica Avery is a 79 y.o. female with peritoneal carcinomatosis admitted with confusion and abdominal pain.  Abdominal pain resolved.  Delirium improved.  Patient was not able to be placed in a skilled nursing facility as planned on 01/30/2015 without first being able to be without a sitter for 24 hours.  She did well overnight.  Plan: 1. Anticipate discharge to care facility tomorrow.  Lequita Asal, MD   02/01/2015, 1:14 PM

## 2015-02-01 NOTE — Plan of Care (Signed)
Problem: Discharge Progression Outcomes Goal: Discharge plan in place and appropriate Individualization of care   Outcome: Progressing Individualization: Patient goes by Jessica Avery Received chemotherapy at 01/14/2015 Patient is a high fall risk, toileting offered Medication hx includes GERD, depression, controlled with home medications. Hx of ovarian cancer    Goal: Other Discharge Outcomes/Goals Outcome: Progressing Patient is calm, cooperative and confused. Patient ate 95% of dinner, snacks provided throughout night. C/o of discomfort in legs, repositioned. Patient has remained with no sitter since 1900. IV fluids continued, PO fluids continued.

## 2015-02-02 ENCOUNTER — Other Ambulatory Visit: Payer: Self-pay | Admitting: Oncology

## 2015-02-02 DIAGNOSIS — C569 Malignant neoplasm of unspecified ovary: Secondary | ICD-10-CM

## 2015-02-02 NOTE — Plan of Care (Signed)
Problem: Discharge Progression Outcomes Goal: Discharge plan in place and appropriate Individualization of care   Patient goes by Jessica Avery Received chemotherapy at 01/14/2015 Patient is a high fall risk, toileting offered Medication hx includes GERD, depression, controlled with home medications. Hx of ovarian cancer        Goal: Other Discharge Outcomes/Goals Plan of care progress to goals: -C/o of abd pain, improved with PRN pain med. -.IV fluids continued. -poor po intake.  -plan in place for the patient to be d/c'd to [facility].

## 2015-02-02 NOTE — Discharge Summary (Signed)
Patient was admitted on 4/29/, 2014 Patient was discharged on 01-30-2015 Final diagnosis Uncontrolled abdominal pain secondary to constipation and at peritentorial carcinomatosis Delirium and confusion secondary to multiple factors including severe previous depression and use of benzodiazepine . 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  Hospital stay: During hospital stay patient was started on intravenous pain medication.  Hospitalist consultation was obtained surgical consultation was obtained patient was treated for constipation.  Patient became extremely confused disoriented.  Psychiatric consultation suggested use of Geodon palliative care consult discuss at length about overall patient prognosis.  And discussed with the family about living will. Gradually patient's condition improved pain was getting better on present brain program.  Patient had hypertension which was controlled by adding Norvasc along with lisinopril.. Overall patient's prognosis is poor.  Physiotherapy evaluation suggested that rehabilitation physiotherapy might be useful.  So patient is now being discharged to the rehabilitation facility.   Since last Friday patient's condition has been stabilized.  Patient still has few episode of confusion needs physiotherapy.  Family is aware patient discharged to [facility] common nursing home.   Medication List    STOP taking these medications        hydrocortisone 25 MG suppository  Commonly known as:  ANUSOL-HC     INTEGRA PLUS Caps     venlafaxine XR 150 MG 24 hr capsule  Commonly known as:  EFFEXOR-XR      TAKE these medications        ALPRAZolam 1 MG  tablet  Commonly known as:  XANAX  Take 1 tablet (1 mg total) by mouth every 8 (eight) hours.     amLODipine 10 MG tablet  Commonly known as:  NORVASC  Take 1 tablet (10 mg total) by mouth daily.     desvenlafaxine 100 MG 24 hr tablet  Commonly known as:  PRISTIQ  Take 1 tablet (100 mg total) by mouth daily.     fentaNYL 50 MCG/HR  Commonly known as:  DURAGESIC - dosed mcg/hr  Place 1 patch (50 mcg total) onto the skin every 3 (three) days.     hydrochlorothiazide 25 MG tablet  Commonly known as:  HYDRODIURIL  Take 1 tablet (25 mg total) by mouth daily.     HYDROcodone-acetaminophen 10-325 MG per tablet  Commonly known as:  NORCO  Take 1-2 tablets by mouth every 4 (four) hours as needed for moderate pain.     lisinopril 40 MG tablet  Commonly known as:  PRINIVIL,ZESTRIL  Take 1 tablet (40 mg total) by mouth daily.     omeprazole 20 MG tablet  Commonly known as:  PRILOSEC OTC  Take 20 mg by mouth 2 (two) times daily.     ondansetron 4 MG tablet  Commonly known as:  ZOFRAN  Take 4 mg by mouth every 6 (six) hours as needed for nausea or vomiting.     pindolol 5 MG tablet  Commonly known as:  VISKEN  Take 5 mg by mouth daily.     polyethylene glycol packet  Commonly known as:  MIRALAX / GLYCOLAX  Take 17 g by mouth daily as needed (FOR constipation).     QUEtiapine 50 MG tablet  Commonly known as:  SEROQUEL  Take 1  tablet (50 mg total) by mouth at bedtime.     senna-docusate 8.6-50 MG per tablet  Commonly known as:  Senokot-S  Take 1 tablet by mouth every 12 (twelve) hours.

## 2015-02-02 NOTE — Clinical Social Work Placement (Signed)
   CLINICAL SOCIAL WORK PLACEMENT  NOTE  Date:  02/02/2015  Patient Details  Name: Jessica Avery MRN: [id number] Date of Birth: May 28, 1936  Clinical Social Work is seeking post-discharge placement for this patient at the [place] level of care (*CSW will initial, date and re-position this form in  chart as items are completed):  Yes   Patient/family provided with [facility] Work Department's list of facilities offering this level of care within the geographic area requested by the patient (or if unable, by the patient's family).  No   Patient/family informed of their freedom to choose among providers that offer the needed level of care, that participate in Medicare, Medicaid or managed care program needed by the patient, have an available bed and are willing to accept the patient.  Yes   Patient/family informed of [facility]'s ownership interest in [facility] and [facility], as well as of the fact that they are under no obligation to receive care at these facilities.  PASRR submitted to EDS on 01/29/15     PASRR number received on 01/29/15     Existing PASRR number confirmed on 01/29/15     FL2 transmitted to all facilities in geographic area requested by pt/family on 01/29/15     FL2 transmitted to all facilities within larger geographic area on 01/29/15     Patient informed that his/her managed care company has contracts with or will negotiate with certain facilities, including the following:   (list provided- )     Yes   Patient/family informed of bed offers received.  Patient chooses bed at [facility]     Physician recommends and patient chooses bed at [facility]    Patient to be transferred to [facility] on 02/02/15.  Patient to be transferred to facility by EMS     Patient family notified on 02/02/15 of transfer.  Name of family member notified:  Gary/son Karen/dtr/HCPOA      PHYSICIAN Please sign FL2, Please prepare priority discharge summary, including medications     Additional Comment:    _______________________________________________ Drema Balzarine D, LCSW 02/02/2015, 11:09 AM

## 2015-02-02 NOTE — Progress Notes (Signed)
CSW was notified that Pt has been medically cleared for dc to SNF [facility]. Pt has been without sitter for the weekend as verified by nursing.  CSW updated dc packet and faxed dc summary to SNF. RN is calling report and [name] son will provide transport per his request.  No further CSW needs at this time.   Toma Copier, [place] [address]

## 2015-02-03 ENCOUNTER — Other Ambulatory Visit: Payer: Self-pay | Admitting: Family Medicine

## 2015-02-04 ENCOUNTER — Telehealth: Payer: Self-pay | Admitting: *Deleted

## 2015-02-04 NOTE — Telephone Encounter (Signed)
In [place] since discharge and is completely confused, unable to tell reality from dreams she does not know where she is. Asking if it could be the meds. I explained that she being out of her usual environment could be affecting her we discussed that she is also on new meds. She discussed that she would like to stop chemo treatments, but is getting resistence from her siblings

## 2015-02-11 ENCOUNTER — Telehealth: Payer: Self-pay | Admitting: *Deleted

## 2015-02-11 NOTE — Telephone Encounter (Signed)
Able to get in touch with daughter, Beverlee Nims, and informed her that next appt will be scheduled once patient discharged home from nursing facility. Beverlee Nims verbalized understanding and stated will let me know once pt is at home.

## 2015-02-11 NOTE — Telephone Encounter (Signed)
-----   Message from [place] sent at 02/10/2015  2:23 PM EDT ----- Daughter [name] wants to know when next appt is for pt. You can let Beverlee Nims know if needs to be seen or not.

## 2015-02-11 NOTE — Telephone Encounter (Signed)
Attempted to call Beverlee Nims to inform her that her mother can be scheduled for an appt once she is discharged from nursing facility per Dr. Oliva Bustard. Awaiting callback to determine if patient is stable to be scheduled for next follow up appt.

## 2015-02-26 ENCOUNTER — Other Ambulatory Visit: Payer: Self-pay | Admitting: *Deleted

## 2015-02-26 ENCOUNTER — Inpatient Hospital Stay: Payer: Medicare Other | Attending: Oncology

## 2015-02-26 ENCOUNTER — Encounter (INDEPENDENT_AMBULATORY_CARE_PROVIDER_SITE_OTHER): Payer: Self-pay

## 2015-02-26 ENCOUNTER — Telehealth: Payer: Self-pay | Admitting: *Deleted

## 2015-02-26 DIAGNOSIS — C569 Malignant neoplasm of unspecified ovary: Secondary | ICD-10-CM

## 2015-02-26 DIAGNOSIS — K219 Gastro-esophageal reflux disease without esophagitis: Secondary | ICD-10-CM | POA: Insufficient documentation

## 2015-02-26 DIAGNOSIS — G893 Neoplasm related pain (acute) (chronic): Secondary | ICD-10-CM | POA: Insufficient documentation

## 2015-02-26 DIAGNOSIS — R109 Unspecified abdominal pain: Secondary | ICD-10-CM | POA: Diagnosis not present

## 2015-02-26 DIAGNOSIS — R630 Anorexia: Secondary | ICD-10-CM | POA: Insufficient documentation

## 2015-02-26 DIAGNOSIS — C786 Secondary malignant neoplasm of retroperitoneum and peritoneum: Secondary | ICD-10-CM | POA: Insufficient documentation

## 2015-02-26 DIAGNOSIS — M549 Dorsalgia, unspecified: Secondary | ICD-10-CM | POA: Insufficient documentation

## 2015-02-26 DIAGNOSIS — G8929 Other chronic pain: Secondary | ICD-10-CM | POA: Diagnosis not present

## 2015-02-26 DIAGNOSIS — R634 Abnormal weight loss: Secondary | ICD-10-CM | POA: Insufficient documentation

## 2015-02-26 DIAGNOSIS — Z87891 Personal history of nicotine dependence: Secondary | ICD-10-CM | POA: Diagnosis not present

## 2015-02-26 DIAGNOSIS — Z79899 Other long term (current) drug therapy: Secondary | ICD-10-CM | POA: Insufficient documentation

## 2015-02-26 DIAGNOSIS — Z9221 Personal history of antineoplastic chemotherapy: Secondary | ICD-10-CM | POA: Insufficient documentation

## 2015-02-26 DIAGNOSIS — E119 Type 2 diabetes mellitus without complications: Secondary | ICD-10-CM | POA: Insufficient documentation

## 2015-02-26 LAB — CBC WITH DIFFERENTIAL/PLATELET
BASOS PCT: 1 %
Basophils Absolute: 0 10*3/uL (ref 0–0.1)
Eosinophils Absolute: 0.2 10*3/uL (ref 0–0.7)
Eosinophils Relative: 4 %
HCT: 25.4 % — ABNORMAL LOW (ref 35.0–47.0)
HEMOGLOBIN: 8.3 g/dL — AB (ref 12.0–16.0)
Lymphocytes Relative: 20 %
Lymphs Abs: 1.1 10*3/uL (ref 1.0–3.6)
MCH: 34.7 pg — ABNORMAL HIGH (ref 26.0–34.0)
MCHC: 32.7 g/dL (ref 32.0–36.0)
MCV: 106 fL — AB (ref 80.0–100.0)
MONO ABS: 0.4 10*3/uL (ref 0.2–0.9)
Monocytes Relative: 8 %
Neutro Abs: 3.6 10*3/uL (ref 1.4–6.5)
Neutrophils Relative %: 67 %
Platelets: 221 10*3/uL (ref 150–440)
RBC: 2.39 MIL/uL — AB (ref 3.80–5.20)
RDW: 14.4 % (ref 11.5–14.5)
WBC: 5.3 10*3/uL (ref 3.6–11.0)

## 2015-02-26 LAB — COMPREHENSIVE METABOLIC PANEL
ALBUMIN: 3.6 g/dL (ref 3.5–5.0)
ALK PHOS: 72 U/L (ref 38–126)
ALT: 10 U/L — ABNORMAL LOW (ref 14–54)
AST: 18 U/L (ref 15–41)
Anion gap: 6 (ref 5–15)
BUN: 38 mg/dL — ABNORMAL HIGH (ref 6–20)
CO2: 25 mmol/L (ref 22–32)
Calcium: 8.7 mg/dL — ABNORMAL LOW (ref 8.9–10.3)
Chloride: 105 mmol/L (ref 101–111)
Creatinine, Ser: 1.58 mg/dL — ABNORMAL HIGH (ref 0.44–1.00)
GFR calc Af Amer: 36 mL/min — ABNORMAL LOW (ref >60)
GFR, EST NON AFRICAN AMERICAN: 31 mL/min — AB (ref >60)
GLUCOSE: 110 mg/dL — AB (ref 65–99)
POTASSIUM: 3.8 mmol/L (ref 3.5–5.1)
Sodium: 136 mmol/L (ref 135–145)
TOTAL PROTEIN: 6.4 g/dL — AB (ref 6.5–8.1)
Total Bilirubin: 0.5 mg/dL (ref 0.3–1.2)

## 2015-02-26 LAB — URINALYSIS COMPLETE WITH MICROSCOPIC (ARMC ONLY)
BACTERIA UA: NONE SEEN
Bilirubin Urine: NEGATIVE
Glucose, UA: NEGATIVE mg/dL
Hgb urine dipstick: NEGATIVE
Ketones, ur: NEGATIVE mg/dL
LEUKOCYTES UA: NEGATIVE
NITRITE: NEGATIVE
PH: 5 (ref 5.0–8.0)
Protein, ur: 100 mg/dL — AB
SPECIFIC GRAVITY, URINE: 1.018 (ref 1.005–1.030)
Trans Epithel, UA: <1

## 2015-02-26 MED ORDER — PHENAZOPYRIDINE HCL 100 MG PO TABS
100.0000 mg | ORAL_TABLET | Freq: Two times a day (BID) | ORAL | Status: AC
Start: 2015-02-26 — End: ?

## 2015-02-26 NOTE — Telephone Encounter (Signed)
States is having burning with urination. Pt brought into clinic to check UA and culture. UA is negative. Pt was advised to start taking pyridium 100mg  BID x 7 days. Informed pt's son that medication will cause urine to stain dark orange in color and to not be alarmed.

## 2015-02-27 LAB — CA 125: CA 125: 91.5 U/mL — ABNORMAL HIGH (ref 0.0–38.1)

## 2015-03-02 ENCOUNTER — Telehealth: Payer: Self-pay | Admitting: *Deleted

## 2015-03-02 MED ORDER — HYDROCODONE-ACETAMINOPHEN 10-325 MG PO TABS: 1.0000 | ORAL_TABLET | ORAL | Status: DC | PRN

## 2015-03-02 MED ORDER — HYDROCORTISONE ACETATE 25 MG RE SUPP: 25.0000 mg | Freq: Two times a day (BID) | RECTAL | Status: AC

## 2015-03-02 MED ORDER — FENTANYL 50 MCG/HR TD PT72: 50.0000 ug | MEDICATED_PATCH | TRANSDERMAL | Status: DC

## 2015-03-02 NOTE — Telephone Encounter (Signed)
Informed that prescription is ready to pick up  

## 2015-03-03 ENCOUNTER — Telehealth: Payer: Self-pay | Admitting: *Deleted

## 2015-03-03 MED ORDER — FENTANYL 25 MCG/HR TD PT72: 25.0000 ug | MEDICATED_PATCH | TRANSDERMAL | Status: DC

## 2015-03-03 MED ORDER — OXYCODONE HCL 5 MG PO TABS: 5.0000 mg | ORAL_TABLET | ORAL | Status: DC | PRN

## 2015-03-03 NOTE — Telephone Encounter (Signed)
Informed that prescription is ready to pick up. I spoke with Jessica Avery and told her to stop the hydrocodone and use the Oxycodone 1 every 4 hours and no more than that. Also that we were increasing the fentanyl and rx given for 25 mcg until her next fill when we wuill prescribe 42mcg patch, they are to use a 16mcg and 25 mcg patch. She repeated this back tome

## 2015-03-05 ENCOUNTER — Other Ambulatory Visit: Payer: Self-pay | Admitting: *Deleted

## 2015-03-05 ENCOUNTER — Telehealth: Payer: Self-pay | Admitting: Hematology and Oncology

## 2015-03-05 ENCOUNTER — Telehealth: Payer: Self-pay | Admitting: *Deleted

## 2015-03-05 NOTE — Telephone Encounter (Signed)
[facility] RN called to inform of patient's current condition. Pt is having severe pain in abdomen, per RN bowel sounds are normal and pt is having regular bowel movements with no abdominal tenderness. Pt was given pain medication and fentanyl patch was changed. RN reported that 30mcg patch was dislodged. Verbal order by Dr. Mike Gip given to increase oxycodone 5mg  to every 2-3 hours as needed for pain. Also, verbal order given to increase Anusol HC to BID. RN verbalized understanding and stated that pt's pain level was improving.

## 2015-03-05 NOTE — Telephone Encounter (Signed)
  Re: Pain  I called today to check on the patient.  She was sleeping.  The phone call was answered by her son.  He noted that the Fentanyl patch had been changed today.  She had no nausea or vomiting.  Last bowel movement was yesterday.  Her abdominal pain had eased off.  I stated that if her symptoms returned, she was to contact the office as she would need an evaluation.  Lequita Asal, MD

## 2015-03-06 ENCOUNTER — Telehealth: Payer: Self-pay | Admitting: *Deleted

## 2015-03-06 ENCOUNTER — Telehealth: Payer: Self-pay | Admitting: Hematology and Oncology

## 2015-03-06 MED ORDER — ALPRAZOLAM 1 MG PO TABS: 1.0000 mg | ORAL_TABLET | Freq: Three times a day (TID) | ORAL | Status: DC | PRN

## 2015-03-06 NOTE — Telephone Encounter (Signed)
Called in.

## 2015-03-06 NOTE — Telephone Encounter (Signed)
Per pts daughter, pt is still in significant pain even after taking oxycodone every 2 hours. Daughter states will check on the pt within the next hour or so and see how her pain is doing. If pt continues to be in extreme pain, the daughter stated will take pt to the ED. [facility] RN has visited pt twice today as well.

## 2015-03-06 NOTE — Telephone Encounter (Signed)
  Can you call the patient and see how she is doing today?  M

## 2015-03-06 NOTE — Telephone Encounter (Signed)
  Re: pain  Today I spoke with the patient regarding her abdominal pain.  She had previously been admitted to the hospital with abdominal pain secondary to constipation and peritoneal carcinomatosis.   Yesterday, her Fentanyl pain patch had come off.  A new patch was placed.  She was told to increase her oxycodone to every 2-3 hours for pain.  Pain initially improved yesterday.  Today she has pain, poorly controlled with pills.  She is asking for IV pain medications.  I discussed referral to the ER or possible transition from home health care to hospice.  If she decided to pursue treatment in the future, she could leave hospice.  The patient was considering.  She felt coming to the hospital was "a last resort".  Lequita Asal, MD

## 2015-03-09 ENCOUNTER — Other Ambulatory Visit: Payer: Self-pay | Admitting: *Deleted

## 2015-03-09 MED ORDER — OXYCODONE HCL 5 MG PO TABS: 5.0000 mg | ORAL_TABLET | ORAL | Status: DC | PRN

## 2015-03-09 NOTE — Telephone Encounter (Signed)
Informed that prescription is ready to pick up  

## 2015-03-11 ENCOUNTER — Telehealth: Payer: Self-pay | Admitting: *Deleted

## 2015-03-11 NOTE — Telephone Encounter (Signed)
[name] son came to clinic to speak with me regarding his mother. States that wishes appointment can be moved sooner because he thinks his mom needs to be evaluated sooner than 6/23. Asked pt's son if his mom has experienced any new symptoms or if is having any increased pain. [name] son states that she does not have any new symptoms at this time but he states that he feels like she needs sooner evaluation due to feeling "lump" in pt's abdomen, pt starting to become "boney", requesting more frequent pain medication. Pt is followed by [facility] at this time. Instructed pt's son to call [facility] RN to evaluate pt since it is hard to evaluate pt's condition without assessing patient in person. Informed pt's son that if [facility] RN determines that pt needs to be seen ASAP then can move pt's appointment to Monday. [name] son verbalized understanding.

## 2015-03-11 NOTE — Telephone Encounter (Signed)
[name] son called to see if Dr. Oliva Bustard would start patient back on treatment and why he has waited long this long to set up follow up appt. Informed pt's son that treatment options will be discussed at next appt. Follow up appt was delayed in scheduling to 6/23 due to giving patient time to recover after extended hospitalization. Informed pt's son that this time was needed to build the pt's strength mentally and physically to be able to tolerate any kind of treatment for cancer. [name] son verbalized understanding and stated that pt has been doing much better and is back to her old-self again. Per pt's son, pt has been walking around house without difficulty and pain has been well managed since pain crisis a few days ago.

## 2015-03-13 LAB — URINE CULTURE

## 2015-03-16 ENCOUNTER — Other Ambulatory Visit: Payer: Self-pay | Admitting: *Deleted

## 2015-03-16 MED ORDER — ALPRAZOLAM 1 MG PO TABS: 1.0000 mg | ORAL_TABLET | Freq: Three times a day (TID) | ORAL | Status: DC | PRN

## 2015-03-16 MED ORDER — OXYCODONE HCL 5 MG PO TABS: 5.0000 mg | ORAL_TABLET | ORAL | Status: DC | PRN

## 2015-03-16 NOTE — Telephone Encounter (Signed)
Informed that prescription is ready to pick up  

## 2015-03-19 ENCOUNTER — Inpatient Hospital Stay (HOSPITAL_BASED_OUTPATIENT_CLINIC_OR_DEPARTMENT_OTHER): Payer: Medicare Other | Admitting: Oncology

## 2015-03-19 ENCOUNTER — Inpatient Hospital Stay: Payer: Medicare Other

## 2015-03-19 VITALS — BP 182/104 | HR 73 | Temp 97.7°F | Wt 115.3 lb

## 2015-03-19 DIAGNOSIS — C569 Malignant neoplasm of unspecified ovary: Secondary | ICD-10-CM

## 2015-03-19 DIAGNOSIS — Z79899 Other long term (current) drug therapy: Secondary | ICD-10-CM

## 2015-03-19 DIAGNOSIS — G893 Neoplasm related pain (acute) (chronic): Secondary | ICD-10-CM

## 2015-03-19 DIAGNOSIS — M549 Dorsalgia, unspecified: Secondary | ICD-10-CM

## 2015-03-19 DIAGNOSIS — C786 Secondary malignant neoplasm of retroperitoneum and peritoneum: Secondary | ICD-10-CM

## 2015-03-19 DIAGNOSIS — R109 Unspecified abdominal pain: Secondary | ICD-10-CM

## 2015-03-19 DIAGNOSIS — Z9221 Personal history of antineoplastic chemotherapy: Secondary | ICD-10-CM

## 2015-03-19 DIAGNOSIS — C801 Malignant (primary) neoplasm, unspecified: Principal | ICD-10-CM

## 2015-03-19 DIAGNOSIS — R630 Anorexia: Secondary | ICD-10-CM

## 2015-03-19 DIAGNOSIS — E119 Type 2 diabetes mellitus without complications: Secondary | ICD-10-CM

## 2015-03-19 DIAGNOSIS — R634 Abnormal weight loss: Secondary | ICD-10-CM

## 2015-03-19 DIAGNOSIS — K219 Gastro-esophageal reflux disease without esophagitis: Secondary | ICD-10-CM

## 2015-03-19 DIAGNOSIS — G8929 Other chronic pain: Secondary | ICD-10-CM

## 2015-03-19 LAB — CBC WITH DIFFERENTIAL/PLATELET
BASOS ABS: 0 10*3/uL (ref 0–0.1)
BASOS PCT: 1 %
Eosinophils Absolute: 0.1 10*3/uL (ref 0–0.7)
Eosinophils Relative: 2 %
HCT: 31 % — ABNORMAL LOW (ref 35.0–47.0)
HEMOGLOBIN: 10.1 g/dL — AB (ref 12.0–16.0)
Lymphocytes Relative: 13 %
Lymphs Abs: 0.7 10*3/uL — ABNORMAL LOW (ref 1.0–3.6)
MCH: 34 pg (ref 26.0–34.0)
MCHC: 32.6 g/dL (ref 32.0–36.0)
MCV: 104.3 fL — ABNORMAL HIGH (ref 80.0–100.0)
MONOS PCT: 7 %
Monocytes Absolute: 0.4 10*3/uL (ref 0.2–0.9)
NEUTROS ABS: 4.5 10*3/uL (ref 1.4–6.5)
Neutrophils Relative %: 77 %
Platelets: 213 10*3/uL (ref 150–440)
RBC: 2.97 MIL/uL — ABNORMAL LOW (ref 3.80–5.20)
RDW: 13.1 % (ref 11.5–14.5)
WBC: 5.7 10*3/uL (ref 3.6–11.0)

## 2015-03-19 LAB — COMPREHENSIVE METABOLIC PANEL
ALK PHOS: 88 U/L (ref 38–126)
ALT: 9 U/L — ABNORMAL LOW (ref 14–54)
AST: 16 U/L (ref 15–41)
Albumin: 3.4 g/dL — ABNORMAL LOW (ref 3.5–5.0)
Anion gap: 6 (ref 5–15)
BUN: 24 mg/dL — ABNORMAL HIGH (ref 6–20)
CHLORIDE: 100 mmol/L — AB (ref 101–111)
CO2: 28 mmol/L (ref 22–32)
CREATININE: 1.48 mg/dL — AB (ref 0.44–1.00)
Calcium: 8.6 mg/dL — ABNORMAL LOW (ref 8.9–10.3)
GFR calc non Af Amer: 33 mL/min — ABNORMAL LOW (ref >60)
GFR, EST AFRICAN AMERICAN: 38 mL/min — AB (ref >60)
Glucose, Bld: 104 mg/dL — ABNORMAL HIGH (ref 65–99)
Potassium: 4.7 mmol/L (ref 3.5–5.1)
Sodium: 134 mmol/L — ABNORMAL LOW (ref 135–145)
Total Bilirubin: 0.4 mg/dL (ref 0.3–1.2)
Total Protein: 6.6 g/dL (ref 6.5–8.1)

## 2015-03-19 MED ORDER — DESVENLAFAXINE SUCCINATE ER 50 MG PO TB24
50.0000 mg | ORAL_TABLET | Freq: Every day | ORAL | Status: DC
Start: 2015-03-19 — End: 2015-04-06

## 2015-03-19 MED ORDER — FENTANYL 100 MCG/HR TD PT72: 100.0000 ug | MEDICATED_PATCH | TRANSDERMAL | Status: DC

## 2015-03-19 MED ORDER — ALPRAZOLAM 1 MG PO TABS: 1.0000 mg | ORAL_TABLET | Freq: Three times a day (TID) | ORAL | Status: DC | PRN

## 2015-03-19 NOTE — Progress Notes (Signed)
Patient does have living will.  Former smoker. Requesting refill for Prostiq.  Questioning # of Zanax prescribed.  Can this be increased?

## 2015-03-23 ENCOUNTER — Other Ambulatory Visit: Payer: Self-pay | Admitting: Oncology

## 2015-03-23 ENCOUNTER — Other Ambulatory Visit: Payer: Self-pay | Admitting: Family Medicine

## 2015-03-23 ENCOUNTER — Encounter: Payer: Self-pay | Admitting: Oncology

## 2015-03-23 ENCOUNTER — Other Ambulatory Visit: Payer: Self-pay | Admitting: *Deleted

## 2015-03-23 MED ORDER — OXYCODONE HCL 5 MG PO TABS: 2.5000 mg | ORAL_TABLET | ORAL | Status: DC | PRN

## 2015-03-23 NOTE — Progress Notes (Signed)
[place] @ [facility] [phone]  Fax:([phone]) [place]: 08-12-1936  MR#: [id number]  KNL#:[id number]  Patient Care Team: Adrian Prows, MD as PCP - General (Infectious Diseases)  CHIEF COMPLAINT:  Chief Complaint  Patient presents with  . 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  Patient was started on carboplatinum and gemcitabine for progressive ovarian cancer Due to poor tolerance to chemotherapy patient is now on a Avastin (December, 2015) 10/2014      Patient started on Taxol and Avastin June, 2016 Because of multiple psychiatric issue patient has been off chemotherapy     Peritoneal carcinomatosis   01/15/2015 Initial Diagnosis Peritoneal carcinomatosis    Ovarian cancer   01/15/2015 Initial Diagnosis Ovarian cancer    Oncology Flowsheet 01/31/2015 01/31/2015 02/01/2015 02/01/2015 02/01/2015 02/02/2015 02/02/2015  ALPRAZolam (XANAX) PO 1 mg 1 mg 1 mg 1 mg 1 mg 1 mg 1 mg  LORazepam (ATIVAN) IV - - - - - - -  OLANZapine (ZYPREXA) PO - - - - - - -    INTERVAL HISTORY:  79 year old lady with locally advanced carcinoma 40 patient had been admitted in the rehabilitation and went through multiple changes in the medication.  Patient continues to have increasing abdominal pain and discomfort but less confused.  Appetite has been poor.  Has lost significant weight.  Patient is here for ongoing evaluation regarding ovarian cancer patient has been off chemotherapy for last more than 6 weeks causal significant problems associated with depression and medication REVIEW OF SYSTEMS:   Gen. status: Patient is alert oriented somewhat depressed.  In mild  pain HEENT: No headache.  No soreness in the mouth Cardiac: No chest pain or paroxysmal nocturnal dyspnea Lungs: Shortness of breath on exertion GI: Persistent nausea.  Increasing abdominal pain.  Pain is in the left lower quadrant. Lower extremity no swelling.  Neuro: Continues to have depression.  Dizziness.  No other localizing symptom Psychiatric system: Severe depression being managed by psychiatrist As per HPI. Otherwise, a complete review of systems is negatve.  PAST MEDICAL HISTORY: Past Medical History  Diagnosis Date  . Peritoneal carcinomatosis 01/15/2015  . Ovarian cancer   . Diabetes mellitus without complication   . Anemia   . GERD (gastroesophageal reflux disease)   . Irregular cardiac rhythm   . Chronic back pain   . Depression     PAST SURGICAL HISTORY: Past Surgical History  Procedure Laterality Date  . Abdominal hysterectomy    . Appendectomy    . Cholecystectomy    . Heel spur surgery    . Tubal ligation       ADVANCED DIRECTIVES:  No flowsheet data found.  HEALTH MAINTENANCE: History  Substance Use Topics  . Smoking status: Former Research scientist (life sciences)  . Smokeless tobacco: Not on file  . Alcohol Use: Not on file      Allergies  Allergen Reactions  . Macrodantin [Nitrofurantoin] Hives and Itching  .  Morphine And Related Other (See Comments)    Ineffective for pain   . Nitrofurantoin Monohyd Macro Itching and Hives  . Sulfa Antibiotics Rash  . Tequin [Gatifloxacin] Other (See Comments), Rash and Nausea And Vomiting    Patient does not know Unknown reaction    Current Outpatient Prescriptions  Medication Sig Dispense Refill  . ALPRAZolam (XANAX) 1 MG tablet Take 1 tablet (1 mg total) by mouth every 8 (eight) hours as needed for anxiety. 30 tablet 3  . FeFum-FePoly-FA-B Cmp-C-Biot (INTEGRA PLUS) CAPS Take by mouth daily.    . hydrochlorothiazide (HYDRODIURIL) 25 MG tablet Take 1 tablet (25 mg total) by mouth daily. 30 tablet 0  . hydrocortisone  (ANUSOL-HC) 25 MG suppository Place 1 suppository (25 mg total) rectally 2 (two) times daily. 12 suppository 2  . lisinopril (PRINIVIL,ZESTRIL) 40 MG tablet Take 1 tablet (40 mg total) by mouth daily. 30 tablet 0  . omeprazole (PRILOSEC OTC) 20 MG tablet Take 20 mg by mouth 2 (two) times daily.    . ondansetron (ZOFRAN) 4 MG tablet Take 4 mg by mouth every 6 (six) hours as needed for nausea or vomiting.    Marland Kitchen oxyCODONE (OXY IR/ROXICODONE) 5 MG immediate release tablet Take 1 tablet (5 mg total) by mouth every 2 (two) hours as needed for severe pain. 90 tablet 0  . pantoprazole (PROTONIX) 20 MG tablet Take 20 mg by mouth daily.    . phenazopyridine (PYRIDIUM) 100 MG tablet Take 1 tablet (100 mg total) by mouth 2 (two) times daily with a meal. 14 tablet 0  . pindolol (VISKEN) 5 MG tablet Take 5 mg by mouth daily.     . polyethylene glycol (MIRALAX / GLYCOLAX) packet Take 17 g by mouth daily as needed (FOR constipation). 14 each 0  . senna-docusate (SENOKOT-S) 8.6-50 MG per tablet Take 1 tablet by mouth every 12 (twelve) hours. 30 tablet 0  . amLODipine (NORVASC) 10 MG tablet Take 1 tablet (10 mg total) by mouth daily. (Patient not taking: Reported on 03/19/2015) 30 tablet 0  . desvenlafaxine (PRISTIQ) 50 MG 24 hr tablet Take 1 tablet (50 mg total) by mouth daily. 30 tablet 0  . fentaNYL (DURAGESIC - DOSED MCG/HR) 100 MCG/HR Place 1 patch (100 mcg total) onto the skin every 3 (three) days. 10 patch 0  . QUEtiapine (SEROQUEL) 50 MG tablet Take 1 tablet (50 mg total) by mouth at bedtime. (Patient not taking: Reported on 03/19/2015) 30 tablet 0   No current facility-administered medications for this visit.    OBJECTIVE:  Filed Vitals:   03/19/15 1625  BP: 182/104  Pulse: 73  Temp: 97.7 F (36.5 C)     Body mass index is 19.19 kg/(m^2).    ECOG FS:2 - Symptomatic, <50% confined to bed  PHYSICAL EXAM:  GENERAL:  Well developed, well nourished, sitting comfortably in the exam room in no acute  distress. MENTAL STATUS:  Alert and oriented to person, place and time. HEAD:  Normocephalic, atraumatic, face symmetric, no Cushingoid features. EYES:    Pupils equal round and reactive to light and accomodation.  No conjunctivitis or scleral icterus. ENT:  Oropharynx clear without lesion.  Tongue normal. Mucous membranes moist.  RESPIRATORY:  Clear to auscultation without rales, wheezes or rhonchi. CARDIOVASCULAR:  Regular rate and rhythm without murmur, rub or gallop. . ABDOMEN:  Tenderness in the left lower quadrant BACK:  No CVA tenderness.  No tenderness on percussion of the back or rib cage. SKIN:  No rashes,  ulcers or lesions. EXTREMITIES: No edema, no skin discoloration or tenderness.  No palpable cords. LYMPH NODES: No palpable cervical, supraclavicular, axillary or inguinal adenopathy  NEUROLOGICAL: Unremarkable. PSYCH:  Appropriate.  LAB RESULTS:  Clinical Support on 03/19/2015  Component Date Value Ref Range Status  . WBC 03/19/2015 5.7  3.6 - 11.0 K/uL Final  . RBC 03/19/2015 2.97* 3.80 - 5.20 MIL/uL Final  . Hemoglobin 03/19/2015 10.1* 12.0 - 16.0 g/dL Final  . HCT 03/19/2015 31.0* 35.0 - 47.0 % Final  . MCV 03/19/2015 104.3* 80.0 - 100.0 fL Final  . MCH 03/19/2015 34.0  26.0 - 34.0 pg Final  . MCHC 03/19/2015 32.6  32.0 - 36.0 g/dL Final  . RDW 03/19/2015 13.1  11.5 - 14.5 % Final  . Platelets 03/19/2015 213  150 - 440 K/uL Final  . Neutrophils Relative % 03/19/2015 77   Final  . Neutro Abs 03/19/2015 4.5  1.4 - 6.5 K/uL Final  . Lymphocytes Relative 03/19/2015 13   Final  . Lymphs Abs 03/19/2015 0.7* 1.0 - 3.6 K/uL Final  . Monocytes Relative 03/19/2015 7   Final  . Monocytes Absolute 03/19/2015 0.4  0.2 - 0.9 K/uL Final  . Eosinophils Relative 03/19/2015 2   Final  . Eosinophils Absolute 03/19/2015 0.1  0 - 0.7 K/uL Final  . Basophils Relative 03/19/2015 1   Final  . Basophils Absolute 03/19/2015 0.0  0 - 0.1 K/uL Final  . Sodium 03/19/2015 134* 135 - 145  mmol/L Final  . Potassium 03/19/2015 4.7  3.5 - 5.1 mmol/L Final  . Chloride 03/19/2015 100* 101 - 111 mmol/L Final  . CO2 03/19/2015 28  22 - 32 mmol/L Final  . Glucose, Bld 03/19/2015 104* 65 - 99 mg/dL Final  . BUN 03/19/2015 24* 6 - 20 mg/dL Final  . Creatinine, Ser 03/19/2015 1.48* 0.44 - 1.00 mg/dL Final  . Calcium 03/19/2015 8.6* 8.9 - 10.3 mg/dL Final  . Total Protein 03/19/2015 6.6  6.5 - 8.1 g/dL Final  . Albumin 03/19/2015 3.4* 3.5 - 5.0 g/dL Final  . AST 03/19/2015 16  15 - 41 U/L Final  . ALT 03/19/2015 9* 14 - 54 U/L Final  . Alkaline Phosphatase 03/19/2015 88  38 - 126 U/L Final  . Total Bilirubin 03/19/2015 0.4  0.3 - 1.2 mg/dL Final  . GFR calc non Af Amer 03/19/2015 33* >60 mL/min Final  . GFR calc Af Amer 03/19/2015 38* >60 mL/min Final   Comment: (NOTE) The eGFR has been calculated using the CKD EPI equation. This calculation has not been validated in all clinical situations. eGFR's persistently <60 mL/min signify possible Chronic Kidney Disease.   . Anion gap 03/19/2015 6  5 - 15 Final      STUDIES: No results found.  ASSESSMENT: Ovarian cancer is stage IV recurrent disease. depression  MEDICAL DECISION MAKING:  All lab data has been reviewed. Increasing abdominal pain so we will increase fentanyl patch 200 g I had detailed discussion with patient and family about continuing chemotherapy quality-of-life.  Patient does develop number of issues were chemotherapy started.  At present time if he can control pain with pain medication that would be the best option.  Other options of treatment has been discussed.  This has been discussed with the patient and family. Total duration of visit was 45 minutes.  50% or more time was spent in counseling patient and family regarding prognosis and options of treatment and available resources  Patient expressed understanding and was in  agreement with this plan. She also understands that She can call clinic at any time  with any questions, concerns, or complaints.    Ovarian cancer   Staging form: Ovary, AJCC 7th Edition     Clinical stage from 05/16/2013: Stage IIIC (yT3c, N1, M0) - Signed by Evlyn Kanner, NP on 01/15/2015   Forest Gleason, MD   03/23/2015 8:46 AM

## 2015-03-23 NOTE — Telephone Encounter (Signed)
Informed that prescription is ready to pick up Informed of dose change

## 2015-03-31 ENCOUNTER — Telehealth: Payer: Self-pay | Admitting: *Deleted

## 2015-03-31 ENCOUNTER — Inpatient Hospital Stay: Payer: Medicare Other | Attending: Oncology

## 2015-03-31 DIAGNOSIS — Z87891 Personal history of nicotine dependence: Secondary | ICD-10-CM | POA: Insufficient documentation

## 2015-03-31 DIAGNOSIS — C786 Secondary malignant neoplasm of retroperitoneum and peritoneum: Secondary | ICD-10-CM | POA: Diagnosis not present

## 2015-03-31 DIAGNOSIS — K219 Gastro-esophageal reflux disease without esophagitis: Secondary | ICD-10-CM | POA: Insufficient documentation

## 2015-03-31 DIAGNOSIS — F329 Major depressive disorder, single episode, unspecified: Secondary | ICD-10-CM | POA: Insufficient documentation

## 2015-03-31 DIAGNOSIS — M549 Dorsalgia, unspecified: Secondary | ICD-10-CM | POA: Diagnosis not present

## 2015-03-31 DIAGNOSIS — M25561 Pain in right knee: Secondary | ICD-10-CM | POA: Insufficient documentation

## 2015-03-31 DIAGNOSIS — N390 Urinary tract infection, site not specified: Secondary | ICD-10-CM

## 2015-03-31 DIAGNOSIS — G8929 Other chronic pain: Secondary | ICD-10-CM | POA: Diagnosis not present

## 2015-03-31 DIAGNOSIS — E119 Type 2 diabetes mellitus without complications: Secondary | ICD-10-CM | POA: Diagnosis not present

## 2015-03-31 DIAGNOSIS — R978 Other abnormal tumor markers: Secondary | ICD-10-CM | POA: Insufficient documentation

## 2015-03-31 DIAGNOSIS — M25562 Pain in left knee: Secondary | ICD-10-CM | POA: Insufficient documentation

## 2015-03-31 DIAGNOSIS — Z9181 History of falling: Secondary | ICD-10-CM | POA: Insufficient documentation

## 2015-03-31 DIAGNOSIS — C569 Malignant neoplasm of unspecified ovary: Secondary | ICD-10-CM | POA: Insufficient documentation

## 2015-03-31 DIAGNOSIS — Z79899 Other long term (current) drug therapy: Secondary | ICD-10-CM | POA: Diagnosis not present

## 2015-03-31 LAB — URINALYSIS COMPLETE WITH MICROSCOPIC (ARMC ONLY)
Bilirubin Urine: NEGATIVE
GLUCOSE, UA: NEGATIVE mg/dL
HGB URINE DIPSTICK: NEGATIVE
Ketones, ur: NEGATIVE mg/dL
Nitrite: NEGATIVE
PROTEIN: 100 mg/dL — AB
SPECIFIC GRAVITY, URINE: 1.012 (ref 1.005–1.030)
pH: 7 (ref 5.0–8.0)

## 2015-03-31 MED ORDER — OXYCODONE HCL 5 MG PO TABS: 2.5000 mg | ORAL_TABLET | ORAL | Status: DC | PRN

## 2015-03-31 NOTE — Telephone Encounter (Signed)
Will bring her in at 115 and pick up rx too for pain med

## 2015-04-01 ENCOUNTER — Other Ambulatory Visit: Payer: Self-pay | Admitting: Oncology

## 2015-04-01 ENCOUNTER — Telehealth: Payer: Self-pay | Admitting: *Deleted

## 2015-04-01 MED ORDER — CEPHALEXIN 500 MG PO CAPS: 500.0000 mg | ORAL_CAPSULE | Freq: Three times a day (TID) | ORAL | Status: AC

## 2015-04-01 NOTE — Telephone Encounter (Signed)
Informed Jessica Avery that based on UA, abx Keflex will be started she will take it tid for 7 days and that culture is still pending

## 2015-04-02 LAB — URINE CULTURE

## 2015-04-06 ENCOUNTER — Telehealth: Payer: Self-pay | Admitting: *Deleted

## 2015-04-06 DIAGNOSIS — C569 Malignant neoplasm of unspecified ovary: Secondary | ICD-10-CM

## 2015-04-06 MED ORDER — OXYCODONE HCL 5 MG PO TABS: 2.5000 mg | ORAL_TABLET | ORAL | Status: DC | PRN

## 2015-04-06 MED ORDER — DESVENLAFAXINE SUCCINATE ER 50 MG PO TB24: 50.0000 mg | ORAL_TABLET | Freq: Every day | ORAL | Status: DC

## 2015-04-06 NOTE — Telephone Encounter (Signed)
Informed that prescription is ready to pick up  

## 2015-04-06 NOTE — Addendum Note (Signed)
Addended by: Betti Cruz on: 04/06/2015 10:24 AM   Modules accepted: Orders

## 2015-04-10 ENCOUNTER — Other Ambulatory Visit: Payer: Self-pay | Admitting: *Deleted

## 2015-04-10 NOTE — Telephone Encounter (Signed)
Spoke with Jessica Avery who reports she pot on her fentanyl this morning and it was the last patch she had, but will check again when she gets home since she should have enough to last to the end of next week. I also informed her that she should have enough Oxycodone to last until Tuesday. She states that she was taking more than prescribed due to a "bad fall" last week which she refused to go to ER to be checked out. I stressed the importance of not self medicating and expecting that the provider will keep giving her med at her choice of dosage. I told her the she will have to either go to be checked or make her medication last by using Tylenol or Ibuprofen. She verbalized understanding and said they are trying and have even hidden her pain med form her but she threw an absolute fit over that so they had to give it back to her

## 2015-04-10 NOTE — Telephone Encounter (Signed)
Attempting to contact [name] to inform her that it is too early for either refill. She just got refill of Oxycodone on 7/11 and got 30 day supply of Fentanyl on 6/23. I have left message for her to return my call

## 2015-04-11 ENCOUNTER — Other Ambulatory Visit: Payer: Self-pay | Admitting: Oncology

## 2015-04-13 ENCOUNTER — Telehealth: Payer: Self-pay | Admitting: *Deleted

## 2015-04-13 MED ORDER — OXYCODONE HCL 5 MG PO TABS: 2.5000 mg | ORAL_TABLET | ORAL | Status: DC | PRN

## 2015-04-13 NOTE — Telephone Encounter (Signed)
Informed that prescription is ready to pick up  

## 2015-04-16 ENCOUNTER — Inpatient Hospital Stay: Payer: Medicare Other

## 2015-04-16 ENCOUNTER — Inpatient Hospital Stay (HOSPITAL_BASED_OUTPATIENT_CLINIC_OR_DEPARTMENT_OTHER): Payer: Medicare Other | Admitting: Oncology

## 2015-04-16 VITALS — BP 164/90 | HR 91 | Temp 97.2°F | Wt 116.4 lb

## 2015-04-16 DIAGNOSIS — M25562 Pain in left knee: Secondary | ICD-10-CM

## 2015-04-16 DIAGNOSIS — E119 Type 2 diabetes mellitus without complications: Secondary | ICD-10-CM

## 2015-04-16 DIAGNOSIS — M549 Dorsalgia, unspecified: Secondary | ICD-10-CM

## 2015-04-16 DIAGNOSIS — Z9181 History of falling: Secondary | ICD-10-CM

## 2015-04-16 DIAGNOSIS — G8929 Other chronic pain: Secondary | ICD-10-CM

## 2015-04-16 DIAGNOSIS — R978 Other abnormal tumor markers: Secondary | ICD-10-CM

## 2015-04-16 DIAGNOSIS — C786 Secondary malignant neoplasm of retroperitoneum and peritoneum: Secondary | ICD-10-CM | POA: Diagnosis not present

## 2015-04-16 DIAGNOSIS — C569 Malignant neoplasm of unspecified ovary: Secondary | ICD-10-CM

## 2015-04-16 DIAGNOSIS — F329 Major depressive disorder, single episode, unspecified: Secondary | ICD-10-CM | POA: Diagnosis not present

## 2015-04-16 DIAGNOSIS — Z79899 Other long term (current) drug therapy: Secondary | ICD-10-CM

## 2015-04-16 DIAGNOSIS — K219 Gastro-esophageal reflux disease without esophagitis: Secondary | ICD-10-CM

## 2015-04-16 DIAGNOSIS — M25561 Pain in right knee: Secondary | ICD-10-CM

## 2015-04-16 LAB — COMPREHENSIVE METABOLIC PANEL
ALT: 7 U/L — ABNORMAL LOW (ref 14–54)
AST: 17 U/L (ref 15–41)
Albumin: 3.7 g/dL (ref 3.5–5.0)
Alkaline Phosphatase: 80 U/L (ref 38–126)
Anion gap: 4 — ABNORMAL LOW (ref 5–15)
BUN: 39 mg/dL — AB (ref 6–20)
CALCIUM: 8.8 mg/dL — AB (ref 8.9–10.3)
CO2: 28 mmol/L (ref 22–32)
Chloride: 103 mmol/L (ref 101–111)
Creatinine, Ser: 1.81 mg/dL — ABNORMAL HIGH (ref 0.44–1.00)
GFR calc Af Amer: 30 mL/min — ABNORMAL LOW (ref >60)
GFR, EST NON AFRICAN AMERICAN: 26 mL/min — AB (ref >60)
Glucose, Bld: 118 mg/dL — ABNORMAL HIGH (ref 65–99)
POTASSIUM: 5 mmol/L (ref 3.5–5.1)
Sodium: 135 mmol/L (ref 135–145)
Total Bilirubin: 0.4 mg/dL (ref 0.3–1.2)
Total Protein: 6.8 g/dL (ref 6.5–8.1)

## 2015-04-16 LAB — CBC WITH DIFFERENTIAL/PLATELET
BASOS ABS: 0 10*3/uL (ref 0–0.1)
BASOS PCT: 1 %
EOS PCT: 4 %
Eosinophils Absolute: 0.1 10*3/uL (ref 0–0.7)
HEMATOCRIT: 27.7 % — AB (ref 35.0–47.0)
Hemoglobin: 9.1 g/dL — ABNORMAL LOW (ref 12.0–16.0)
Lymphocytes Relative: 23 %
Lymphs Abs: 0.8 10*3/uL — ABNORMAL LOW (ref 1.0–3.6)
MCH: 33.6 pg (ref 26.0–34.0)
MCHC: 32.7 g/dL (ref 32.0–36.0)
MCV: 102.6 fL — AB (ref 80.0–100.0)
MONO ABS: 0.3 10*3/uL (ref 0.2–0.9)
Monocytes Relative: 8 %
NEUTROS ABS: 2.1 10*3/uL (ref 1.4–6.5)
NEUTROS PCT: 64 %
Platelets: 192 10*3/uL (ref 150–440)
RBC: 2.7 MIL/uL — ABNORMAL LOW (ref 3.80–5.20)
RDW: 12.8 % (ref 11.5–14.5)
WBC: 3.3 10*3/uL — AB (ref 3.6–11.0)

## 2015-04-16 NOTE — Progress Notes (Signed)
Patient does not have living will.  Former smoker. 

## 2015-04-17 ENCOUNTER — Encounter: Payer: Self-pay | Admitting: Oncology

## 2015-04-17 LAB — CA 125: CA 125: 274.9 U/mL — ABNORMAL HIGH (ref 0.0–38.1)

## 2015-04-17 NOTE — Progress Notes (Signed)
[place] @ [facility] [phone]  Fax:([phone]) [place]: 1936-08-03  MR#: [id number]  VZD#:[id number]  Patient Care Team: Adrian Prows, MD as PCP - General (Infectious Diseases)  CHIEF COMPLAINT:  Chief Complaint  Patient presents with  . 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  Patient was started on carboplatinum and gemcitabine for progressive ovarian cancer Due to poor tolerance to chemotherapy patient is now on a Avastin (December, 2015) 10/2014      Patient started on Taxol and Avastin June, 2016 Because of multiple psychiatric issue patient has been off chemotherapy     Peritoneal carcinomatosis   01/15/2015 Initial Diagnosis Peritoneal carcinomatosis    Ovarian cancer   01/15/2015 Initial Diagnosis Ovarian cancer    Oncology Flowsheet 01/31/2015 01/31/2015 02/01/2015 02/01/2015 02/01/2015 02/02/2015 02/02/2015  ALPRAZolam (XANAX) PO 1 _0  mg 1 mg  LORazepam (ATIVAN) IV - - - - - - -  OLANZapine (ZYPREXA) PO - - - - - - -    INTERVAL HISTORY:  79 year old lady with locally advanced carcinoma 40 patient had been admitted in the rehabilitation and went through multiple changes in the medication.  Patient continues to have increasing abdominal pain and discomfort but less confused.  Appetite has been poor.  Has lost significant weight.  Patient is here for ongoing evaluation regarding ovarian cancer patient has been off chemotherapy for last more than 6 weeks causal significant problems associated with depression and medication. April 16, 2015 Patient is here for ongoing evaluation regarding recurrent ovarian cancer  patient fell and is hurting in both knees.  Walking with the help of walker.  Abdominal pain is improved.  Appetite has been stable.  No nausea no vomiting no diarrhea.  Taking pain medication very frequently. REVIEW OF SYSTEMS:    Gen. status: Patient is alert oriented somewhat depressed.  In mild pain HEENT: No headache.  No soreness in the mouth Cardiac: No chest pain or paroxysmal nocturnal dyspnea Lungs: Shortness of breath on exertion GI: Persistent nausea.  Increasing abdominal pain.  Pain is in the left lower quadrant. Lower extremity no swelling.  Neuro: Continues to have depression.  Dizziness.  No other localizing symptom Psychiatric system: Severe depression being managed by psychiatrist As per HPI. Otherwise, a complete review of systems is negatve.  PAST MEDICAL HISTORY: Past Medical History  Diagnosis Date  . Peritoneal carcinomatosis 01/15/2015  . Ovarian cancer   . Diabetes mellitus without complication   . Anemia   . GERD (gastroesophageal reflux disease)   . Irregular cardiac rhythm   . Chronic back pain   . Depression     PAST SURGICAL HISTORY: Past Surgical History  Procedure Laterality Date  . Abdominal hysterectomy    . Appendectomy    . Cholecystectomy    . Heel spur surgery    . Tubal ligation  ADVANCED DIRECTIVES:  No flowsheet data found.  HEALTH MAINTENANCE: History  Substance Use Topics  . Smoking status: Former Research scientist (life sciences)  . Smokeless tobacco: Not on file  . Alcohol Use: Not on file      Allergies  Allergen Reactions  . Macrodantin [Nitrofurantoin] Hives and Itching  . Morphine And Related Other (See Comments)    Ineffective for pain   . Nitrofurantoin Monohyd Macro Itching and Hives  . Sulfa Antibiotics Rash  . Tequin [Gatifloxacin] Other (See Comments), Rash and Nausea And Vomiting    Patient does not know Unknown reaction    Current Outpatient Prescriptions  Medication Sig Dispense Refill  . ALPRAZolam (XANAX) 1 MG tablet  Take 1 tablet (1 mg total) by mouth every 8 (eight) hours as needed for anxiety. 30 tablet 3  . amLODipine (NORVASC) 10 MG tablet Take 1 tablet (10 mg total) by mouth daily. 30 tablet 0  . cephALEXin (KEFLEX) 500 MG capsule Take 1 capsule (500 mg total) by mouth 3 (three) times daily. 21 capsule 0  . FeFum-FePoly-FA-B Cmp-C-Biot (INTEGRA PLUS) CAPS Take by mouth daily.    . fentaNYL (DURAGESIC - DOSED MCG/HR) 100 MCG/HR Place 1 patch (100 mcg total) onto the skin every 3 (three) days. 10 patch 0  . hydrochlorothiazide (HYDRODIURIL) 25 MG tablet TAKE ONE TABLET EVERY DAY 30 tablet 0  . hydrocortisone (ANUSOL-HC) 25 MG suppository Place 1 suppository (25 mg total) rectally 2 (two) times daily. 12 suppository 2  . lisinopril (PRINIVIL,ZESTRIL) 40 MG tablet TAKE ONE TABLET BY MOUTH EVERY DAY 30 tablet 1  . omeprazole (PRILOSEC OTC) 20 MG tablet Take 20 mg by mouth 2 (two) times daily.    . ondansetron (ZOFRAN) 4 MG tablet Take 4 mg by mouth every 6 (six) hours as needed for nausea or vomiting.    Marland Kitchen oxyCODONE (OXY IR/ROXICODONE) 5 MG immediate release tablet Take 0.5 tablets (2.5 mg total) by mouth every 2 (two) hours as needed for severe pain. 45 tablet 0  . pantoprazole (PROTONIX) 20 MG tablet Take 20 mg by mouth daily.    . pantoprazole (PROTONIX) 40 MG tablet TAKE ONE TABLET TWICE DAILY 60 tablet 3  . phenazopyridine (PYRIDIUM) 100 MG tablet Take 1 tablet (100 mg total) by mouth 2 (two) times daily with a meal. 14 tablet 0  . pindolol (VISKEN) 5 MG tablet Take 5 mg by mouth daily.     . polyethylene glycol (MIRALAX / GLYCOLAX) packet Take 17 g by mouth daily as needed (FOR constipation). 14 each 0  . PRISTIQ 50 MG 24 hr tablet TAKE ONE TABLET BY MOUTH EVERY DAY 30 tablet 3  . QUEtiapine (SEROQUEL) 50 MG tablet Take 1 tablet (50 mg total) by mouth at bedtime. 30 tablet 0  . senna-docusate (SENOKOT-S) 8.6-50 MG per tablet Take 1 tablet by mouth every 12 (twelve) hours. 30 tablet 0   No current  facility-administered medications for this visit.    OBJECTIVE:  Filed Vitals:   04/16/15 1547  BP: 164/90  Pulse: 91  Temp: 97.2 F (36.2 C)     Body mass index is 19.37 kg/(m^2).    ECOG FS:2 - Symptomatic, <50% confined to bed  PHYSICAL EXAM:  GENERAL:  Well developed, well nourished, sitting comfortably in the exam room in no acute distress. MENTAL STATUS:  Alert and oriented to person, place and time. HEAD:  Normocephalic, atraumatic, face symmetric, no Cushingoid features. EYES:    Pupils equal round and reactive to light and accomodation.  No conjunctivitis or scleral icterus. ENT:  Oropharynx clear without lesion.  Tongue normal. Mucous membranes moist.  RESPIRATORY:  Clear to auscultation without rales, wheezes or rhonchi. CARDIOVASCULAR:  Regular rate and rhythm without murmur, rub or gallop. . ABDOMEN:  Tenderness in the left lower quadrant BACK:  No CVA tenderness.  No tenderness on percussion of the back or rib cage. SKIN:  No rashes, ulcers or lesions. EXTREMITIES: No edema, no skin discoloration or tenderness.  No palpable cords. LYMPH NODES: No palpable cervical, supraclavicular, axillary or inguinal adenopathy  NEUROLOGICAL: Unremarkable. PSYCH:  Appropriate.  LAB RESULTS:  Appointment on 04/16/2015  Component Date Value Ref Range Status  . WBC 04/16/2015 3.3* 3.6 - 11.0 K/uL Final  . RBC 04/16/2015 2.70* 3.80 - 5.20 MIL/uL Final  . Hemoglobin 04/16/2015 9.1* 12.0 - 16.0 g/dL Final  . HCT 04/16/2015 27.7* 35.0 - 47.0 % Final  . MCV 04/16/2015 102.6* 80.0 - 100.0 fL Final  . MCH 04/16/2015 33.6  26.0 - 34.0 pg Final  . MCHC 04/16/2015 32.7  32.0 - 36.0 g/dL Final  . RDW 04/16/2015 12.8  11.5 - 14.5 % Final  . Platelets 04/16/2015 192  150 - 440 K/uL Final  . Neutrophils Relative % 04/16/2015 64   Final  . Neutro Abs 04/16/2015 2.1  1.4 - 6.5 K/uL Final  . Lymphocytes Relative 04/16/2015 23   Final  . Lymphs Abs 04/16/2015 0.8* 1.0 - 3.6 K/uL Final  .  Monocytes Relative 04/16/2015 8   Final  . Monocytes Absolute 04/16/2015 0.3  0.2 - 0.9 K/uL Final  . Eosinophils Relative 04/16/2015 4   Final  . Eosinophils Absolute 04/16/2015 0.1  0 - 0.7 K/uL Final  . Basophils Relative 04/16/2015 1   Final  . Basophils Absolute 04/16/2015 0.0  0 - 0.1 K/uL Final  . Sodium 04/16/2015 135  135 - 145 mmol/L Final  . Potassium 04/16/2015 5.0  3.5 - 5.1 mmol/L Final  . Chloride 04/16/2015 103  101 - 111 mmol/L Final  . CO2 04/16/2015 28  22 - 32 mmol/L Final  . Glucose, Bld 04/16/2015 118* 65 - 99 mg/dL Final  . BUN 04/16/2015 39* 6 - 20 mg/dL Final  . Creatinine, Ser 04/16/2015 1.81* 0.44 - 1.00 mg/dL Final  . Calcium 04/16/2015 8.8* 8.9 - 10.3 mg/dL Final  . Total Protein 04/16/2015 6.8  6.5 - 8.1 g/dL Final  . Albumin 04/16/2015 3.7  3.5 - 5.0 g/dL Final  . AST 04/16/2015 17  15 - 41 U/L Final  . ALT 04/16/2015 7* 14 - 54 U/L Final  . Alkaline Phosphatase 04/16/2015 80  38 - 126 U/L Final  . Total Bilirubin 04/16/2015 0.4  0.3 - 1.2 mg/dL Final  . GFR calc non Af Amer 04/16/2015 26* >60 mL/min Final  . GFR calc Af Amer 04/16/2015 30* >60 mL/min Final   Comment: (NOTE) The eGFR has been calculated using the CKD EPI equation. This calculation has not been validated in all clinical situations. eGFR's persistently <60 mL/min signify possible Chronic Kidney Disease.   . Anion gap 04/16/2015 4* 5 - 15 Final  . CA 125 04/16/2015 274.9* 0.0 - 38.1 U/mL Final   Comment: (NOTE) Roche ECLIA methodology Performed At: [facility] [place], [place] [id number] Lindon Romp MD LP:[id number]     ASSESSMENT: Ovarian cancer is stage IV recurrent disease. depression Progressive increase in tumor marker will discuss with family regarding options of treatment MEDICAL DECISION MAKING:  All lab  data has been reviewed. Increasing abdominal pain so we will increase fentanyl patch 200 g I had detailed discussion with patient and  family about continuing chemotherapy quality-of-life.  Patient does develop number of issues were chemotherapy started.  At present time if he can control pain with pain medication that would be the best option.  Other options of treatment has been discussed.  This has been discussed with the patient and family. Total duration of visit was5mnutes.  50% or more time was spent in counseling patient and family regarding prognosis and options of treatment and available resources  Patient expressed understanding and was in agreement with this plan. She also understands that She can call clinic at any time with any questions, concerns, or complaints.    Ovarian cancer   Staging form: Ovary, AJCC 7th Edition     Clinical stage from 05/16/2013: Stage IIIC (yT3c, N1, M0) - Signed by LEvlyn Kanner NP on 01/15/2015   JForest Gleason MD   04/17/2015 9:06 AM

## 2015-04-20 ENCOUNTER — Telehealth: Payer: Self-pay | Admitting: *Deleted

## 2015-04-20 ENCOUNTER — Other Ambulatory Visit: Payer: Self-pay | Admitting: Family Medicine

## 2015-04-20 DIAGNOSIS — C569 Malignant neoplasm of unspecified ovary: Secondary | ICD-10-CM

## 2015-04-20 MED ORDER — OXYCODONE HCL 5 MG PO TABS: 2.5000 mg | ORAL_TABLET | ORAL | Status: DC | PRN

## 2015-04-20 MED ORDER — MELOXICAM 7.5 MG PO TABS: 7.5000 mg | ORAL_TABLET | Freq: Every day | ORAL | Status: DC

## 2015-04-20 MED ORDER — FENTANYL 100 MCG/HR TD PT72: 100.0000 ug | MEDICATED_PATCH | TRANSDERMAL | Status: DC

## 2015-04-20 NOTE — Telephone Encounter (Signed)
Pt requests refill on oxycodone and fentanyl patch. Also would like new prescription for meloxicam to help with knee pain.

## 2015-04-22 ENCOUNTER — Other Ambulatory Visit: Payer: Self-pay | Admitting: Oncology

## 2015-04-22 NOTE — Telephone Encounter (Signed)
faxed

## 2015-04-24 ENCOUNTER — Encounter: Payer: Self-pay | Admitting: *Deleted

## 2015-04-24 ENCOUNTER — Telehealth: Payer: Self-pay | Admitting: *Deleted

## 2015-04-24 NOTE — Telephone Encounter (Signed)
Removed form contacts and note added to chart

## 2015-04-26 ENCOUNTER — Emergency Department
Admission: EM | Admit: 2015-04-26 | Discharge: 2015-04-26 | Disposition: A | Discharge status: Home / Self Care | Payer: Medicare Other | Attending: Emergency Medicine | Admitting: Emergency Medicine

## 2015-04-26 ENCOUNTER — Emergency Department: Payer: Medicare Other

## 2015-04-26 ENCOUNTER — Other Ambulatory Visit: Payer: Self-pay

## 2015-04-26 ENCOUNTER — Encounter: Payer: Self-pay | Admitting: Emergency Medicine

## 2015-04-26 DIAGNOSIS — Z043 Encounter for examination and observation following other accident: Secondary | ICD-10-CM | POA: Diagnosis present

## 2015-04-26 DIAGNOSIS — Z87891 Personal history of nicotine dependence: Secondary | ICD-10-CM | POA: Diagnosis not present

## 2015-04-26 DIAGNOSIS — W010XXA Fall on same level from slipping, tripping and stumbling without subsequent striking against object, initial encounter: Secondary | ICD-10-CM | POA: Diagnosis not present

## 2015-04-26 DIAGNOSIS — Y9389 Activity, other specified: Secondary | ICD-10-CM | POA: Insufficient documentation

## 2015-04-26 DIAGNOSIS — E119 Type 2 diabetes mellitus without complications: Secondary | ICD-10-CM | POA: Diagnosis not present

## 2015-04-26 DIAGNOSIS — N189 Chronic kidney disease, unspecified: Secondary | ICD-10-CM

## 2015-04-26 DIAGNOSIS — S82092A Other fracture of left patella, initial encounter for closed fracture: Secondary | ICD-10-CM | POA: Insufficient documentation

## 2015-04-26 DIAGNOSIS — Z792 Long term (current) use of antibiotics: Secondary | ICD-10-CM | POA: Insufficient documentation

## 2015-04-26 DIAGNOSIS — Z79899 Other long term (current) drug therapy: Secondary | ICD-10-CM | POA: Insufficient documentation

## 2015-04-26 DIAGNOSIS — Z79811 Long term (current) use of aromatase inhibitors: Secondary | ICD-10-CM | POA: Insufficient documentation

## 2015-04-26 DIAGNOSIS — S82002A Unspecified fracture of left patella, initial encounter for closed fracture: Secondary | ICD-10-CM

## 2015-04-26 DIAGNOSIS — R809 Proteinuria, unspecified: Secondary | ICD-10-CM

## 2015-04-26 DIAGNOSIS — Y998 Other external cause status: Secondary | ICD-10-CM | POA: Insufficient documentation

## 2015-04-26 DIAGNOSIS — Z791 Long term (current) use of non-steroidal anti-inflammatories (NSAID): Secondary | ICD-10-CM | POA: Insufficient documentation

## 2015-04-26 DIAGNOSIS — Y9289 Other specified places as the place of occurrence of the external cause: Secondary | ICD-10-CM | POA: Insufficient documentation

## 2015-04-26 LAB — CBC WITH DIFFERENTIAL/PLATELET
BASOS ABS: 0 10*3/uL (ref 0–0.1)
BASOS PCT: 1 %
EOS ABS: 0.2 10*3/uL (ref 0–0.7)
Eosinophils Relative: 4 %
HCT: 27.2 % — ABNORMAL LOW (ref 35.0–47.0)
Hemoglobin: 8.9 g/dL — ABNORMAL LOW (ref 12.0–16.0)
Lymphocytes Relative: 21 %
Lymphs Abs: 1.1 10*3/uL (ref 1.0–3.6)
MCH: 33.6 pg (ref 26.0–34.0)
MCHC: 32.8 g/dL (ref 32.0–36.0)
MCV: 102.3 fL — ABNORMAL HIGH (ref 80.0–100.0)
Monocytes Absolute: 0.4 10*3/uL (ref 0.2–0.9)
Monocytes Relative: 8 %
NEUTROS PCT: 66 %
Neutro Abs: 3.4 10*3/uL (ref 1.4–6.5)
Platelets: 184 10*3/uL (ref 150–440)
RBC: 2.66 MIL/uL — ABNORMAL LOW (ref 3.80–5.20)
RDW: 13 % (ref 11.5–14.5)
WBC: 5.1 10*3/uL (ref 3.6–11.0)

## 2015-04-26 LAB — URINALYSIS COMPLETE WITH MICROSCOPIC (ARMC ONLY)
BACTERIA UA: NONE SEEN
BILIRUBIN URINE: NEGATIVE
GLUCOSE, UA: NEGATIVE mg/dL
Hgb urine dipstick: NEGATIVE
Ketones, ur: NEGATIVE mg/dL
LEUKOCYTES UA: NEGATIVE
Nitrite: NEGATIVE
Protein, ur: 100 mg/dL — AB
Specific Gravity, Urine: 1.013 (ref 1.005–1.030)
pH: 5 (ref 5.0–8.0)

## 2015-04-26 LAB — COMPREHENSIVE METABOLIC PANEL
ALT: 6 U/L — ABNORMAL LOW (ref 14–54)
AST: 14 U/L — AB (ref 15–41)
Albumin: 3.7 g/dL (ref 3.5–5.0)
Alkaline Phosphatase: 73 U/L (ref 38–126)
Anion gap: 7 (ref 5–15)
BUN: 42 mg/dL — ABNORMAL HIGH (ref 6–20)
CALCIUM: 9.5 mg/dL (ref 8.9–10.3)
CHLORIDE: 107 mmol/L (ref 101–111)
CO2: 26 mmol/L (ref 22–32)
Creatinine, Ser: 1.49 mg/dL — ABNORMAL HIGH (ref 0.44–1.00)
GFR calc Af Amer: 38 mL/min — ABNORMAL LOW (ref >60)
GFR calc non Af Amer: 33 mL/min — ABNORMAL LOW (ref >60)
Glucose, Bld: 95 mg/dL (ref 65–99)
Potassium: 4.8 mmol/L (ref 3.5–5.1)
Sodium: 140 mmol/L (ref 135–145)
TOTAL PROTEIN: 6.7 g/dL (ref 6.5–8.1)
Total Bilirubin: 0.3 mg/dL (ref 0.3–1.2)

## 2015-04-26 LAB — BRAIN NATRIURETIC PEPTIDE: B NATRIURETIC PEPTIDE 5: 119 pg/mL — AB (ref 0.0–100.0)

## 2015-04-26 MED ORDER — HYDROMORPHONE HCL 2 MG PO TABS: 2.0000 mg | ORAL_TABLET | Freq: Two times a day (BID) | ORAL | Status: DC | PRN

## 2015-04-26 MED ORDER — HYDROMORPHONE HCL 1 MG/ML IJ SOLN
0.5000 mg | Freq: Once | INTRAMUSCULAR | Status: AC
  Administered 2015-04-26: 0.5 mg via INTRAVENOUS
  Filled 2015-04-26: qty 1

## 2015-04-26 MED ORDER — HYDROMORPHONE HCL 1 MG/ML IJ SOLN
0.5000 mg | Freq: Once | INTRAMUSCULAR | Status: AC
  Administered 2015-04-26: 0.5 mg via INTRAVENOUS

## 2015-04-26 NOTE — ED Provider Notes (Signed)
[facility] Emergency Department Provider Note  ____________________________________________  Time seen: Approximately 1:32 PM  I have reviewed the triage vital signs and the nursing notes.   HISTORY  Chief Complaint Fall    HPI Jessica Avery is a 79 y.o. female who presents to the emergency department for evaluation of bilateral lower extremities after a fall 2 weeks ago. She states that she landed on her knees but was able to walk and move around until 2 days ago. She states that she has had a sudden onset of bilateral lower extremity and knee edema. She has never had lower extremity edema this bad. She denies feeling short of breath or chest pain. She is currently under treatment for ovarian cancer. She is not currently undergoing chemotherapy or radiation. She states that she has had decreased urinary output for the past 2 days as well.   Past Medical History  Diagnosis Date  . Peritoneal carcinomatosis 01/15/2015  . Ovarian cancer   . Diabetes mellitus without complication   . Anemia   . GERD (gastroesophageal reflux disease)   . Irregular cardiac rhythm   . Chronic back pain   . Depression     Patient Active Problem List   Diagnosis Date Noted  . Slow transit constipation   . Generalized abdominal pain   . Major depression, recurrent, chronic   . Confusion   . Generalized anxiety disorder   . Acute delirium   . Peritoneal carcinomatosis 01/15/2015  . Ovarian cancer 01/15/2015  . Synovitis of knee 07/08/2014  . Arthralgia of hip 03/05/2014  . Gonalgia 03/05/2014    Past Surgical History  Procedure Laterality Date  . Abdominal hysterectomy    . Appendectomy    . Cholecystectomy    . Heel spur surgery    . Tubal ligation      Current Outpatient Rx  Name  Route  Sig  Dispense  Refill  . ALPRAZolam (XANAX) 1 MG tablet      TAKE ONE TABLET EVERY EIGHT HOURS AS NEEDED FOR ANXIETY   60 tablet   0   . amLODipine (NORVASC) 10 MG  tablet   Oral   Take 1 tablet (10 mg total) by mouth daily.   30 tablet   0   . cephALEXin (KEFLEX) 500 MG capsule   Oral   Take 1 capsule (500 mg total) by mouth 3 (three) times daily.   21 capsule   0   . FeFum-FePoly-FA-B Cmp-C-Biot (INTEGRA PLUS) CAPS   Oral   Take by mouth daily.         . fentaNYL (DURAGESIC - DOSED MCG/HR) 100 MCG/HR   Transdermal   Place 1 patch (100 mcg total) onto the skin every 3 (three) days.   10 patch   0   . hydrochlorothiazide (HYDRODIURIL) 25 MG tablet      TAKE ONE TABLET BY MOUTH EVERY DAY   30 tablet   0   . hydrocortisone (ANUSOL-HC) 25 MG suppository   Rectal   Place 1 suppository (25 mg total) rectally 2 (two) times daily.   12 suppository   2   . HYDROmorphone (DILAUDID) 2 MG tablet   Oral   Take 1 tablet (2 mg total) by mouth every 12 (twelve) hours as needed for severe pain.   10 tablet   0   . lisinopril (PRINIVIL,ZESTRIL) 40 MG tablet      TAKE ONE TABLET BY MOUTH EVERY DAY   30 tablet  1     FOR FUTURE FILL THANKS   . meloxicam (MOBIC) 7.5 MG tablet   Oral   Take 1 tablet (7.5 mg total) by mouth daily.   30 tablet   3   . omeprazole (PRILOSEC OTC) 20 MG tablet   Oral   Take 20 mg by mouth 2 (two) times daily.         . ondansetron (ZOFRAN) 4 MG tablet   Oral   Take 4 mg by mouth every 6 (six) hours as needed for nausea or vomiting.         Marland Kitchen oxyCODONE (OXY IR/ROXICODONE) 5 MG immediate release tablet   Oral   Take 0.5 tablets (2.5 mg total) by mouth every 2 (two) hours as needed for severe pain.   45 tablet   0     Pharmacy to cut tablets before dispensing   . pantoprazole (PROTONIX) 20 MG tablet   Oral   Take 20 mg by mouth daily.         . pantoprazole (PROTONIX) 40 MG tablet      TAKE ONE TABLET TWICE DAILY   60 tablet   3   . phenazopyridine (PYRIDIUM) 100 MG tablet   Oral   Take 1 tablet (100 mg total) by mouth 2 (two) times daily with a meal.   14 tablet   0   . pindolol  (VISKEN) 5 MG tablet   Oral   Take 5 mg by mouth daily.          . polyethylene glycol (MIRALAX / GLYCOLAX) packet   Oral   Take 17 g by mouth daily as needed (FOR constipation).   14 each   0   . PRISTIQ 50 MG 24 hr tablet      TAKE ONE TABLET BY MOUTH EVERY DAY   30 tablet   3     PLEASE SEND RFILLS FOR NEXT FILLTHANK YOU   . QUEtiapine (SEROQUEL) 50 MG tablet   Oral   Take 1 tablet (50 mg total) by mouth at bedtime.   30 tablet   0   . senna-docusate (SENOKOT-S) 8.6-50 MG per tablet   Oral   Take 1 tablet by mouth every 12 (twelve) hours.   30 tablet   0     Allergies Macrodantin; Morphine and related; Nitrofurantoin monohyd macro; Sulfa antibiotics; and Tequin  No family history on file.  Social History History  Substance Use Topics  . Smoking status: Former Research scientist (life sciences)  . Smokeless tobacco: Not on file  . Alcohol Use: No    Review of Systems Constitutional: No fever/chills Eyes: No visual changes. ENT: No sore throat. Cardiovascular: Denies chest pain. Respiratory: Denies shortness of breath. Gastrointestinal: No abdominal pain.  No nausea, no vomiting.  No diarrhea.  No constipation. Genitourinary: Negative for dysuria. Positive for decreased urinary output x 2 days. Musculoskeletal: Negative for back pain. Skin: Negative for rash. Neurological: Negative for headaches, focal weakness or numbness.  10-point ROS otherwise negative.  ____________________________________________   PHYSICAL EXAM:  VITAL SIGNS: ED Triage Vitals  Enc Vitals Group     BP 04/26/15 1253 144/70 mmHg     Pulse Rate 04/26/15 1253 86     Resp 04/26/15 1253 20     Temp 04/26/15 1253 98.3 F (36.8 C)     Temp Source 04/26/15 1253 Oral     SpO2 04/26/15 1253 98 %     Weight 04/26/15 1253 116 lb (52.617 kg)  Height 04/26/15 1253 5\' 4"  (1.626 m)     Head Cir --      Peak Flow --      Pain Score 04/26/15 1251 6     Pain Loc --      Pain Edu? --      Excl. in [place]? --      Constitutional: Alert and oriented. Well appearing and in no acute distress. Eyes: Conjunctivae are normal. PERRL. EOMI. Head: Atraumatic. Nose: No congestion/rhinnorhea. Mouth/Throat: Mucous membranes are moist.  Oropharynx non-erythematous. Neck: No stridor.   Cardiovascular: Normal rate, regular rhythm. Grossly normal heart sounds.  Good peripheral circulation. Respiratory: Normal respiratory effort.  No retractions. Lungs CTAB. Gastrointestinal: Soft and nontender. No distention. No abdominal bruits. No CVA tenderness. Musculoskeletal: Tenderness in bilateral knees as well as calves; 3+ Pitting edema in bilateral lower extremities. Limited ROM of bilateral knees due to pain. Neurologic:  Normal speech and language. No gross focal neurologic deficits are appreciated. No gait instability. Skin:  Skin is warm, dry and intact. No rash noted. Psychiatric: Mood and affect are normal. Speech and behavior are normal.  ____________________________________________   LABS (all labs ordered are listed, but only abnormal results are displayed)  Labs Reviewed  CBC WITH DIFFERENTIAL/PLATELET - Abnormal; Notable for the following:    RBC 2.66 (*)    Hemoglobin 8.9 (*)    HCT 27.2 (*)    MCV 102.3 (*)    All other components within normal limits  COMPREHENSIVE METABOLIC PANEL - Abnormal; Notable for the following:    BUN 42 (*)    Creatinine, Ser 1.49 (*)    AST 14 (*)    ALT 6 (*)    GFR calc non Af Amer 33 (*)    GFR calc Af Amer 38 (*)    All other components within normal limits  BRAIN NATRIURETIC PEPTIDE - Abnormal; Notable for the following:    B Natriuretic Peptide 119.0 (*)    All other components within normal limits  URINALYSIS COMPLETEWITH MICROSCOPIC ([facility] ONLY) - Abnormal; Notable for the following:    Color, Urine YELLOW (*)    APPearance CLEAR (*)    Protein, ur 100 (*)    Squamous Epithelial / LPF 0-5 (*)    All other components within normal limits    ____________________________________________  EKG  Normal sinus rhythm; rate 79; normal axis; no STEMI ____________________________________________  RADIOLOGY  Vertical fracture through the midportion of the left patella--nondisplaced.  No DVT of either lower extremity  Chest x-ray negative for acute pathology.   ____________________________________________   PROCEDURES  Procedure(s) performed: Knee immobilizer applied by Delsa Bern, ER Tech  Critical Care performed: No  ____________________________________________   INITIAL IMPRESSION / ASSESSMENT AND PLAN / ED COURSE  Pertinent labs & imaging results that were available during my care of the patient were reviewed by me and considered in my medical decision making (see chart for details).  Daughter advises that Dr. Oliva Bustard is following Ms. Beedle closely. She states that her "cancer levels" are "going way up" and he has given her 3-6 months.  Labs today are in line with most recent values from 04/16/15. She was advised to use her compression stockings to help with the peripheral edema. She states that she was given "fluid pills" by Dr. Oliva Bustard about 18 months ago. She was advised to speak with him before resuming due to decrease in kidney function. She is to call to schedule a follow up with him tomorrow.  She is  to call and schedule an appointment with her orthopedist tomorrow as well. Knee immobilizer placed in the ER.   Pain was well managed in the emergency department with dilaudid. She states her oxycodone isn't helping at all at home. She will be given a short term prescription and was advised not to take the oxycodone if taking the Dilaudid tablets. She agrees to this plan. ____________________________________________   FINAL CLINICAL IMPRESSION(S) / ED DIAGNOSES  Final diagnoses:  Proteinuria  Patella fracture, left, closed, initial encounter  Renal failure, chronic, unspecified stage      Victorino Dike,  FNP 04/26/15 1806  Lisa Roca, MD 04/27/15 1535

## 2015-04-26 NOTE — ED Notes (Signed)
Fell 2 weeks ago landed on both knees. conts to have pain  And swelling to knees

## 2015-04-26 NOTE — Discharge Instructions (Signed)
Chronic Kidney Disease °Chronic kidney disease occurs when the kidneys are damaged over a long period. The kidneys are two organs that lie on either side of the spine between the middle of the back and the front of the abdomen. The kidneys:  °· Remove wastes and extra water from the blood.   °· Produce important hormones. These help keep bones strong, regulate blood pressure, and help create red blood cells.   °· Balance the fluids and chemicals in the blood and tissues. °A small amount of kidney damage may not cause problems, but a large amount of damage may make it difficult or impossible for the kidneys to work the way they should. If steps are not taken to slow down the kidney damage or stop it from getting worse, the kidneys may stop working permanently. Most of the time, chronic kidney disease does not go away. However, it can often be controlled, and those with the disease can usually live normal lives. °CAUSES  °The most common causes of chronic kidney disease are diabetes and high blood pressure (hypertension). Chronic kidney disease may also be caused by:  °· Diseases that cause the kidneys' filters to become inflamed.   °· Diseases that affect the immune system.   °· Genetic diseases.   °· Medicines that damage the kidneys, such as anti-inflammatory medicines.   °· Poisoning or exposure to toxic substances.   °· A reoccurring kidney or urinary infection.   °· A problem with urine flow. This may be caused by:   °¨ Cancer.   °¨ Kidney stones.   °¨ An enlarged prostate in males. °SIGNS AND SYMPTOMS  °Because the kidney damage in chronic kidney disease occurs slowly, symptoms develop slowly and may not be obvious until the kidney damage becomes severe. A person may have a kidney disease for years without showing any symptoms. Symptoms can include:  °· Swelling (edema) of the legs, ankles, or feet.   °· Tiredness (lethargy).   °· Nausea or vomiting.   °· Confusion.   °· Problems with urination, such as:    °¨ Decreased urine production.   °¨ Frequent urination, especially at night.   °¨ Frequent accidents in children who are potty trained.   °· Muscle twitches and cramps.   °· Shortness of breath.  °· Weakness.   °· Persistent itchiness.   °· Loss of appetite. °· Metallic taste in the mouth. °· Trouble sleeping. °· Slowed development in children. °· Short stature in children. °DIAGNOSIS  °Chronic kidney disease may be detected and diagnosed by tests, including blood, urine, imaging, or kidney biopsy tests.  °TREATMENT  °Most chronic kidney diseases cannot be cured. Treatment usually involves relieving symptoms and preventing or slowing the progression of the disease. Treatment may include:  °· A special diet. You may need to avoid alcohol and foods that are salty and high in potassium.   °· Medicines. These may:   °¨ Lower blood pressure.   °¨ Relieve anemia.   °¨ Relieve swelling.   °¨ Protect the bones. °HOME CARE INSTRUCTIONS  °· Follow your prescribed diet.   °· Take medicines only as directed by your health care provider. Do not take any new medicines (prescription, over-the-counter, or nutritional supplements) unless approved by your health care provider. Many medicines can worsen your kidney damage or need to have the dose adjusted.   °· Quit smoking if you smoke. Talk to your health care provider about a smoking cessation program.   °· Keep all follow-up visits as directed by your health care provider. °SEEK IMMEDIATE MEDICAL CARE IF: °· Your symptoms get worse or you develop new symptoms.   °· You develop symptoms of end-stage kidney disease. These   include:   °¨ Headaches.   °¨ Abnormally dark or light skin.   °¨ Numbness in the hands or feet.   °¨ Easy bruising.   °¨ Frequent hiccups.   °¨ Menstruation stops.   °· You have a fever.   °· You have decreased urine production.   °· You have pain or bleeding when urinating. °MAKE SURE YOU: °· Understand these instructions. °· Will watch your condition. °· Will  get help right away if you are not doing well or get worse. °FOR MORE INFORMATION  °· American Association of Kidney Patients: www.aakp.org °· National Kidney Foundation: www.kidney.org °· American Kidney Fund: www.akfinc.org °· [employer] [facility]: www.lifeoptions.org and www.kidneyschool.org °Document Released: 06/21/2008 Document Revised: 01/27/2014 Document Reviewed: 05/11/2012 °ExitCare® Patient Information ©2015 [employer]. This information is not intended to replace advice given to you by your health care provider. Make sure you discuss any questions you have with your health care provider. ° °

## 2015-04-26 NOTE — ED Notes (Signed)
Discharge and care explained to daughter in detail. Pt in no acute distress at this time and reports understanding instructions.

## 2015-04-27 ENCOUNTER — Telehealth: Payer: Self-pay | Admitting: *Deleted

## 2015-04-27 ENCOUNTER — Inpatient Hospital Stay: Payer: Medicare Other | Attending: Oncology | Admitting: Oncology

## 2015-04-27 VITALS — BP 137/81 | HR 83 | Temp 97.7°F | Wt 126.1 lb

## 2015-04-27 DIAGNOSIS — Z79899 Other long term (current) drug therapy: Secondary | ICD-10-CM | POA: Insufficient documentation

## 2015-04-27 DIAGNOSIS — K219 Gastro-esophageal reflux disease without esophagitis: Secondary | ICD-10-CM | POA: Insufficient documentation

## 2015-04-27 DIAGNOSIS — R634 Abnormal weight loss: Secondary | ICD-10-CM | POA: Diagnosis not present

## 2015-04-27 DIAGNOSIS — R1032 Left lower quadrant pain: Secondary | ICD-10-CM | POA: Diagnosis not present

## 2015-04-27 DIAGNOSIS — R600 Localized edema: Secondary | ICD-10-CM | POA: Insufficient documentation

## 2015-04-27 DIAGNOSIS — R630 Anorexia: Secondary | ICD-10-CM | POA: Diagnosis not present

## 2015-04-27 DIAGNOSIS — Z8543 Personal history of malignant neoplasm of ovary: Secondary | ICD-10-CM | POA: Diagnosis not present

## 2015-04-27 DIAGNOSIS — Z9221 Personal history of antineoplastic chemotherapy: Secondary | ICD-10-CM | POA: Insufficient documentation

## 2015-04-27 DIAGNOSIS — G8929 Other chronic pain: Secondary | ICD-10-CM | POA: Diagnosis not present

## 2015-04-27 DIAGNOSIS — Z87891 Personal history of nicotine dependence: Secondary | ICD-10-CM | POA: Insufficient documentation

## 2015-04-27 DIAGNOSIS — M7989 Other specified soft tissue disorders: Secondary | ICD-10-CM

## 2015-04-27 DIAGNOSIS — F329 Major depressive disorder, single episode, unspecified: Secondary | ICD-10-CM | POA: Diagnosis not present

## 2015-04-27 DIAGNOSIS — M25562 Pain in left knee: Secondary | ICD-10-CM | POA: Insufficient documentation

## 2015-04-27 DIAGNOSIS — E119 Type 2 diabetes mellitus without complications: Secondary | ICD-10-CM | POA: Diagnosis not present

## 2015-04-27 DIAGNOSIS — C786 Secondary malignant neoplasm of retroperitoneum and peritoneum: Secondary | ICD-10-CM | POA: Diagnosis not present

## 2015-04-27 DIAGNOSIS — C569 Malignant neoplasm of unspecified ovary: Secondary | ICD-10-CM | POA: Diagnosis not present

## 2015-04-27 DIAGNOSIS — M549 Dorsalgia, unspecified: Secondary | ICD-10-CM | POA: Diagnosis not present

## 2015-04-27 DIAGNOSIS — Z87311 Personal history of (healed) other pathological fracture: Secondary | ICD-10-CM | POA: Diagnosis not present

## 2015-04-27 DIAGNOSIS — R944 Abnormal results of kidney function studies: Secondary | ICD-10-CM | POA: Insufficient documentation

## 2015-04-27 DIAGNOSIS — R971 Elevated cancer antigen 125 [CA 125]: Secondary | ICD-10-CM | POA: Diagnosis not present

## 2015-04-27 DIAGNOSIS — M25561 Pain in right knee: Secondary | ICD-10-CM | POA: Insufficient documentation

## 2015-04-27 MED ORDER — FUROSEMIDE 40 MG PO TABS: 40.0000 mg | ORAL_TABLET | Freq: Every day | ORAL | Status: AC

## 2015-04-27 NOTE — Telephone Encounter (Signed)
Per  choksi to call in lasix 40 mg daily #30 and no refills. [facility]. Daughter was told that it would be sent in.

## 2015-04-27 NOTE — Telephone Encounter (Signed)
Called Jessica Avery and wanted an appt for her mother. Went to ER over the weekend and she had edema from waist to feet, it is shiny looking and very puffy.  Spoke to choksi and pt will come in this afternoon and christina will call her and let her know today 2 pm.

## 2015-04-27 NOTE — Progress Notes (Signed)
Patient does not have living will.  Former smoker.  Patient here today as acute add on for bilateral lower extremity edema.  Patient fell a couple of weeks ago in the grocery store.

## 2015-04-28 ENCOUNTER — Other Ambulatory Visit: Payer: Self-pay | Admitting: *Deleted

## 2015-04-28 ENCOUNTER — Ambulatory Visit: Payer: Medicare Other

## 2015-04-28 DIAGNOSIS — C569 Malignant neoplasm of unspecified ovary: Secondary | ICD-10-CM

## 2015-04-28 MED ORDER — OXYCODONE HCL 5 MG PO TABS: 2.5000 mg | ORAL_TABLET | ORAL | Status: DC | PRN

## 2015-04-28 NOTE — Telephone Encounter (Signed)
Daughter in office stating patient only has 3 more oxycodone left of which she got # 45 tabs on 7/25, She was also given hydromorphone 2 mg tabs q 12 h # 10 on 04/26/15 when she went to ED post fall as well as being on fentanyl 100 mcg

## 2015-04-29 ENCOUNTER — Encounter: Payer: Self-pay | Admitting: Oncology

## 2015-04-29 ENCOUNTER — Other Ambulatory Visit: Payer: Self-pay | Admitting: Oncology

## 2015-04-29 MED ORDER — CYCLOBENZAPRINE HCL 10 MG PO TABS
ORAL_TABLET | ORAL | Status: AC
  Filled 2015-04-29: qty 1

## 2015-04-29 NOTE — Progress Notes (Signed)
Jessica Avery @ [facility] Telephone:([phone]) [phone]  Fax:([phone]) [name]: October 09, 1935  MR#: [id number]  EHO#:[id number]  Patient Care Team: Jessica Gleason, MD as PCP - General (Oncology)  CHIEF COMPLAINT:  Chief Complaint  Patient presents with  . 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  Patient was started on carboplatinum and gemcitabine for progressive ovarian cancer Due to poor tolerance to chemotherapy patient is now on a Avastin (December, 2015) 10/2014      Patient started on Taxol and Avastin June, 2016 Because of multiple psychiatric issue patient has been off chemotherapy     Peritoneal carcinomatosis   01/15/2015 Initial Diagnosis Peritoneal carcinomatosis    Ovarian cancer   01/15/2015 Initial Diagnosis Ovarian cancer    Oncology Flowsheet 01/31/2015 01/31/2015 02/01/2015 02/01/2015 02/01/2015 02/02/2015 02/02/2015  ALPRAZolam (XANAX) PO 1 mg 1 mg 1 mg 1 mg 1 mg 1 mg 1 mg  LORazepam (ATIVAN) IV - - - - - - -  OLANZapine (ZYPREXA) PO - - - - - - -    INTERVAL HISTORY:  79 year old lady with locally advanced carcinoma 40 patient had been admitted in the rehabilitation and went through multiple changes in the medication.  Patient continues to have increasing abdominal pain and discomfort but less confused.  Appetite has been poor.  Has lost significant weight.  Patient is here for ongoing evaluation regarding ovarian cancer patient has been off chemotherapy for last more than 6 weeks causal significant problems associated with depression and medication. April 16, 2015 Patient is here for ongoing evaluation regarding recurrent ovarian cancer patient fell  and is hurting in both knees.  Walking with the help of walker.  Abdominal pain is improved.  Appetite has been stable.  No nausea no vomiting no diarrhea.  Taking pain medication very frequently. April 27, 2015 Patient continues to have pain in both knees.  Went to emergency room.  Had swelling of both lower extremity.  Ultrasound of both lower extremity was done and there was no evidence of blood clot.  Plain x-ray revealed fracture of patella.  Patient received Dilaudid for pain Patient's condition continues to decline as a rising CA 125 Poor appetite Extremely depressed   REVIEW OF SYSTEMS:    Gen. status: Patient is alert oriented somewhat depressed.  In mild pain HEENT: No headache.  No soreness in the mouth Cardiac: No chest pain or paroxysmal nocturnal dyspnea Lungs: Shortness of breath on exertion GI: Persistent nausea.  Increasing abdominal pain.  Pain is in the left lower quadrant. Lower extremity swelling of both lower extremity  Neuro: Continues to have depression.  Dizziness.  No other localizing symptom Psychiatric system: Severe depression being managed by psychiatrist As per HPI. Otherwise, a complete review of systems is negatve.  PAST MEDICAL HISTORY: Past Medical History  Diagnosis Date  . Peritoneal carcinomatosis 01/15/2015  . Ovarian cancer   . Diabetes mellitus without complication   .  Anemia   . GERD (gastroesophageal reflux disease)   . Irregular cardiac rhythm   . Chronic back pain   . Depression     PAST SURGICAL HISTORY: Past Surgical History  Procedure Laterality Date  . Abdominal hysterectomy    . Appendectomy    . Cholecystectomy    . Heel spur surgery    . Tubal ligation       ADVANCED DIRECTIVES:  No flowsheet data found.  HEALTH MAINTENANCE: History  Substance Use Topics  . Smoking status: Former Research scientist (life sciences)  . Smokeless tobacco: Not on file  . Alcohol Use: No      Allergies  Allergen Reactions  . Macrodantin [Nitrofurantoin] Hives  and Itching  . Morphine And Related Other (See Comments)    Ineffective for pain   . Nitrofurantoin Monohyd Macro Itching and Hives  . Sulfa Antibiotics Rash  . Tequin [Gatifloxacin] Other (See Comments), Rash and Nausea And Vomiting    Patient does not know Unknown reaction    Current Outpatient Prescriptions  Medication Sig Dispense Refill  . ALPRAZolam (XANAX) 1 MG tablet TAKE ONE TABLET EVERY EIGHT HOURS AS NEEDED FOR ANXIETY 60 tablet 0  . amLODipine (NORVASC) 10 MG tablet Take 1 tablet (10 mg total) by mouth daily. 30 tablet 0  . fentaNYL (DURAGESIC - DOSED MCG/HR) 100 MCG/HR Place 1 patch (100 mcg total) onto the skin every 3 (three) days. 10 patch 0  . hydrochlorothiazide (HYDRODIURIL) 25 MG tablet TAKE ONE TABLET BY MOUTH EVERY DAY 30 tablet 0  . hydrocortisone (ANUSOL-HC) 25 MG suppository Place 1 suppository (25 mg total) rectally 2 (two) times daily. 12 suppository 2  . HYDROmorphone (DILAUDID) 2 MG tablet Take 1 tablet (2 mg total) by mouth every 12 (twelve) hours as needed for severe pain. 10 tablet 0  . lisinopril (PRINIVIL,ZESTRIL) 40 MG tablet TAKE ONE TABLET BY MOUTH EVERY DAY 30 tablet 1  . meloxicam (MOBIC) 7.5 MG tablet Take 1 tablet (7.5 mg total) by mouth daily. 30 tablet 3  . omeprazole (PRILOSEC OTC) 20 MG tablet Take 20 mg by mouth 2 (two) times daily.    . ondansetron (ZOFRAN) 4 MG tablet Take 4 mg by mouth every 6 (six) hours as needed for nausea or vomiting.    . pantoprazole (PROTONIX) 20 MG tablet Take 20 mg by mouth daily.    . pantoprazole (PROTONIX) 40 MG tablet TAKE ONE TABLET TWICE DAILY 60 tablet 3  . phenazopyridine (PYRIDIUM) 100 MG tablet Take 1 tablet (100 mg total) by mouth 2 (two) times daily with a meal. 14 tablet 0  . pindolol (VISKEN) 5 MG tablet Take 5 mg by mouth daily.     . polyethylene glycol (MIRALAX / GLYCOLAX) packet Take 17 g by mouth daily as needed (FOR constipation). 14 each 0  . PRISTIQ 50 MG 24 hr tablet TAKE ONE TABLET BY MOUTH  EVERY DAY 30 tablet 3  . QUEtiapine (SEROQUEL) 50 MG tablet Take 1 tablet (50 mg total) by mouth at bedtime. 30 tablet 0  . senna-docusate (SENOKOT-S) 8.6-50 MG per tablet Take 1 tablet by mouth every 12 (twelve) hours. 30 tablet 0  . cephALEXin (KEFLEX) 500 MG capsule Take 1 capsule (500 mg total) by mouth 3 (three) times daily. (Patient not taking: Reported on 04/27/2015) 21 capsule 0  . FeFum-FePoly-FA-B Cmp-C-Biot (INTEGRA PLUS) CAPS Take by mouth daily.    . furosemide (LASIX) 40 MG tablet Take 1 tablet (40 mg total) by mouth daily. 30 tablet 0  .  oxyCODONE (OXY IR/ROXICODONE) 5 MG immediate release tablet Take 0.5 tablets (2.5 mg total) by mouth every 2 (two) hours as needed for severe pain. 45 tablet 0   No current facility-administered medications for this visit.   Facility-Administered Medications Ordered in Other Visits  Medication Dose Route Frequency Provider Last Rate Last Dose  . cyclobenzaprine (FLEXERIL) 10 MG tablet             OBJECTIVE:  Filed Vitals:   04/27/15 1438  BP: 137/81  Pulse: 83  Temp: 97.7 F (36.5 C)     Body mass index is 21.63 kg/(m^2).    ECOG FS:2 - Symptomatic, <50% confined to bed  PHYSICAL EXAM:  GENERAL:  Well developed, well nourished, sitting comfortably in the exam room in no acute distress. MENTAL STATUS:  Alert and oriented to person, place and time. HEAD:  Normocephalic, atraumatic, face symmetric, no Cushingoid features. EYES:    Pupils equal round and reactive to light and accomodation.  No conjunctivitis or scleral icterus. ENT:  Oropharynx clear without lesion.  Tongue normal. Mucous membranes moist.  RESPIRATORY:  Clear to auscultation without rales, wheezes or rhonchi. CARDIOVASCULAR:  Regular rate and rhythm without murmur, rub or gallop. . ABDOMEN:  Tenderness in the left lower quadrant BACK:  No CVA tenderness.  No tenderness on percussion of the back or rib cage. SKIN:  No rashes, ulcers or lesions. EXTREMITIES: Swelling of  both lower extremity.  Tenderness in both knee area.  LYMPH NODES: No palpable cervical, supraclavicular, axillary or inguinal adenopathy  NEUROLOGICAL: Unremarkable. PSYCH:  Appropriate.  LAB RESULTS:  Admission on 04/26/2015, Discharged on 04/26/2015  Component Date Value Ref Range Status  . WBC 04/26/2015 5.1  3.6 - 11.0 K/uL Final  . RBC 04/26/2015 2.66* 3.80 - 5.20 MIL/uL Final  . Hemoglobin 04/26/2015 8.9* 12.0 - 16.0 g/dL Final  . HCT 04/26/2015 27.2* 35.0 - 47.0 % Final  . MCV 04/26/2015 102.3* 80.0 - 100.0 fL Final  . MCH 04/26/2015 33.6  26.0 - 34.0 pg Final  . MCHC 04/26/2015 32.8  32.0 - 36.0 g/dL Final  . RDW 04/26/2015 13.0  11.5 - 14.5 % Final  . Platelets 04/26/2015 184  150 - 440 K/uL Final  . Neutrophils Relative % 04/26/2015 66   Final  . Neutro Abs 04/26/2015 3.4  1.4 - 6.5 K/uL Final  . Lymphocytes Relative 04/26/2015 21   Final  . Lymphs Abs 04/26/2015 1.1  1.0 - 3.6 K/uL Final  . Monocytes Relative 04/26/2015 8   Final  . Monocytes Absolute 04/26/2015 0.4  0.2 - 0.9 K/uL Final  . Eosinophils Relative 04/26/2015 4   Final  . Eosinophils Absolute 04/26/2015 0.2  0 - 0.7 K/uL Final  . Basophils Relative 04/26/2015 1   Final  . Basophils Absolute 04/26/2015 0.0  0 - 0.1 K/uL Final  . Sodium 04/26/2015 140  135 - 145 mmol/L Final  . Potassium 04/26/2015 4.8  3.5 - 5.1 mmol/L Final  . Chloride 04/26/2015 107  101 - 111 mmol/L Final  . CO2 04/26/2015 26  22 - 32 mmol/L Final  . Glucose, Bld 04/26/2015 95  65 - 99 mg/dL Final  . BUN 04/26/2015 42* 6 - 20 mg/dL Final  . Creatinine, Ser 04/26/2015 1.49* 0.44 - 1.00 mg/dL Final  . Calcium 04/26/2015 9.5  8.9 - 10.3 mg/dL Final  . Total Protein 04/26/2015 6.7  6.5 - 8.1 g/dL Final  . Albumin 04/26/2015 3.7  3.5 - 5.0 g/dL Final  .  AST 04/26/2015 14* 15 - 41 U/L Final  . ALT 04/26/2015 6* 14 - 54 U/L Final  . Alkaline Phosphatase 04/26/2015 73  38 - 126 U/L Final  . Total Bilirubin 04/26/2015 0.3  0.3 - 1.2 mg/dL  Final  . GFR calc non Af Amer 04/26/2015 33* >60 mL/min Final  . GFR calc Af Amer 04/26/2015 38* >60 mL/min Final   Comment: (NOTE) The eGFR has been calculated using the CKD EPI equation. This calculation has not been validated in all clinical situations. eGFR's persistently <60 mL/min signify possible Chronic Kidney Disease.   . Anion gap 04/26/2015 7  5 - 15 Final  . B Natriuretic Peptide 04/26/2015 119.0* 0.0 - 100.0 pg/mL Final  . Color, Urine 04/26/2015 YELLOW* YELLOW Final  . APPearance 04/26/2015 CLEAR* CLEAR Final  . Glucose, UA 04/26/2015 NEGATIVE  NEGATIVE mg/dL Final  . Bilirubin Urine 04/26/2015 NEGATIVE  NEGATIVE Final  . Ketones, ur 04/26/2015 NEGATIVE  NEGATIVE mg/dL Final  . Specific Gravity, Urine 04/26/2015 1.013  1.005 - 1.030 Final  . Hgb urine dipstick 04/26/2015 NEGATIVE  NEGATIVE Final  . pH 04/26/2015 5.0  5.0 - 8.0 Final  . Protein, ur 04/26/2015 100* NEGATIVE mg/dL Final  . Nitrite 04/26/2015 NEGATIVE  NEGATIVE Final  . Leukocytes, UA 04/26/2015 NEGATIVE  NEGATIVE Final  . RBC / HPF 04/26/2015 0-5  0 - 5 RBC/hpf Final  . WBC, UA 04/26/2015 0-5  0 - 5 WBC/hpf Final  . Bacteria, UA 04/26/2015 NONE SEEN  NONE SEEN Final  . Squamous Epithelial / LPF 04/26/2015 0-5* NONE SEEN Final  . Mucous 04/26/2015 PRESENT   Final    ASSESSMENT: Ovarian cancer is stage IV recurrent disease. depression Progressive increase in tumor marker will discuss with family regarding options of treatment Progressive swelling of both lower extremity MEDICAL DECISION MAKING:  Recurrent ovarian cancer progressing disease Swelling of both lower extremity with -VE Doppler study Patient is extremely concerned will start patient on Lasix Patient has anemia Rising tumor markers we discussed his findings Family does not think that she wants any further chemotherapy However patient may think differently and that issue will be addressed with the family. Reevaluate patient in one week  after fluid.  Was started. Continue oxycodone on an fentanyl patch   Patient expressed understanding and was in agreement with this plan. She also understands that She can call clinic at any time with any questions, concerns, or complaints.    Ovarian cancer   Staging form: Ovary, AJCC 7th Edition     Clinical stage from 05/16/2013: Stage IIIC (yT3c, N1, M0) - Signed by Evlyn Kanner, NP on 01/15/2015   Jessica Gleason, MD   04/29/2015 8:14 AM

## 2015-04-30 ENCOUNTER — Other Ambulatory Visit: Payer: Self-pay | Admitting: Oncology

## 2015-05-05 ENCOUNTER — Telehealth: Payer: Self-pay | Admitting: *Deleted

## 2015-05-05 DIAGNOSIS — C569 Malignant neoplasm of unspecified ovary: Secondary | ICD-10-CM

## 2015-05-05 MED ORDER — OXYCODONE HCL 5 MG PO TABS: 2.5000 mg | ORAL_TABLET | ORAL | Status: DC | PRN

## 2015-05-05 NOTE — Telephone Encounter (Signed)
Informed that prescription is ready to pick up  

## 2015-05-07 ENCOUNTER — Telehealth: Payer: Self-pay | Admitting: *Deleted

## 2015-05-07 NOTE — Telephone Encounter (Signed)
Called [name] to report that Jessica Avery has called several time leaving messages stating he needs to talk to [place] about his mother. I just wanted to check with her to be sure that everything is ok with her mother. She said that she is going by there this afternoon adn will call us to let us know if anything needs to be addressed. I told her I am not calling him back

## 2015-05-13 ENCOUNTER — Other Ambulatory Visit: Payer: Self-pay | Admitting: *Deleted

## 2015-05-13 DIAGNOSIS — C569 Malignant neoplasm of unspecified ovary: Secondary | ICD-10-CM

## 2015-05-13 MED ORDER — OXYCODONE HCL 5 MG PO TABS: 2.5000 mg | ORAL_TABLET | ORAL | Status: DC | PRN

## 2015-05-13 NOTE — Telephone Encounter (Signed)
[name] called back to state that Jessica Avery went to her and said she needs her Oxycodone, not the Dilaudid and she asked if it is time to refill it.

## 2015-05-13 NOTE — Telephone Encounter (Signed)
Jessica Avery came into office this morning requesting a refill of pt dilaudid. I spoke with Beverlee Nims her daughter who states she has been taking 1/2 a dilaudid, 1/2 Oxycodone and her fentanyl. Beverlee Nims stated to tell Jessica Avery to go home and that she will speak with her sister and call me back if she needs a refill

## 2015-05-13 NOTE — Telephone Encounter (Signed)
Informed Jessica Avery that prescription is ready to pick up and of change in dosing

## 2015-05-14 ENCOUNTER — Inpatient Hospital Stay (HOSPITAL_BASED_OUTPATIENT_CLINIC_OR_DEPARTMENT_OTHER): Payer: Medicare Other | Admitting: Oncology

## 2015-05-14 ENCOUNTER — Encounter: Payer: Self-pay | Admitting: Oncology

## 2015-05-14 ENCOUNTER — Inpatient Hospital Stay: Payer: Medicare Other

## 2015-05-14 VITALS — BP 150/83 | HR 93 | Temp 98.3°F | Wt 122.5 lb

## 2015-05-14 DIAGNOSIS — C801 Malignant (primary) neoplasm, unspecified: Secondary | ICD-10-CM

## 2015-05-14 DIAGNOSIS — Z9221 Personal history of antineoplastic chemotherapy: Secondary | ICD-10-CM | POA: Diagnosis not present

## 2015-05-14 DIAGNOSIS — R600 Localized edema: Secondary | ICD-10-CM

## 2015-05-14 DIAGNOSIS — R634 Abnormal weight loss: Secondary | ICD-10-CM

## 2015-05-14 DIAGNOSIS — R971 Elevated cancer antigen 125 [CA 125]: Secondary | ICD-10-CM

## 2015-05-14 DIAGNOSIS — C786 Secondary malignant neoplasm of retroperitoneum and peritoneum: Secondary | ICD-10-CM

## 2015-05-14 DIAGNOSIS — M25562 Pain in left knee: Secondary | ICD-10-CM

## 2015-05-14 DIAGNOSIS — F329 Major depressive disorder, single episode, unspecified: Secondary | ICD-10-CM

## 2015-05-14 DIAGNOSIS — Z8543 Personal history of malignant neoplasm of ovary: Secondary | ICD-10-CM

## 2015-05-14 DIAGNOSIS — R1032 Left lower quadrant pain: Secondary | ICD-10-CM

## 2015-05-14 DIAGNOSIS — Z87311 Personal history of (healed) other pathological fracture: Secondary | ICD-10-CM

## 2015-05-14 DIAGNOSIS — C569 Malignant neoplasm of unspecified ovary: Secondary | ICD-10-CM | POA: Diagnosis not present

## 2015-05-14 DIAGNOSIS — R630 Anorexia: Secondary | ICD-10-CM

## 2015-05-14 LAB — CBC WITH DIFFERENTIAL/PLATELET
BASOS PCT: 1 %
Basophils Absolute: 0 10*3/uL (ref 0–0.1)
Eosinophils Absolute: 0.1 10*3/uL (ref 0–0.7)
Eosinophils Relative: 3 %
HEMATOCRIT: 28.2 % — AB (ref 35.0–47.0)
HEMOGLOBIN: 9.5 g/dL — AB (ref 12.0–16.0)
Lymphocytes Relative: 20 %
Lymphs Abs: 0.9 10*3/uL — ABNORMAL LOW (ref 1.0–3.6)
MCH: 33.4 pg (ref 26.0–34.0)
MCHC: 33.6 g/dL (ref 32.0–36.0)
MCV: 99.4 fL (ref 80.0–100.0)
MONO ABS: 0.4 10*3/uL (ref 0.2–0.9)
Monocytes Relative: 8 %
NEUTROS ABS: 3.2 10*3/uL (ref 1.4–6.5)
NEUTROS PCT: 68 %
Platelets: 206 10*3/uL (ref 150–440)
RBC: 2.83 MIL/uL — ABNORMAL LOW (ref 3.80–5.20)
RDW: 12.9 % (ref 11.5–14.5)
WBC: 4.7 10*3/uL (ref 3.6–11.0)

## 2015-05-14 LAB — URINALYSIS COMPLETE WITH MICROSCOPIC (ARMC ONLY)
BACTERIA UA: NONE SEEN
Bilirubin Urine: NEGATIVE
GLUCOSE, UA: NEGATIVE mg/dL
HGB URINE DIPSTICK: NEGATIVE
Ketones, ur: NEGATIVE mg/dL
Nitrite: POSITIVE — AB
PH: 5 (ref 5.0–8.0)
Protein, ur: 100 mg/dL — AB
RBC / HPF: NONE SEEN RBC/hpf (ref 0–5)
Specific Gravity, Urine: 1.018 (ref 1.005–1.030)

## 2015-05-14 LAB — COMPREHENSIVE METABOLIC PANEL
ALBUMIN: 4.1 g/dL (ref 3.5–5.0)
ALT: 7 U/L — ABNORMAL LOW (ref 14–54)
AST: 15 U/L (ref 15–41)
Alkaline Phosphatase: 90 U/L (ref 38–126)
Anion gap: 6 (ref 5–15)
BILIRUBIN TOTAL: 0.4 mg/dL (ref 0.3–1.2)
BUN: 54 mg/dL — AB (ref 6–20)
CO2: 24 mmol/L (ref 22–32)
Calcium: 8.8 mg/dL — ABNORMAL LOW (ref 8.9–10.3)
Chloride: 108 mmol/L (ref 101–111)
Creatinine, Ser: 1.8 mg/dL — ABNORMAL HIGH (ref 0.44–1.00)
GFR calc Af Amer: 30 mL/min — ABNORMAL LOW (ref >60)
GFR calc non Af Amer: 26 mL/min — ABNORMAL LOW (ref >60)
Glucose, Bld: 106 mg/dL — ABNORMAL HIGH (ref 65–99)
POTASSIUM: 5.3 mmol/L — AB (ref 3.5–5.1)
Sodium: 138 mmol/L (ref 135–145)
TOTAL PROTEIN: 7.2 g/dL (ref 6.5–8.1)

## 2015-05-14 NOTE — Progress Notes (Signed)
Jessica Avery @ [facility] [phone]  Fax:([phone] [place]: 10-Jan-1936  MR#: [id number]  NOI#:[id number]  Patient Care Team: Forest Gleason, MD as PCP - General (Oncology)  CHIEF COMPLAINT:  Chief Complaint  Patient presents with  . 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  Patient was started on carboplatinum and gemcitabine for progressive ovarian cancer Due to poor tolerance to chemotherapy patient is now on a Avastin (December, 2015) 10/2014      Patient started on Taxol and Avastin June, 2016 Because of multiple psychiatric issue patient has been off chemotherapy     Peritoneal carcinomatosis   01/15/2015 Initial Diagnosis Peritoneal carcinomatosis    Ovarian cancer   01/15/2015 Initial Diagnosis Ovarian cancer    Oncology Flowsheet 01/31/2015 01/31/2015 02/01/2015 02/01/2015 02/01/2015 02/02/2015 02/02/2015  ALPRAZolam (XANAX) PO 1 _0  mg 1 mg  LORazepam (ATIVAN) IV - - - - - - -  OLANZapine (ZYPREXA) PO - - - - - - -    INTERVAL HISTORY:  79 year old lady with locally advanced carcinoma 40 patient had been admitted in the rehabilitation and went through multiple changes in the medication.  Patient continues to have increasing abdominal pain and discomfort but less confused.  Appetite has been poor.  Has lost significant weight.  Patient is here for ongoing evaluation regarding ovarian cancer patient has been off chemotherapy for last more than 6 weeks causal significant problems associated with depression and medication. April 16, 2015 Patient is here for ongoing evaluation regarding recurrent ovarian cancer patient fell  and is hurting in both knees.  Walking with the help of walker.  Abdominal pain is improved.  Appetite has been stable.  No nausea no vomiting no diarrhea.  Taking pain medication very frequently. April 27, 2015 Patient continues to have pain in both knees.  Went to emergency room.  Had swelling of both lower extremity.  Ultrasound of both lower extremity was done and there was no evidence of blood clot.  Plain x-ray revealed fracture of patella.  Patient received Dilaudid for pain Patient's condition continues to decline as a rising CA 125 Poor appetite Extremely depressed May 14, 2015 Patient and family here for further evaluation and treatment consideration. Appetite is improved.  Abdominal discomfort is better. The patient had a fracture in the knee which continues to bother patient.  Patient's social situation is very difficult  REVIEW OF SYSTEMS:    Gen. status: Patient is alert oriented somewhat depressed.  In mild pain HEENT: No headache.  No soreness in the mouth Cardiac: No chest pain or paroxysmal nocturnal dyspnea Lungs: Shortness of breath on exertion GI: Persistent nausea.  Increasing abdominal pain.  Pain is in the left lower quadrant. Lower extremity swelling of both lower extremity  Neuro: Continues to have depression.  Dizziness.  No other localizing symptom Psychiatric system: Severe depression being managed by psychiatrist As  per HPI. Otherwise, a complete review of systems is negatve.  PAST MEDICAL HISTORY: Past Medical History  Diagnosis Date  . Peritoneal carcinomatosis 01/15/2015  . Ovarian cancer   . Diabetes mellitus without complication   . Anemia   . GERD (gastroesophageal reflux disease)   . Irregular cardiac rhythm   . Chronic back pain   . Depression     PAST SURGICAL HISTORY: Past Surgical History  Procedure Laterality Date  . Abdominal hysterectomy    . Appendectomy    . Cholecystectomy    . Heel spur surgery    . Tubal ligation        ADVANCED DIRECTIVES:  Patient does not have any living will or healthcare power of attorney.  Information was given .  Available resources had been discussed.  We will follow-up on subsequent appointments regarding this issue HEALTH MAINTENANCE: Social History  Substance Use Topics  . Smoking status: Former Research scientist (life sciences)  . Smokeless tobacco: None  . Alcohol Use: No      Allergies  Allergen Reactions  . Macrodantin [Nitrofurantoin] Hives and Itching  . Morphine And Related Other (See Comments)    Ineffective for pain   . Nitrofurantoin Monohyd Macro Itching and Hives  . Sulfa Antibiotics Rash  . Tequin [Gatifloxacin] Other (See Comments), Rash and Nausea And Vomiting    Patient does not know Unknown reaction    Current Outpatient Prescriptions  Medication Sig Dispense Refill  . ALPRAZolam (XANAX) 1 MG tablet TAKE 1 TABLET BY MOUTH EVERY 8 HOURS AS NEEDED FOR ANXIETY 60 tablet 0  . amLODipine (NORVASC) 10 MG tablet Take 1 tablet (10 mg total) by mouth daily. 30 tablet 0  . cephALEXin (KEFLEX) 500 MG capsule Take 1 capsule (500 mg total) by mouth 3 (three) times daily. 21 capsule 0  . FeFum-FePoly-FA-B Cmp-C-Biot (INTEGRA PLUS) CAPS Take by mouth daily.    . fentaNYL (DURAGESIC - DOSED MCG/HR) 100 MCG/HR Place 1 patch (100 mcg total) onto the skin every 3 (three) days. 10 patch 0  . furosemide (LASIX) 40 MG tablet Take 1 tablet (40 mg total) by mouth daily. 30 tablet 0  . hydrochlorothiazide (HYDRODIURIL) 25 MG tablet TAKE ONE TABLET BY MOUTH EVERY DAY 30 tablet 0  . hydrocortisone (ANUSOL-HC) 25 MG suppository Place 1 suppository (25 mg total) rectally 2 (two) times daily. 12 suppository 2  . lisinopril (PRINIVIL,ZESTRIL) 40 MG tablet TAKE ONE TABLET BY MOUTH EVERY DAY 30 tablet 1  . meloxicam (MOBIC) 7.5 MG tablet Take 1 tablet (7.5 mg total) by mouth daily. 30 tablet 3  . omeprazole (PRILOSEC OTC) 20 MG tablet Take 20 mg by mouth 2 (two) times daily.    . ondansetron (ZOFRAN) 4  MG tablet Take 4 mg by mouth every 6 (six) hours as needed for nausea or vomiting.    Marland Kitchen oxyCODONE (OXY IR/ROXICODONE) 5 MG immediate release tablet Take 0.5 tablets (2.5 mg total) by mouth every 4 (four) hours as needed for severe pain. 30 tablet 0  . pantoprazole (PROTONIX) 20 MG tablet Take 20 mg by mouth daily.    . pantoprazole (PROTONIX) 40 MG tablet TAKE ONE TABLET TWICE DAILY 60 tablet 3  . phenazopyridine (PYRIDIUM) 100 MG tablet Take 1 tablet (100 mg total) by mouth 2 (two) times daily with a meal. 14 tablet 0  . pindolol (VISKEN) 5 MG tablet Take 5 mg by mouth daily.     . polyethylene glycol (MIRALAX / GLYCOLAX) packet Take 17 g by mouth daily  as needed (FOR constipation). 14 each 0  . polyethylene glycol powder (GLYCOLAX/MIRALAX) powder TAKE 17 GRAMS ONCE A DAY 527 g 1  . PRISTIQ 50 MG 24 hr tablet TAKE ONE TABLET BY MOUTH EVERY DAY 30 tablet 3  . QUEtiapine (SEROQUEL) 50 MG tablet Take 1 tablet (50 mg total) by mouth at bedtime. 30 tablet 0  . senna-docusate (SENOKOT-S) 8.6-50 MG per tablet Take 1 tablet by mouth every 12 (twelve) hours. 30 tablet 0   No current facility-administered medications for this visit.    OBJECTIVE:  Filed Vitals:   05/14/15 1545  BP: 150/83  Pulse: 93  Temp: 98.3 F (36.8 C)     Body mass index is 21.02 kg/(m^2).    ECOG FS:2 - Symptomatic, <50% confined to bed  PHYSICAL EXAM:  GENERAL:  Well developed, well nourished, sitting comfortably in the exam room in no acute distress. MENTAL STATUS:  Alert and oriented to person, place and time. HEAD:  Normocephalic, atraumatic, face symmetric, no Cushingoid features. EYES:    Pupils equal round and reactive to light and accomodation.  No conjunctivitis or scleral icterus. ENT:  Oropharynx clear without lesion.  Tongue normal. Mucous membranes moist.  RESPIRATORY:  Clear to auscultation without rales, wheezes or rhonchi. CARDIOVASCULAR:  Regular rate and rhythm without murmur, rub or  gallop. . ABDOMEN:  Tenderness in the left lower quadrant BACK:  No CVA tenderness.  No tenderness on percussion of the back or rib cage. SKIN:  No rashes, ulcers or lesions. EXTREMITIES: Swelling of both lower extremity.  Tenderness in both knee area.  LYMPH NODES: No palpable cervical, supraclavicular, axillary or inguinal adenopathy  NEUROLOGICAL: Unremarkable. PSYCH:  Appropriate.  LAB RESULTS:  Appointment on 05/14/2015  Component Date Value Ref Range Status  . WBC 05/14/2015 4.7  3.6 - 11.0 K/uL Final  . RBC 05/14/2015 2.83* 3.80 - 5.20 MIL/uL Final  . Hemoglobin 05/14/2015 9.5* 12.0 - 16.0 g/dL Final  . HCT 05/14/2015 28.2* 35.0 - 47.0 % Final  . MCV 05/14/2015 99.4  80.0 - 100.0 fL Final  . MCH 05/14/2015 33.4  26.0 - 34.0 pg Final  . MCHC 05/14/2015 33.6  32.0 - 36.0 g/dL Final  . RDW 05/14/2015 12.9  11.5 - 14.5 % Final  . Platelets 05/14/2015 206  150 - 440 K/uL Final  . Neutrophils Relative % 05/14/2015 68   Final  . Neutro Abs 05/14/2015 3.2  1.4 - 6.5 K/uL Final  . Lymphocytes Relative 05/14/2015 20   Final  . Lymphs Abs 05/14/2015 0.9* 1.0 - 3.6 K/uL Final  . Monocytes Relative 05/14/2015 8   Final  . Monocytes Absolute 05/14/2015 0.4  0.2 - 0.9 K/uL Final  . Eosinophils Relative 05/14/2015 3   Final  . Eosinophils Absolute 05/14/2015 0.1  0 - 0.7 K/uL Final  . Basophils Relative 05/14/2015 1   Final  . Basophils Absolute 05/14/2015 0.0  0 - 0.1 K/uL Final  . Sodium 05/14/2015 138  135 - 145 mmol/L Final  . Potassium 05/14/2015 5.3* 3.5 - 5.1 mmol/L Final  . Chloride 05/14/2015 108  101 - 111 mmol/L Final  . CO2 05/14/2015 24  22 - 32 mmol/L Final  . Glucose, Bld 05/14/2015 106* 65 - 99 mg/dL Final  . BUN 05/14/2015 54* 6 - 20 mg/dL Final  . Creatinine, Ser 05/14/2015 1.80* 0.44 - 1.00 mg/dL Final  . Calcium 05/14/2015 8.8* 8.9 - 10.3 mg/dL Final  . Total Protein 05/14/2015 7.2  6.5 - 8.1 g/dL  Final  . Albumin 05/14/2015 4.1  3.5 - 5.0 g/dL Final  . AST  05/14/2015 15  15 - 41 U/L Final  . ALT 05/14/2015 7* 14 - 54 U/L Final  . Alkaline Phosphatase 05/14/2015 90  38 - 126 U/L Final  . Total Bilirubin 05/14/2015 0.4  0.3 - 1.2 mg/dL Final  . GFR calc non Af Amer 05/14/2015 26* >60 mL/min Final  . GFR calc Af Amer 05/14/2015 30* >60 mL/min Final   Comment: (NOTE) The eGFR has been calculated using the CKD EPI equation. This calculation has not been validated in all clinical situations. eGFR's persistently <60 mL/min signify possible Chronic Kidney Disease.   . Anion gap 05/14/2015 6  5 - 15 Final  . Color, Urine 05/14/2015 YELLOW* YELLOW Final  . APPearance 05/14/2015 CLEAR* CLEAR Final  . Glucose, UA 05/14/2015 NEGATIVE  NEGATIVE mg/dL Final  . Bilirubin Urine 05/14/2015 NEGATIVE  NEGATIVE Final  . Ketones, ur 05/14/2015 NEGATIVE  NEGATIVE mg/dL Final  . Specific Gravity, Urine 05/14/2015 1.018  1.005 - 1.030 Final  . Hgb urine dipstick 05/14/2015 NEGATIVE  NEGATIVE Final  . pH 05/14/2015 5.0  5.0 - 8.0 Final  . Protein, ur 05/14/2015 100* NEGATIVE mg/dL Final  . Nitrite 05/14/2015 POSITIVE* NEGATIVE Final  . Leukocytes, UA 05/14/2015 TRACE* NEGATIVE Final  . RBC / HPF 05/14/2015 NONE SEEN  0 - 5 RBC/hpf Final  . WBC, UA 05/14/2015 0-5  0 - 5 WBC/hpf Final  . Bacteria, UA 05/14/2015 NONE SEEN  NONE SEEN Final  . Squamous Epithelial / LPF 05/14/2015 0-5* NONE SEEN Final  . Amorphous Crystal 05/14/2015 PRESENT   Final    ASSESSMENT: Ovarian cancer is stage IV recurrent disease. depression Progressive increase in tumor marker will discuss with family regarding options of treatment Progressive swelling of both lower extremity MEDICAL DECISION MAKING:  Recurrent ovarian cancer progressing disease Swelling of both lower extremity with -VE Doppler study Patient is extremely concerned will start patient on Lasix Patient has anemia Rising tumor markers we discussed his findings Family does not think that she wants any further  chemotherapy However patient may think differently and that issue will be addressed with the family. Swelling in lower extremity has improved Rose a rising serum creatinine and BUN patient has been taken off diuretic medication Continue oxycodone on an fentanyl patch  I had a long discussion with patient's family both daughter  And son  present.  I would like them  To appoint one  person managing pain medication as sometimes forget multiple request for narcotic prescription. Family agreed to it . Her  Elder daughter is going to manage pain medication. At present time other than knee pain most of the pain is well controlled.  History of rising tumor marker has been explained.  patient's quality of life at present time is pretty stable and does not desire any further intervention or chemotherapy Patient expressed understanding and was in agreement with this plan. She also understands that She can call clinic at any time with any questions, concerns, or complaints.  Total duration of visit was 35 minutes.  50% or more time was spent in counseling patient and family regarding prognosis and options of treatment and available resources No further chemotherapy is being planned  Ovarian cancer   Staging form: Ovary, AJCC 7th Edition     Clinical stage from 05/16/2013: Stage IIIC (yT3c, N1, M0) - Signed by Evlyn Kanner, NP on 01/15/2015   Forest Gleason, MD  05/14/2015 10:34 PM

## 2015-05-14 NOTE — Progress Notes (Signed)
Patient does not have living will.  Former smoker. 

## 2015-05-15 ENCOUNTER — Telehealth: Payer: Self-pay | Admitting: *Deleted

## 2015-05-15 DIAGNOSIS — C569 Malignant neoplasm of unspecified ovary: Secondary | ICD-10-CM

## 2015-05-15 LAB — CA 125: CA 125: 500.1 U/mL — ABNORMAL HIGH (ref 0.0–38.1)

## 2015-05-15 MED ORDER — CIPROFLOXACIN HCL 500 MG PO TABS: 500.0000 mg | ORAL_TABLET | Freq: Two times a day (BID) | ORAL | Status: AC

## 2015-05-15 NOTE — Telephone Encounter (Signed)
Abnormal UA, per Magda Paganini, will call in cipro. Pt to callback if not better.

## 2015-05-18 ENCOUNTER — Other Ambulatory Visit: Payer: Self-pay | Admitting: Family Medicine

## 2015-05-18 ENCOUNTER — Other Ambulatory Visit: Payer: Self-pay | Admitting: Oncology

## 2015-05-20 ENCOUNTER — Other Ambulatory Visit: Payer: Self-pay | Admitting: Family Medicine

## 2015-05-20 ENCOUNTER — Telehealth: Payer: Self-pay | Admitting: *Deleted

## 2015-05-20 DIAGNOSIS — C569 Malignant neoplasm of unspecified ovary: Secondary | ICD-10-CM

## 2015-05-20 MED ORDER — OXYCODONE HCL 5 MG PO TABS: 2.5000 mg | ORAL_TABLET | ORAL | Status: DC | PRN

## 2015-05-20 MED ORDER — FENTANYL 100 MCG/HR TD PT72: 100.0000 ug | MEDICATED_PATCH | TRANSDERMAL | Status: AC

## 2015-05-20 NOTE — Telephone Encounter (Signed)
Informed that prescription is ready to pick up  

## 2015-05-21 ENCOUNTER — Inpatient Hospital Stay: Payer: Medicare Other

## 2015-05-21 ENCOUNTER — Other Ambulatory Visit: Payer: Self-pay | Admitting: Family Medicine

## 2015-05-21 DIAGNOSIS — C569 Malignant neoplasm of unspecified ovary: Secondary | ICD-10-CM

## 2015-05-21 DIAGNOSIS — C786 Secondary malignant neoplasm of retroperitoneum and peritoneum: Secondary | ICD-10-CM | POA: Diagnosis not present

## 2015-05-21 LAB — CBC WITH DIFFERENTIAL/PLATELET
BASOS ABS: 0 10*3/uL (ref 0–0.1)
Basophils Relative: 1 %
Eosinophils Absolute: 0.2 10*3/uL (ref 0–0.7)
Eosinophils Relative: 4 %
HEMATOCRIT: 28.8 % — AB (ref 35.0–47.0)
Hemoglobin: 9.6 g/dL — ABNORMAL LOW (ref 12.0–16.0)
LYMPHS PCT: 16 %
Lymphs Abs: 0.8 10*3/uL — ABNORMAL LOW (ref 1.0–3.6)
MCH: 32.8 pg (ref 26.0–34.0)
MCHC: 33.2 g/dL (ref 32.0–36.0)
MCV: 98.8 fL (ref 80.0–100.0)
Monocytes Absolute: 0.3 10*3/uL (ref 0.2–0.9)
Monocytes Relative: 7 %
NEUTROS ABS: 3.5 10*3/uL (ref 1.4–6.5)
Neutrophils Relative %: 72 %
Platelets: 191 10*3/uL (ref 150–440)
RBC: 2.92 MIL/uL — AB (ref 3.80–5.20)
RDW: 12.5 % (ref 11.5–14.5)
WBC: 4.8 10*3/uL (ref 3.6–11.0)

## 2015-05-21 LAB — BASIC METABOLIC PANEL
Anion gap: 2 — ABNORMAL LOW (ref 5–15)
BUN: 48 mg/dL — ABNORMAL HIGH (ref 6–20)
CO2: 27 mmol/L (ref 22–32)
Calcium: 8.5 mg/dL — ABNORMAL LOW (ref 8.9–10.3)
Chloride: 106 mmol/L (ref 101–111)
Creatinine, Ser: 1.65 mg/dL — ABNORMAL HIGH (ref 0.44–1.00)
GFR calc Af Amer: 34 mL/min — ABNORMAL LOW (ref >60)
GFR, EST NON AFRICAN AMERICAN: 29 mL/min — AB (ref >60)
Glucose, Bld: 129 mg/dL — ABNORMAL HIGH (ref 65–99)
POTASSIUM: 4.4 mmol/L (ref 3.5–5.1)
SODIUM: 135 mmol/L (ref 135–145)

## 2015-05-21 MED ORDER — DEXAMETHASONE SODIUM PHOSPHATE 10 MG/ML IJ SOLN
10.0000 mg | Freq: Once | INTRAMUSCULAR | Status: DC
  Filled 2015-05-21: qty 1

## 2015-05-21 MED ORDER — SODIUM CHLORIDE 0.9 % IV SOLN
10.0000 mg | Freq: Once | INTRAVENOUS | Status: AC
  Administered 2015-05-21: 10 mg via INTRAVENOUS
  Filled 2015-05-21: qty 1

## 2015-05-21 MED ORDER — SODIUM CHLORIDE 0.9 % IJ SOLN
10.0000 mL | INTRAMUSCULAR | Status: DC | PRN
  Filled 2015-05-21: qty 10

## 2015-05-21 MED ORDER — SODIUM CHLORIDE 0.9 % IV SOLN
Freq: Once | INTRAVENOUS | Status: AC
  Administered 2015-05-21: 15:00:00 via INTRAVENOUS
  Filled 2015-05-21: qty 1000

## 2015-05-21 MED ORDER — HEPARIN SOD (PORK) LOCK FLUSH 100 UNIT/ML IV SOLN
500.0000 [IU] | Freq: Once | INTRAVENOUS | Status: AC
  Administered 2015-05-21: 500 [IU] via INTRAVENOUS
  Filled 2015-05-21: qty 5

## 2015-05-22 ENCOUNTER — Telehealth: Payer: Self-pay | Admitting: *Deleted

## 2015-05-22 ENCOUNTER — Other Ambulatory Visit: Payer: Self-pay | Admitting: Family Medicine

## 2015-05-22 DIAGNOSIS — C569 Malignant neoplasm of unspecified ovary: Secondary | ICD-10-CM

## 2015-05-22 MED ORDER — OXYCODONE HCL 5 MG PO TABS: 2.5000 mg | ORAL_TABLET | ORAL | Status: DC | PRN

## 2015-05-22 NOTE — Telephone Encounter (Signed)
Per L Herring, AGNP-C Rx for another 15 tabs written, Will not increase dose, was here yesterday and said nothing about med not controlling pain at that time.

## 2015-05-22 NOTE — Telephone Encounter (Signed)
Notified Beverlee Nims of new rx and that no increase in pain med. Verbalized understanding. She asked when the next Oxycodone rx can be requested told her not due until 9/2 to request on 9/1

## 2015-05-22 NOTE — Telephone Encounter (Signed)
Family reports that she is crying with pain in her ribs and 1/2 Oxycodone is not controlling her pain. She is also on Fentanyl 117mcg. Beverlee Nims reports that Dominica Severin told her he is giving her a whole tablet for pain and she will run out of medication over the weekend

## 2015-05-25 ENCOUNTER — Telehealth: Payer: Self-pay | Admitting: *Deleted

## 2015-05-25 NOTE — Telephone Encounter (Signed)
Received hospice referral last week. [facility] intake needs the patient's last H&P and facesheet to complete documentation.  This documentation was sent via secure fax at [phone] to attn. Denise at 1016am 05/25/15

## 2015-05-27 ENCOUNTER — Telehealth: Payer: Self-pay | Admitting: *Deleted

## 2015-05-27 DIAGNOSIS — C569 Malignant neoplasm of unspecified ovary: Secondary | ICD-10-CM

## 2015-05-27 MED ORDER — OXYCODONE HCL 5 MG PO TABS: 2.5000 mg | ORAL_TABLET | ORAL | Status: DC | PRN

## 2015-05-27 NOTE — Telephone Encounter (Signed)
Faxed

## 2015-05-28 ENCOUNTER — Inpatient Hospital Stay: Attending: Oncology | Admitting: Oncology

## 2015-05-28 ENCOUNTER — Inpatient Hospital Stay

## 2015-05-28 ENCOUNTER — Encounter: Payer: Self-pay | Admitting: Oncology

## 2015-05-28 VITALS — BP 130/85 | HR 78 | Wt 127.1 lb

## 2015-05-28 DIAGNOSIS — F329 Major depressive disorder, single episode, unspecified: Secondary | ICD-10-CM | POA: Diagnosis not present

## 2015-05-28 DIAGNOSIS — C569 Malignant neoplasm of unspecified ovary: Secondary | ICD-10-CM | POA: Diagnosis not present

## 2015-05-28 DIAGNOSIS — Z87891 Personal history of nicotine dependence: Secondary | ICD-10-CM | POA: Insufficient documentation

## 2015-05-28 DIAGNOSIS — R110 Nausea: Secondary | ICD-10-CM | POA: Diagnosis not present

## 2015-05-28 DIAGNOSIS — Z9221 Personal history of antineoplastic chemotherapy: Secondary | ICD-10-CM | POA: Diagnosis not present

## 2015-05-28 DIAGNOSIS — M25561 Pain in right knee: Secondary | ICD-10-CM | POA: Insufficient documentation

## 2015-05-28 DIAGNOSIS — R630 Anorexia: Secondary | ICD-10-CM | POA: Insufficient documentation

## 2015-05-28 DIAGNOSIS — C801 Malignant (primary) neoplasm, unspecified: Secondary | ICD-10-CM

## 2015-05-28 DIAGNOSIS — K219 Gastro-esophageal reflux disease without esophagitis: Secondary | ICD-10-CM | POA: Diagnosis not present

## 2015-05-28 DIAGNOSIS — E119 Type 2 diabetes mellitus without complications: Secondary | ICD-10-CM | POA: Insufficient documentation

## 2015-05-28 DIAGNOSIS — Z8543 Personal history of malignant neoplasm of ovary: Secondary | ICD-10-CM | POA: Diagnosis not present

## 2015-05-28 DIAGNOSIS — M25562 Pain in left knee: Secondary | ICD-10-CM | POA: Diagnosis not present

## 2015-05-28 DIAGNOSIS — R600 Localized edema: Secondary | ICD-10-CM | POA: Insufficient documentation

## 2015-05-28 DIAGNOSIS — D649 Anemia, unspecified: Secondary | ICD-10-CM | POA: Diagnosis not present

## 2015-05-28 DIAGNOSIS — R971 Elevated cancer antigen 125 [CA 125]: Secondary | ICD-10-CM | POA: Insufficient documentation

## 2015-05-28 DIAGNOSIS — Z79899 Other long term (current) drug therapy: Secondary | ICD-10-CM | POA: Insufficient documentation

## 2015-05-28 DIAGNOSIS — R109 Unspecified abdominal pain: Secondary | ICD-10-CM | POA: Insufficient documentation

## 2015-05-28 DIAGNOSIS — G8929 Other chronic pain: Secondary | ICD-10-CM | POA: Insufficient documentation

## 2015-05-28 DIAGNOSIS — N390 Urinary tract infection, site not specified: Secondary | ICD-10-CM | POA: Insufficient documentation

## 2015-05-28 DIAGNOSIS — M549 Dorsalgia, unspecified: Secondary | ICD-10-CM | POA: Diagnosis not present

## 2015-05-28 DIAGNOSIS — C786 Secondary malignant neoplasm of retroperitoneum and peritoneum: Secondary | ICD-10-CM | POA: Insufficient documentation

## 2015-05-28 DIAGNOSIS — Z639 Problem related to primary support group, unspecified: Secondary | ICD-10-CM | POA: Diagnosis not present

## 2015-05-28 MED ORDER — MELOXICAM 15 MG PO TABS: 15.0000 mg | ORAL_TABLET | Freq: Every day | ORAL | Status: AC

## 2015-05-28 NOTE — Progress Notes (Signed)
Patient does not have living will.  Former smoker. Patient states she wants to be left alone.  States her family is good to her but she is tired and does not feel good and sometimes they get on her nerves when they all come at one time.  Patient states she is having pain in her arms and shoulders - not sure if she is having muscle or bone pain. Patient further states she feels like she has another UTI - urinary frequency, burning, small amounts of urine.

## 2015-05-29 ENCOUNTER — Other Ambulatory Visit: Payer: Self-pay

## 2015-05-29 ENCOUNTER — Encounter: Payer: Self-pay | Admitting: Oncology

## 2015-05-29 DIAGNOSIS — C786 Secondary malignant neoplasm of retroperitoneum and peritoneum: Secondary | ICD-10-CM | POA: Diagnosis not present

## 2015-05-29 DIAGNOSIS — C801 Malignant (primary) neoplasm, unspecified: Principal | ICD-10-CM

## 2015-05-29 LAB — URINALYSIS COMPLETE WITH MICROSCOPIC (ARMC ONLY)
BILIRUBIN URINE: NEGATIVE
Bacteria, UA: NONE SEEN
GLUCOSE, UA: NEGATIVE mg/dL
HGB URINE DIPSTICK: NEGATIVE
Ketones, ur: NEGATIVE mg/dL
LEUKOCYTES UA: NEGATIVE
Nitrite: NEGATIVE
Protein, ur: 100 mg/dL — AB
RBC / HPF: NONE SEEN RBC/hpf (ref 0–5)
Specific Gravity, Urine: 1.016 (ref 1.005–1.030)
pH: 5 (ref 5.0–8.0)

## 2015-05-29 LAB — URINE DRUG SCREEN, QUALITATIVE (ARMC ONLY)
AMPHETAMINES, UR SCREEN: NOT DETECTED — AB
Barbiturates, Ur Screen: NOT DETECTED — AB
Benzodiazepine, Ur Scrn: POSITIVE — AB
COCAINE METABOLITE, UR ~~LOC~~: NOT DETECTED — AB
Cannabinoid 50 Ng, Ur ~~LOC~~: NOT DETECTED — AB
MDMA (ECSTASY) UR SCREEN: NOT DETECTED — AB
METHADONE SCREEN, URINE: NOT DETECTED — AB
OPIATE, UR SCREEN: NOT DETECTED — AB
Phencyclidine (PCP) Ur S: NOT DETECTED — AB
Tricyclic, Ur Screen: NOT DETECTED — AB

## 2015-05-29 NOTE — Progress Notes (Signed)
[place] @ [facility] [phone]  Fax:([phone] [place]: [birth year]  MR#: [id number]  MPN#:[id number]  Patient Care Team: Forest Gleason, MD as PCP - General (Oncology)  CHIEF COMPLAINT:  Chief Complaint  Patient presents with  . 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  Patient was started on carboplatinum and gemcitabine for progressive ovarian cancer Due to poor tolerance to chemotherapy patient is now on a Avastin (December, 2015) 10/2014      Patient started on Taxol and Avastin June, 2016 Because of multiple psychiatric issue patient has been off chemotherapy   August of 2016 Patient has a rising tumor markers increasing abdominal pain.  At the request of the patient and family patient has been referred to hospice care for end-of-life care    Oncology Flowsheet 01/31/2015 02/01/2015 02/01/2015 02/01/2015 02/02/2015 02/02/2015 05/21/2015  ALPRAZolam (XANAX) PO 1 mg 1 mg 1 mg 1 mg 1 mg 1 mg -  dexamethasone (DECADRON) IV - - - - - - 10 mg  LORazepam (ATIVAN) IV - - - - - - -  OLANZapine (ZYPREXA) PO - - - - - - -    INTERVAL HISTORY:  79 year old lady with locally advanced carcinoma 40 patient had been admitted in the rehabilitation and went through multiple changes in the medication.  Patient continues to have increasing abdominal pain and discomfort but less confused.  Appetite has been poor.  Has lost significant weight.  Patient is here for ongoing evaluation regarding ovarian cancer patient has been off chemotherapy for last more than 6 weeks causal significant problems associated with depression and medication. April 16, 2015 Patient is  here for ongoing evaluation regarding recurrent ovarian cancer patient fell and is hurting in both knees.  Walking with the help of walker.  Abdominal pain is improved.  Appetite has been stable.  No nausea no vomiting no diarrhea.  Taking pain medication very frequently. April 27, 2015 Patient continues to have pain in both knees.  Went to emergency room.  Had swelling of both lower extremity.  Ultrasound of both lower extremity was done and there was no evidence of blood clot.  Plain x-ray revealed fracture of patella.  Patient received Dilaudid for pain Patient's condition continues to decline as a rising CA 125 Poor appetite Extremely depressed May 14, 2015 Patient and family here for further evaluation and treatment consideration. Appetite is improved.  Abdominal discomfort is better. The patient had a fracture in the knee which continues to bother patient. May 28, 2015 Patient and family came today for the follow-up since last evaluation patient having multiple issues.  There is also problem associated with family dynamics.  The patient has increasing abdominal pain.  Continues to have aches and pains all over the body.  Patient is extremely depressed. A few days ago early morning patient and family decided to call hospice.  As a rising CA-125.  Increasing abdominal distention.  Expand All  Collapse All   Patient does not have living will. Former smoker. Patient states she wants to be left alone. States her family is good to her but she is tired and does not feel good and sometimes they get on her nerves when they all come at one time. Patient states she is having pain in her arms and shoulders - not sure if she is having muscle or bone pain. Patient further states she feels like she has another UTI - urinary frequency, burning, small amounts of urine    Patient's social situation is very difficult  REVIEW OF SYSTEMS:    Gen. status: Patient is alert oriented somewhat  depressed Increasing problems with family dynamics  HEENT: No headache.  No soreness in the mouth Cardiac: No chest pain or paroxysmal nocturnal dyspnea Lungs: Shortness of breath on exertion GI: Persistent nausea.  Increasing abdominal pain.  Pain is in the left lower quadrant. Increasing pain mainly in the right lower quadrant  Lower extremity swelling of both lower extremity  Neuro: Continues to have depression.  Dizziness.  No other localizing symptom Psychiatric system: Severe depression being managed by psychiatrist Musculoskeletal system: Diffuse bony pain and muscle pain  GU: Has dysuria and burning  As per HPI. Otherwise, a complete review of systems is negatve.  PAST MEDICAL HISTORY: Past Medical History  Diagnosis Date  . Peritoneal carcinomatosis 01/15/2015  . Ovarian cancer   . Diabetes mellitus without complication   . Anemia   . GERD (gastroesophageal reflux disease)   . Irregular cardiac rhythm   . Chronic back pain   . Depression     PAST SURGICAL HISTORY: Past Surgical History  Procedure Laterality Date  . Abdominal hysterectomy    . Appendectomy    . Cholecystectomy    . Heel spur surgery    . Tubal ligation       ADVANCED DIRECTIVES:  Patient does not have any living will or healthcare power of attorney.  Information was given .  Available resources had been discussed.  We will follow-up on subsequent appointments regarding this issue HEALTH MAINTENANCE: Social History  Substance Use Topics  . Smoking status: Former Research scientist (life sciences)  . Smokeless tobacco: None  . Alcohol Use: No      Allergies  Allergen Reactions  . Macrodantin [Nitrofurantoin] Hives and Itching  . Morphine And Related Other (See Comments)    Ineffective for pain   . Nitrofurantoin Monohyd Macro Itching and Hives  . Sulfa Antibiotics Rash  . Tequin [Gatifloxacin] Other (See Comments), Rash and Nausea And Vomiting    Patient does not know Unknown reaction    Current Outpatient  Prescriptions  Medication Sig Dispense Refill  . ALPRAZolam (XANAX) 1 MG tablet TAKE 1 TABLET BY MOUTH EVERY 8 HOURS AS NEEDED FOR ANXIETY 60 tablet 0  . amLODipine (NORVASC) 10 MG tablet Take 1 tablet (10 mg total) by mouth daily. 30 tablet 0  . FeFum-FePoly-FA-B Cmp-C-Biot (INTEGRA PLUS) CAPS Take by mouth daily.    . fentaNYL (DURAGESIC - DOSED MCG/HR) 100 MCG/HR Place 1 patch (100 mcg total) onto the skin every 3 (three) days. 10 patch 0  . furosemide (LASIX) 40 MG tablet Take 1 tablet (40 mg total) by mouth daily. 30 tablet 0  . hydrochlorothiazide (HYDRODIURIL) 25 MG tablet TAKE ONE TABLET BY MOUTH EVERY DAY 30 tablet 2  . hydrocortisone (ANUSOL-HC) 25 MG suppository Place 1 suppository (25 mg total) rectally 2 (two) times daily. 12 suppository 2  . lisinopril (PRINIVIL,ZESTRIL) 40  MG tablet TAKE ONE TABLET BY MOUTH EVERY DAY 30 tablet 1  . omeprazole (PRILOSEC OTC) 20 MG tablet Take 20 mg by mouth 2 (two) times daily.    . ondansetron (ZOFRAN) 4 MG tablet Take 4 mg by mouth every 6 (six) hours as needed for nausea or vomiting.    Marland Kitchen oxyCODONE (OXY IR/ROXICODONE) 5 MG immediate release tablet Take 0.5 tablets (2.5 mg total) by mouth every 4 (four) hours as needed for severe pain. 40 tablet 0  . pantoprazole (PROTONIX) 20 MG tablet Take 20 mg by mouth daily.    . pantoprazole (PROTONIX) 40 MG tablet TAKE ONE TABLET TWICE DAILY 60 tablet 3  . phenazopyridine (PYRIDIUM) 100 MG tablet Take 1 tablet (100 mg total) by mouth 2 (two) times daily with a meal. 14 tablet 0  . pindolol (VISKEN) 5 MG tablet Take 5 mg by mouth daily.     . polyethylene glycol (MIRALAX / GLYCOLAX) packet Take 17 g by mouth daily as needed (FOR constipation). 14 each 0  . polyethylene glycol powder (GLYCOLAX/MIRALAX) powder TAKE 17 GRAMS ONCE A DAY 527 g 1  . PRISTIQ 50 MG 24 hr tablet TAKE ONE TABLET BY MOUTH EVERY DAY 30 tablet 3  . QUEtiapine (SEROQUEL) 50 MG tablet Take 1 tablet (50 mg total) by mouth at bedtime. 30  tablet 0  . senna-docusate (SENOKOT-S) 8.6-50 MG per tablet Take 1 tablet by mouth every 12 (twelve) hours. 30 tablet 0  . cephALEXin (KEFLEX) 500 MG capsule Take 1 capsule (500 mg total) by mouth 3 (three) times daily. (Patient not taking: Reported on 05/28/2015) 21 capsule 0  . ciprofloxacin (CIPRO) 500 MG tablet Take 1 tablet (500 mg total) by mouth 2 (two) times daily. (Patient not taking: Reported on 05/28/2015) 14 tablet 0  . meloxicam (MOBIC) 15 MG tablet Take 1 tablet (15 mg total) by mouth daily. 30 tablet 3   No current facility-administered medications for this visit.    OBJECTIVE:  Filed Vitals:   05/28/15 1527  BP: 130/85  Pulse: 78     Body mass index is 21.81 kg/(m^2).    ECOG FS:2 - Symptomatic, <50% confined to bed  PHYSICAL EXAM:  GENERAL:  Well developed, well nourished, sitting comfortably in the exam room in no acute distress. MENTAL STATUS:  Alert and oriented to person, place and time. HEAD:  Normocephalic, atraumatic, face symmetric, no Cushingoid features. EYES:    Pupils equal round and reactive to light and accomodation.  No conjunctivitis or scleral icterus. ENT:  Oropharynx clear without lesion.  Tongue normal. Mucous membranes moist.  RESPIRATORY:  Clear to auscultation without rales, wheezes or rhonchi. CARDIOVASCULAR:  Regular rate and rhythm without murmur, rub or gallop. . ABDOMEN:  Tenderness in the left lower quadrant BACK:  No CVA tenderness.  No tenderness on percussion of the back or rib cage. SKIN:  No rashes, ulcers or lesions. EXTREMITIES: Swelling of both lower extremity.  Tenderness in both knee area.  LYMPH NODES: No palpable cervical, supraclavicular, axillary or inguinal adenopathy  NEUROLOGICAL: Unremarkable. PSYCH:  Appropriate.  LAB RESULTS: No visits with results within 2 Day(s) from this visit. Latest known visit with results is:  Infusion on 05/21/2015  Component Date Value Ref Range Status  . Sodium 05/21/2015 135  135 - 145  mmol/L Final  . Potassium 05/21/2015 4.4  3.5 - 5.1 mmol/L Final  . Chloride 05/21/2015 106  101 - 111 mmol/L Final  . CO2 05/21/2015 27  22 -  32 mmol/L Final  . Glucose, Bld 05/21/2015 129* 65 - 99 mg/dL Final  . BUN 05/21/2015 48* 6 - 20 mg/dL Final  . Creatinine, Ser 05/21/2015 1.65* 0.44 - 1.00 mg/dL Final  . Calcium 05/21/2015 8.5* 8.9 - 10.3 mg/dL Final  . GFR calc non Af Amer 05/21/2015 29* >60 mL/min Final  . GFR calc Af Amer 05/21/2015 34* >60 mL/min Final   Comment: (NOTE) The eGFR has been calculated using the CKD EPI equation. This calculation has not been validated in all clinical situations. eGFR's persistently <60 mL/min signify possible Chronic Kidney Disease.   . Anion gap 05/21/2015 2* 5 - 15 Final  . WBC 05/21/2015 4.8  3.6 - 11.0 K/uL Final  . RBC 05/21/2015 2.92* 3.80 - 5.20 MIL/uL Final  . Hemoglobin 05/21/2015 9.6* 12.0 - 16.0 g/dL Final  . HCT 05/21/2015 28.8* 35.0 - 47.0 % Final  . MCV 05/21/2015 98.8  80.0 - 100.0 fL Final  . MCH 05/21/2015 32.8  26.0 - 34.0 pg Final  . MCHC 05/21/2015 33.2  32.0 - 36.0 g/dL Final  . RDW 05/21/2015 12.5  11.5 - 14.5 % Final  . Platelets 05/21/2015 191  150 - 440 K/uL Final  . Neutrophils Relative % 05/21/2015 72   Final  . Neutro Abs 05/21/2015 3.5  1.4 - 6.5 K/uL Final  . Lymphocytes Relative 05/21/2015 16   Final  . Lymphs Abs 05/21/2015 0.8* 1.0 - 3.6 K/uL Final  . Monocytes Relative 05/21/2015 7   Final  . Monocytes Absolute 05/21/2015 0.3  0.2 - 0.9 K/uL Final  . Eosinophils Relative 05/21/2015 4   Final  . Eosinophils Absolute 05/21/2015 0.2  0 - 0.7 K/uL Final  . Basophils Relative 05/21/2015 1   Final  . Basophils Absolute 05/21/2015 0.0  0 - 0.1 K/uL Final    ASSESSMENT: Ovarian cancer is stage IV recurrent disease. depression Progressive increase in tumor marker will discuss with family regarding options of treatment Progressive swelling of both lower extremity MEDICAL DECISION MAKING:  Recurrent ovarian  cancer progressing disease Swelling of both lower extremity with -VE Doppler study Patient is extremely concerned will start patient on Lasix Patient has anemia Rising tumor markers we discussed his findings Family does not think that she wants any further chemotherapy However patient may think differently and that issue will be addressed with the family. Swelling in lower extremity has improved Rose a rising serum creatinine and BUN patient has been taken off diuretic medication Continue oxycodone on an fentanyl patch   At this point in time patient and family has decided to hospice care.  I had a private top with patient regarding overall poor prognosis.  Patient did not wanted any family member could not get involved in this discussion.  We discussed options of treatment versus end-of-life care.   Patient was referred to hospice  He also discussed about living will and need for assigning healthcare power of attorney because of patient's poor social situation  Meloxicam 15 mg has been started.   Patient understood that this point in time that there are number of other choices available for treatment of ovarian cancer including immunotherapy  Patient expressed understanding and was in agreement with this plan. She also understands that She can call clinic at any time with any questions, concerns, or complaints.    2.  Urinary tract infection.  Urine analysis is being done.  Because of family dynamic urine for drug screening has been ordered. Total duration of visit  was 45  minutes.  50% or more time was spent in counseling patient and family regarding prognosis and options of treatment and available resources No further chemotherapy is being planned  Ovarian cancer   Staging form: Ovary, AJCC 7th Edition     Clinical stage from 05/16/2013: Stage IIIC (yT3c, N1, M0) - Signed by Evlyn Kanner, NP on 01/15/2015   Forest Gleason, MD   05/29/2015 10:34 AM

## 2015-05-30 LAB — URINE CULTURE

## 2015-06-02 ENCOUNTER — Telehealth: Payer: Self-pay | Admitting: *Deleted

## 2015-06-02 NOTE — Telephone Encounter (Signed)
Called regarding pain med, she reports that she only has 2 more days of pain pills left and there should be well over a week of medication left.Patient reports that she is having pain which is not well controlled Per patient to Hospice nurse, she is not taking that much pain med. Asking about increasing her ativan, current dose 1 mg q 8 h for increased anxiety

## 2015-06-02 NOTE — Telephone Encounter (Signed)
Called to inquire about urine culture results. Per Dr Oliva Bustard will consider recollecting at her next visit.  Her drug screen has come back and does not show any opiates in her urine, so Dr Oliva Bustard said we will not give her any more Oxycodone

## 2015-06-03 ENCOUNTER — Other Ambulatory Visit: Payer: Self-pay | Admitting: *Deleted

## 2015-06-03 ENCOUNTER — Inpatient Hospital Stay

## 2015-06-03 DIAGNOSIS — C569 Malignant neoplasm of unspecified ovary: Secondary | ICD-10-CM

## 2015-06-03 DIAGNOSIS — C786 Secondary malignant neoplasm of retroperitoneum and peritoneum: Secondary | ICD-10-CM | POA: Diagnosis not present

## 2015-06-03 LAB — URINE DRUG SCREEN, QUALITATIVE (ARMC ONLY)
Amphetamines, Ur Screen: NOT DETECTED
BARBITURATES, UR SCREEN: NOT DETECTED
BENZODIAZEPINE, UR SCRN: POSITIVE — AB
Cannabinoid 50 Ng, Ur ~~LOC~~: NOT DETECTED
Cocaine Metabolite,Ur ~~LOC~~: NOT DETECTED
MDMA (Ecstasy)Ur Screen: NOT DETECTED
METHADONE SCREEN, URINE: NOT DETECTED
OPIATE, UR SCREEN: NOT DETECTED
PHENCYCLIDINE (PCP) UR S: NOT DETECTED
Tricyclic, Ur Screen: NOT DETECTED

## 2015-06-03 LAB — URINALYSIS COMPLETE WITH MICROSCOPIC (ARMC ONLY)
BILIRUBIN URINE: NEGATIVE
Bacteria, UA: NONE SEEN
Glucose, UA: NEGATIVE mg/dL
HGB URINE DIPSTICK: NEGATIVE
KETONES UR: NEGATIVE mg/dL
LEUKOCYTES UA: NEGATIVE
Nitrite: NEGATIVE
PH: 5 (ref 5.0–8.0)
Protein, ur: NEGATIVE mg/dL
RBC / HPF: NONE SEEN RBC/hpf (ref 0–5)
Specific Gravity, Urine: 1.008 (ref 1.005–1.030)

## 2015-06-03 NOTE — Addendum Note (Signed)
Addended by: Telford Nab on: 06/03/2015 11:05 AM   Modules accepted: Orders

## 2015-06-04 ENCOUNTER — Inpatient Hospital Stay (HOSPITAL_BASED_OUTPATIENT_CLINIC_OR_DEPARTMENT_OTHER): Admitting: Oncology

## 2015-06-04 VITALS — BP 163/90 | HR 90 | Temp 97.8°F | Wt 127.5 lb

## 2015-06-04 DIAGNOSIS — Z9221 Personal history of antineoplastic chemotherapy: Secondary | ICD-10-CM | POA: Diagnosis not present

## 2015-06-04 DIAGNOSIS — R630 Anorexia: Secondary | ICD-10-CM

## 2015-06-04 DIAGNOSIS — R971 Elevated cancer antigen 125 [CA 125]: Secondary | ICD-10-CM

## 2015-06-04 DIAGNOSIS — R600 Localized edema: Secondary | ICD-10-CM

## 2015-06-04 DIAGNOSIS — C786 Secondary malignant neoplasm of retroperitoneum and peritoneum: Secondary | ICD-10-CM | POA: Diagnosis not present

## 2015-06-04 DIAGNOSIS — Z639 Problem related to primary support group, unspecified: Secondary | ICD-10-CM

## 2015-06-04 DIAGNOSIS — F329 Major depressive disorder, single episode, unspecified: Secondary | ICD-10-CM

## 2015-06-04 DIAGNOSIS — R110 Nausea: Secondary | ICD-10-CM

## 2015-06-04 DIAGNOSIS — C569 Malignant neoplasm of unspecified ovary: Secondary | ICD-10-CM | POA: Diagnosis not present

## 2015-06-04 DIAGNOSIS — R109 Unspecified abdominal pain: Secondary | ICD-10-CM

## 2015-06-04 DIAGNOSIS — Z8543 Personal history of malignant neoplasm of ovary: Secondary | ICD-10-CM | POA: Diagnosis not present

## 2015-06-04 DIAGNOSIS — D649 Anemia, unspecified: Secondary | ICD-10-CM

## 2015-06-04 MED ORDER — FENTANYL 25 MCG/HR TD PT72
25.0000 ug | MEDICATED_PATCH | TRANSDERMAL | Status: AC
Start: 2015-06-04 — End: ?

## 2015-06-04 MED ORDER — ALPRAZOLAM 1 MG PO TABS: 1.0000 mg | ORAL_TABLET | Freq: Four times a day (QID) | ORAL | Status: AC | PRN

## 2015-06-04 NOTE — Progress Notes (Signed)
Patient does not have living will.  Former smoker. 

## 2015-06-05 LAB — URINE CULTURE

## 2015-06-06 ENCOUNTER — Encounter: Payer: Self-pay | Admitting: Oncology

## 2015-06-06 NOTE — Progress Notes (Signed)
[place] @ [facility] [phone]  Fax:([phone] [place]: 1935-11-23  MR#: [id number]  FUX#:[id number]  Patient Care Team: Forest Gleason, MD as PCP - General (Oncology)  CHIEF COMPLAINT:  Chief Complaint  Patient presents with  . 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  Patient was started on carboplatinum and gemcitabine for progressive ovarian cancer Due to poor tolerance to chemotherapy patient is now on a Avastin (December, 2015) 10/2014      Patient started on Taxol and Avastin June, 2016 Because of multiple psychiatric issue patient has been off chemotherapy   August of 2016 Patient has a rising tumor markers increasing abdominal pain.  At the request of the patient and family patient has been referred to hospice care for end-of-life care September, 2016 Patient is here for ongoing evaluation and Ms. Complain of pain.  Urine for the drug screen does not reveal any opiates.  Question whether she is getting regular oxycodone or not f. Family situation is difficult to evaluate. Patient as also to depress emotional anxious difficult to evaluate how much is pain on how much his anxiety. No nausea.  No vomiting.  No diarrhea.  Appetite is poor.   Oncology Flowsheet 01/31/2015 02/01/2015 02/01/2015 02/01/2015 02/02/2015 02/02/2015 05/21/2015  ALPRAZolam (XANAX) PO 1 mg 1 mg 1 mg 1 mg 1 mg 1 mg -  dexamethasone (DECADRON) IV - - - - - - 10 mg  LORazepam (ATIVAN) IV - - - - - - -  OLANZapine (ZYPREXA) PO - - - - - - -    INTERVAL HISTORY:  79 year old lady with locally advanced carcinoma 40 patient had been admitted in the rehabilitation and went  through multiple changes in the medication.  Patient continues to have increasing abdominal pain and discomfort but less confused.  Appetite has been poor.  Has lost significant weight.  Patient is here for ongoing evaluation regarding ovarian cancer patient has been off chemotherapy for last more than 6 weeks causal significant problems associated with depression and medication. April 16, 2015 Patient is here for ongoing evaluation regarding recurrent ovarian cancer patient fell and is hurting in both knees.  Walking with the help of walker.  Abdominal pain is improved.  Appetite has been stable.  No nausea no vomiting no diarrhea.  Taking pain medication very frequently. April 27, 2015 Patient continues to have pain in both knees.  Went to emergency room.  Had swelling of both lower extremity.  Ultrasound of both lower extremity was done and there was no evidence of blood clot.  Plain x-ray revealed fracture of patella.  Patient received Dilaudid for pain Patient's condition continues to decline as a rising CA 125 Poor appetite Extremely depressed May 14, 2015 Patient and family here for further evaluation and treatment consideration. Appetite is improved.  Abdominal discomfort is better. The patient had a fracture in the knee which continues to bother patient. May 28, 2015 Patient  and family came today for the follow-up since last evaluation patient having multiple issues.  There is also problem associated with family dynamics.  The patient has increasing abdominal pain.  Continues to have aches and pains all over the body.  Patient is extremely depressed. A few days ago early morning patient and family decided to call hospice.  As a rising CA-125.  Increasing abdominal distention.  Expand All Collapse All   Patient does not have living will. Former smoker. Patient states she wants to be left alone. States her family is good to her but she is tired and does not feel good and sometimes they  get on her nerves when they all come at one time. Patient states she is having pain in her arms and shoulders - not sure if she is having muscle or bone pain. Patient further states she feels like she has another UTI - urinary frequency, burning, small amounts of urine    Patient's social situation is very difficult  REVIEW OF SYSTEMS:    Gen. status: Patient is alert oriented somewhat depressed Increasing problems with family dynamics  HEENT: No headache.  No soreness in the mouth Cardiac: No chest pain or paroxysmal nocturnal dyspnea Lungs: Shortness of breath on exertion GI: Persistent nausea.  Increasing abdominal pain.  Pain is in the left lower quadrant. Increasing pain mainly in the right lower quadrant  Lower extremity swelling of both lower extremity  Neuro: Continues to have depression.  Dizziness.  No other localizing symptom Psychiatric system: Severe depression being managed by psychiatrist Musculoskeletal system: Diffuse bony pain and muscle pain  GU: Has dysuria and burning  As per HPI. Otherwise, a complete review of systems is negatve.  PAST MEDICAL HISTORY: Past Medical History  Diagnosis Date  . Peritoneal carcinomatosis 01/15/2015  . Ovarian cancer   . Diabetes mellitus without complication   . Anemia   . GERD (gastroesophageal reflux disease)   . Irregular cardiac rhythm   . Chronic back pain   . Depression     PAST SURGICAL HISTORY: Past Surgical History  Procedure Laterality Date  . Abdominal hysterectomy    . Appendectomy    . Cholecystectomy    . Heel spur surgery    . Tubal ligation       ADVANCED DIRECTIVES:  Patient does not have any living will or healthcare power of attorney.  Information was given .  Available resources had been discussed.  We will follow-up on subsequent appointments regarding this issue HEALTH MAINTENANCE: Social History  Substance Use Topics  . Smoking status: Former Research scientist (life sciences)  . Smokeless tobacco: None  . Alcohol Use:  No      Allergies  Allergen Reactions  . Macrodantin [Nitrofurantoin] Hives and Itching  . Morphine And Related Other (See Comments)    Ineffective for pain   . Nitrofurantoin Monohyd Macro Itching and Hives  . Sulfa Antibiotics Rash  . Tequin [Gatifloxacin] Other (See Comments), Rash and Nausea And Vomiting    Patient does not know Unknown reaction    Current Outpatient Prescriptions  Medication Sig Dispense Refill  . amLODipine (NORVASC) 10 MG tablet Take 1 tablet (10 mg total) by mouth daily. 30 tablet 0  . cephALEXin (KEFLEX) 500 MG capsule Take 1 capsule (500 mg total) by mouth 3 (three) times daily. 21 capsule 0  . ciprofloxacin (CIPRO) 500 MG tablet Take 1 tablet (500 mg total) by mouth 2 (two) times daily. 14 tablet 0  . FeFum-FePoly-FA-B Cmp-C-Biot (INTEGRA PLUS) CAPS  Take by mouth daily.    . fentaNYL (DURAGESIC - DOSED MCG/HR) 100 MCG/HR Place 1 patch (100 mcg total) onto the skin every 3 (three) days. 10 patch 0  . furosemide (LASIX) 40 MG tablet Take 1 tablet (40 mg total) by mouth daily. 30 tablet 0  . hydrochlorothiazide (HYDRODIURIL) 25 MG tablet TAKE ONE TABLET BY MOUTH EVERY DAY 30 tablet 2  . hydrocortisone (ANUSOL-HC) 25 MG suppository Place 1 suppository (25 mg total) rectally 2 (two) times daily. 12 suppository 2  . lisinopril (PRINIVIL,ZESTRIL) 40 MG tablet TAKE ONE TABLET BY MOUTH EVERY DAY 30 tablet 1  . meloxicam (MOBIC) 15 MG tablet Take 1 tablet (15 mg total) by mouth daily. 30 tablet 3  . omeprazole (PRILOSEC OTC) 20 MG tablet Take 20 mg by mouth 2 (two) times daily.    . ondansetron (ZOFRAN) 4 MG tablet Take 4 mg by mouth every 6 (six) hours as needed for nausea or vomiting.    . pantoprazole (PROTONIX) 40 MG tablet TAKE ONE TABLET TWICE DAILY 60 tablet 3  . phenazopyridine (PYRIDIUM) 100 MG tablet Take 1 tablet (100 mg total) by mouth 2 (two) times daily with a meal. 14 tablet 0  . pindolol (VISKEN) 5 MG tablet Take 5 mg by mouth daily.     .  polyethylene glycol powder (GLYCOLAX/MIRALAX) powder TAKE 17 GRAMS ONCE A DAY 527 g 1  . PRISTIQ 50 MG 24 hr tablet TAKE ONE TABLET BY MOUTH EVERY DAY 30 tablet 3  . QUEtiapine (SEROQUEL) 50 MG tablet Take 1 tablet (50 mg total) by mouth at bedtime. 30 tablet 0  . senna-docusate (SENOKOT-S) 8.6-50 MG per tablet Take 1 tablet by mouth every 12 (twelve) hours. 30 tablet 0  . ALPRAZolam (XANAX) 1 MG tablet Take 1 tablet (1 mg total) by mouth every 6 (six) hours as needed for anxiety. HOSPICE PATIENT 60 tablet 0  . fentaNYL (DURAGESIC - DOSED MCG/HR) 25 MCG/HR patch Place 1 patch (25 mcg total) onto the skin every 3 (three) days. HOSPICE PATIENT. Use with 123mcg patch for total dose of 172mcg. 5 patch 0   No current facility-administered medications for this visit.    OBJECTIVE:  Filed Vitals:   06/04/15 1135  BP: 163/90  Pulse: 90  Temp: 97.8 F (36.6 C)     Body mass index is 21.87 kg/(m^2).    ECOG FS:2 - Symptomatic, <50% confined to bed  PHYSICAL EXAM:  GENERAL:  Well developed, well nourished, sitting comfortably in the exam room in no acute distress. MENTAL STATUS:  Alert and oriented to person, place and time. HEAD:  Normocephalic, atraumatic, face symmetric, no Cushingoid features. EYES:    Pupils equal round and reactive to light and accomodation.  No conjunctivitis or scleral icterus. ENT:  Oropharynx clear without lesion.  Tongue normal. Mucous membranes moist.  RESPIRATORY:  Clear to auscultation without rales, wheezes or rhonchi. CARDIOVASCULAR:  Regular rate and rhythm without murmur, rub or gallop. . ABDOMEN:  Tenderness in the left lower quadrant BACK:  No CVA tenderness.  No tenderness on percussion of the back or rib cage. SKIN:  No rashes, ulcers or lesions. EXTREMITIES: Swelling of both lower extremity.  Tenderness in both knee area.  LYMPH NODES: No palpable cervical, supraclavicular, axillary or inguinal adenopathy  NEUROLOGICAL: Unremarkable. PSYCH:   Appropriate.  LAB RESULTS: Appointment on 06/03/2015  Component Date Value Ref Range Status  . Color, Urine 06/03/2015 COLORLESS* YELLOW Final  . APPearance 06/03/2015 CLEAR* CLEAR Final  .  Glucose, UA 06/03/2015 NEGATIVE  NEGATIVE mg/dL Final  . Bilirubin Urine 06/03/2015 NEGATIVE  NEGATIVE Final  . Ketones, ur 06/03/2015 NEGATIVE  NEGATIVE mg/dL Final  . Specific Gravity, Urine 06/03/2015 1.008  1.005 - 1.030 Final  . Hgb urine dipstick 06/03/2015 NEGATIVE  NEGATIVE Final  . pH 06/03/2015 5.0  5.0 - 8.0 Final  . Protein, ur 06/03/2015 NEGATIVE  NEGATIVE mg/dL Final  . Nitrite 06/03/2015 NEGATIVE  NEGATIVE Final  . Leukocytes, UA 06/03/2015 NEGATIVE  NEGATIVE Final  . RBC / HPF 06/03/2015 NONE SEEN  0 - 5 RBC/hpf Final  . WBC, UA 06/03/2015 0-5  0 - 5 WBC/hpf Final  . Bacteria, UA 06/03/2015 NONE SEEN  NONE SEEN Final  . Squamous Epithelial / LPF 06/03/2015 0-5* NONE SEEN Final  . Mucous 06/03/2015 PRESENT   Final  . Specimen Description 06/03/2015 URINE, CLEAN CATCH   Final  . Special Requests 06/03/2015 URINE CLEAN CATCH   Final  . Culture 06/03/2015 MULTIPLE SPECIES PRESENT, SUGGEST RECOLLECTION   Final  . Report Status 06/03/2015 06/05/2015 FINAL   Final  . Tricyclic, Ur Screen [id number] NONE DETECTED  NONE DETECTED Final  . Amphetamines, Ur Screen 06/03/2015 NONE DETECTED  NONE DETECTED Final  . MDMA (Ecstasy)Ur Screen 06/03/2015 NONE DETECTED  NONE DETECTED Final  . Cocaine Metabolite,Ur [place] 06/03/2015 NONE DETECTED  NONE DETECTED Final  . Opiate, Ur Screen 06/03/2015 NONE DETECTED  NONE DETECTED Final  . Phencyclidine (PCP) Ur S 06/03/2015 NONE DETECTED  NONE DETECTED Final  . Cannabinoid 50 Ng, Ur [place] 06/03/2015 NONE DETECTED  NONE DETECTED Final  . Barbiturates, Ur Screen 06/03/2015 NONE DETECTED  NONE DETECTED Final  . Benzodiazepine, Ur Scrn 06/03/2015 POSITIVE* NONE DETECTED Final  . Methadone Scn, Ur 06/03/2015 NONE DETECTED  NONE DETECTED Final   Comment: (NOTE) 193   Tricyclics, urine               Cutoff 1000 ng/mL 200  Amphetamines, urine             Cutoff 1000 ng/mL 300  MDMA (Ecstasy), urine           Cutoff 500 ng/mL 400  Cocaine Metabolite, urine       Cutoff 300 ng/mL 500  Opiate, urine                   Cutoff 300 ng/mL 600  Phencyclidine (PCP), urine      Cutoff 25 ng/mL 700  Cannabinoid, urine              Cutoff 50 ng/mL 800  Barbiturates, urine             Cutoff 200 ng/mL 900  Benzodiazepine, urine           Cutoff 200 ng/mL 1000 Methadone, urine                Cutoff 300 ng/mL 1100 1200 The urine drug screen provides only a preliminary, unconfirmed 1300 analytical test result and should not be used for non-medical 1400 purposes. Clinical consideration and professional judgment should 1500 be applied to any positive drug screen result due to possible 1600 interfering substances. A more specific alternate chemical method 1700 must be used in order to obtain a confirmed analytical result.  1800 Gas chromato                          graphy / mass spectrometry (GC/MS) is the preferred 1900  confirmatory method.     ASSESSMENT: Ovarian cancer is stage IV recurrent disease. depression Progressive increase in tumor marker will discuss with family regarding options of treatment Progressive swelling of both lower extremity MEDICAL DECISION MAKING:  Recurrent ovarian cancer progressing disease Increasing abdominal discomfort and pain Severe depression Poor social situation I had prolonged discussion with patient and family. Patient may need to [facility] for control of pain CAD  PUMP needed to control pain medication given without getting into a difficult family situation PATIENT MAY NEED TO GET LONG ACTING AND DIED IN HIS id MEDICATION LIKE   CLONOPIN  Total duration of visit was 45  minutes.  50% or more time was spent in counseling patient and family regarding prognosis and options of treatment and available  resources SORE DISCUSSION WITH HOSPICE IN [place] CONSIDERED CONTROL OF PAIN  Ovarian cancer   Staging form: Ovary, AJCC 7th Edition     Clinical stage from 05/16/2013: Stage IIIC (yT3c, N1, M0) - Signed by Evlyn Kanner, NP on 01/15/2015   Forest Gleason, MD   06/06/2015 9:52 AM

## 2015-06-08 ENCOUNTER — Telehealth: Payer: Self-pay | Admitting: *Deleted

## 2015-06-08 NOTE — Telephone Encounter (Signed)
Jessica Avery agreed to [facility] and was taken there around noon by daughter [name]

## 2015-06-09 ENCOUNTER — Telehealth: Payer: Self-pay | Admitting: *Deleted

## 2015-06-09 NOTE — Telephone Encounter (Signed)
Informed form is ready to be picked up

## 2015-06-09 NOTE — Telephone Encounter (Signed)
Wants a DNR form

## 2015-06-11 ENCOUNTER — Other Ambulatory Visit: Payer: Medicare Other

## 2015-06-11 ENCOUNTER — Ambulatory Visit: Payer: Medicare Other | Admitting: Oncology

## 2015-06-25 ENCOUNTER — Ambulatory Visit: Payer: Medicare Other | Admitting: Oncology

## 2015-06-25 ENCOUNTER — Other Ambulatory Visit: Payer: Medicare Other

## 2015-06-27 DEATH — deceased

## 2015-08-20 IMAGING — CR DG CHEST 2V
1 series · 2 of 2 positions shown · non-contrast
Comparison: PET-CT [DATE].

CLINICAL DATA: cough, fever

EXAM:
CHEST  2 VIEW

[Series 1: dxr chest pa (or ap) and lateral · 0.14mm/px · 2 of 2 slices shown]
[im 1/2]
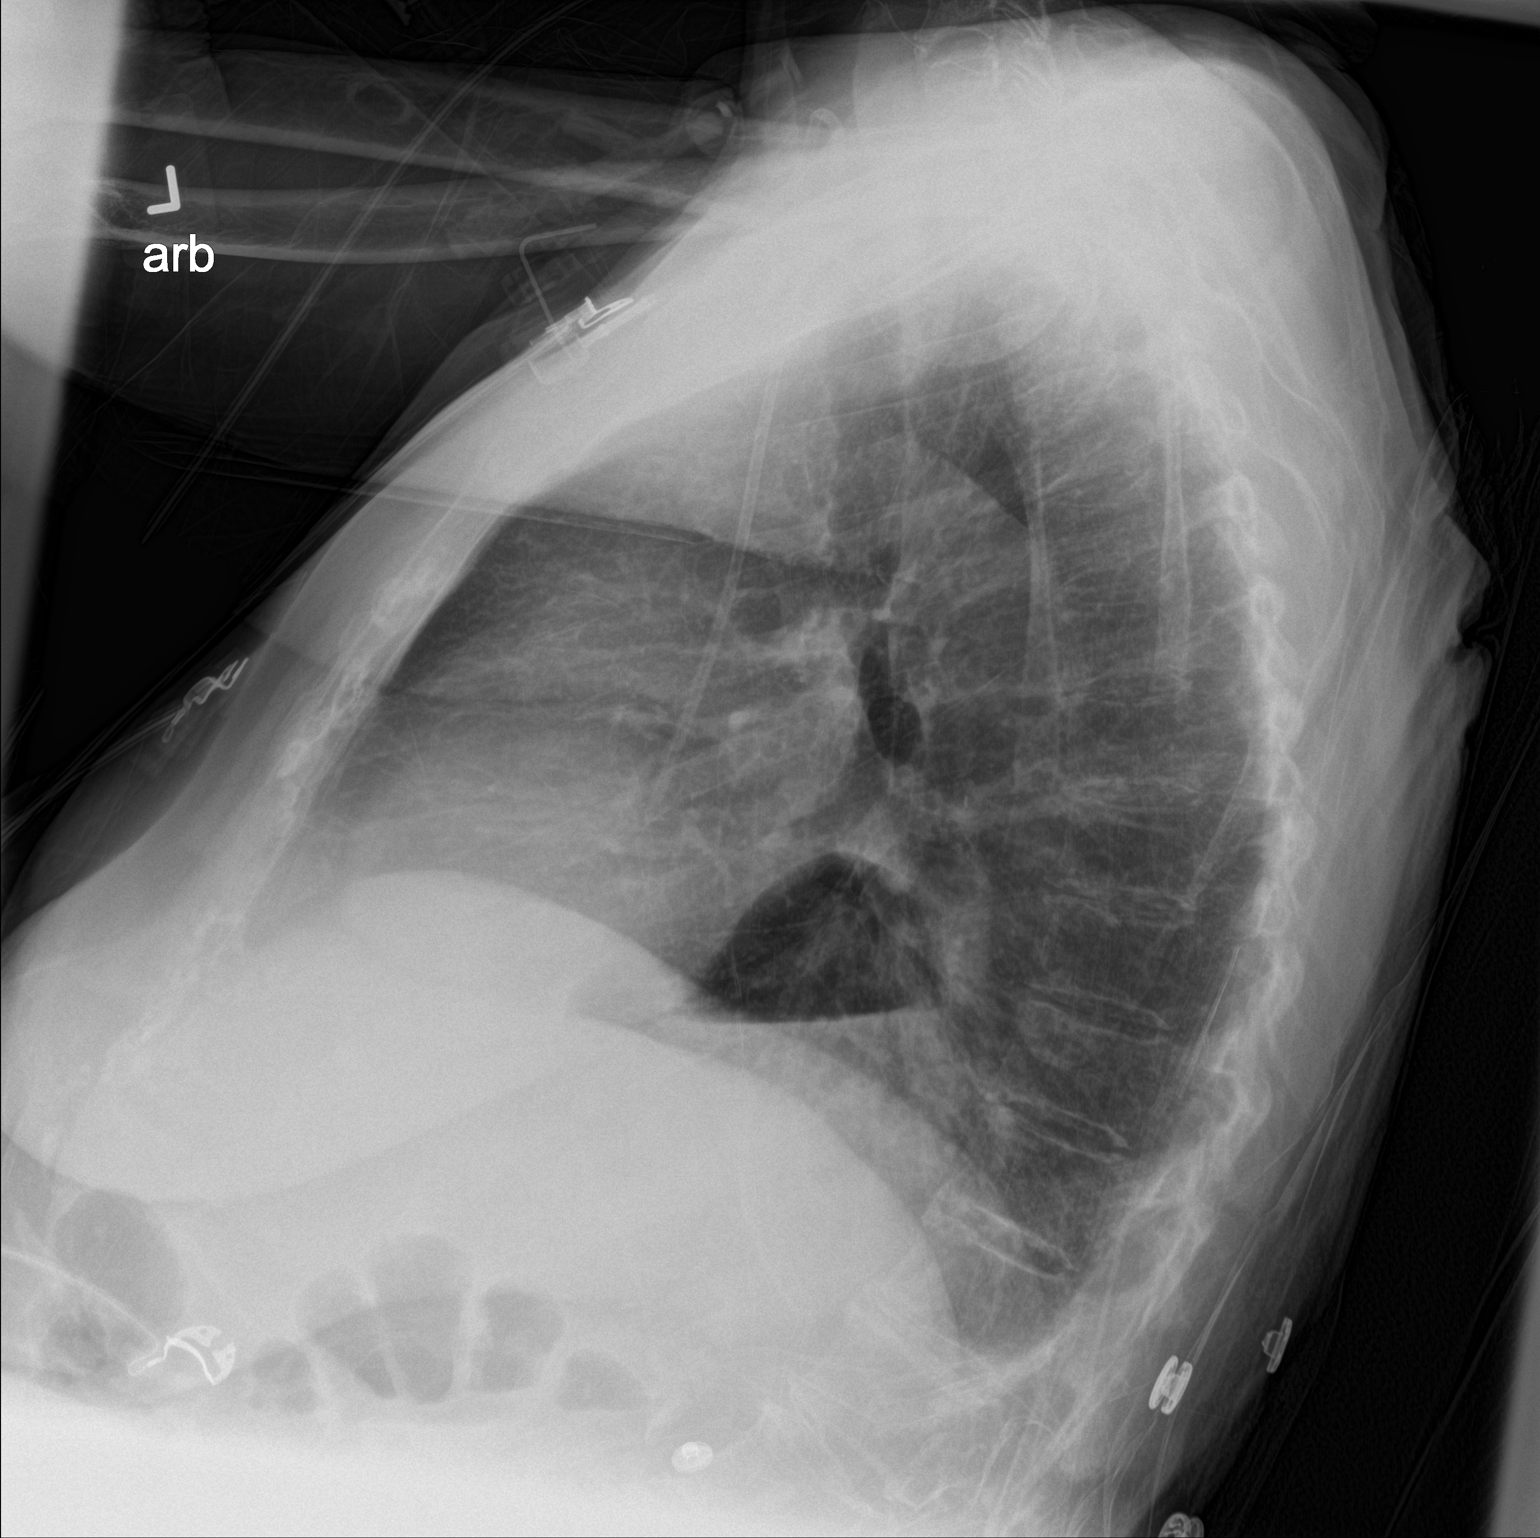
[im 2/2]
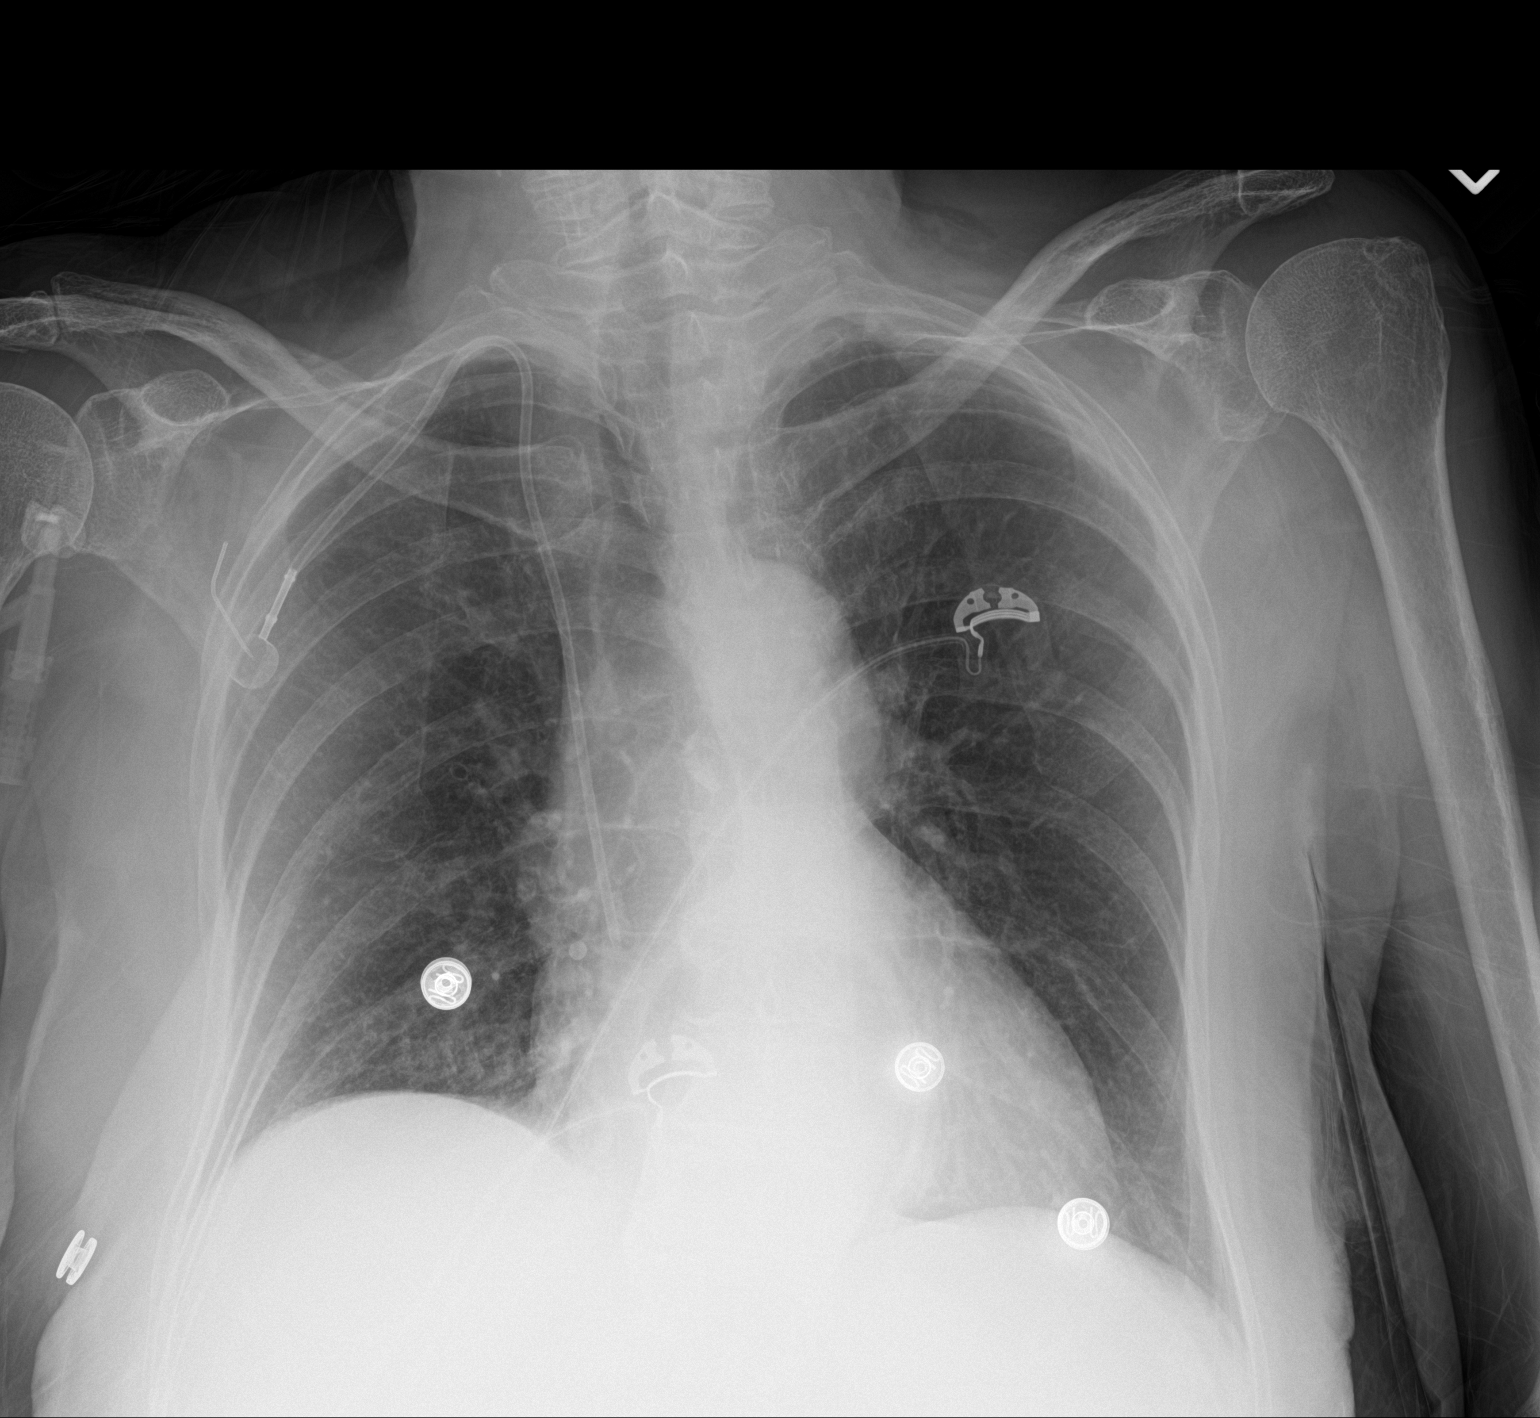

[2 of 2 positions shown; findings below may reference images not displayed]

FINDINGS: The cardiopericardial silhouette is within normal limits. Tortuous
thoracic aorta. Unchanged RIGHT IJ Port-A-Cath with the tip at the
cavoatrial junction. Buttons project over the chest. Monitoring
leads project over the chest. No airspace disease. No pleural
effusion. Hiatal hernia is present with fluid level in the
intrathoracic stomach on the lateral view.
IMPRESSION: No active cardiopulmonary disease.

## 2015-09-24 ENCOUNTER — Other Ambulatory Visit: Payer: Self-pay | Admitting: Nurse Practitioner

## 2015-11-27 IMAGING — CR DG KNEE COMPLETE 4+V*R*
1 series · 4 of 4 positions shown · non-contrast
Comparison: None.

CLINICAL DATA: Fall onto right knee 2 weeks ago. Persistent knee
pain and swelling. Initial encounter.

EXAM:
RIGHT KNEE - COMPLETE 4+ VIEW

[Series 1: dg knee complete 4 views right · 0.14mm/px · 4 of 4 slices shown]
[im 1/4]
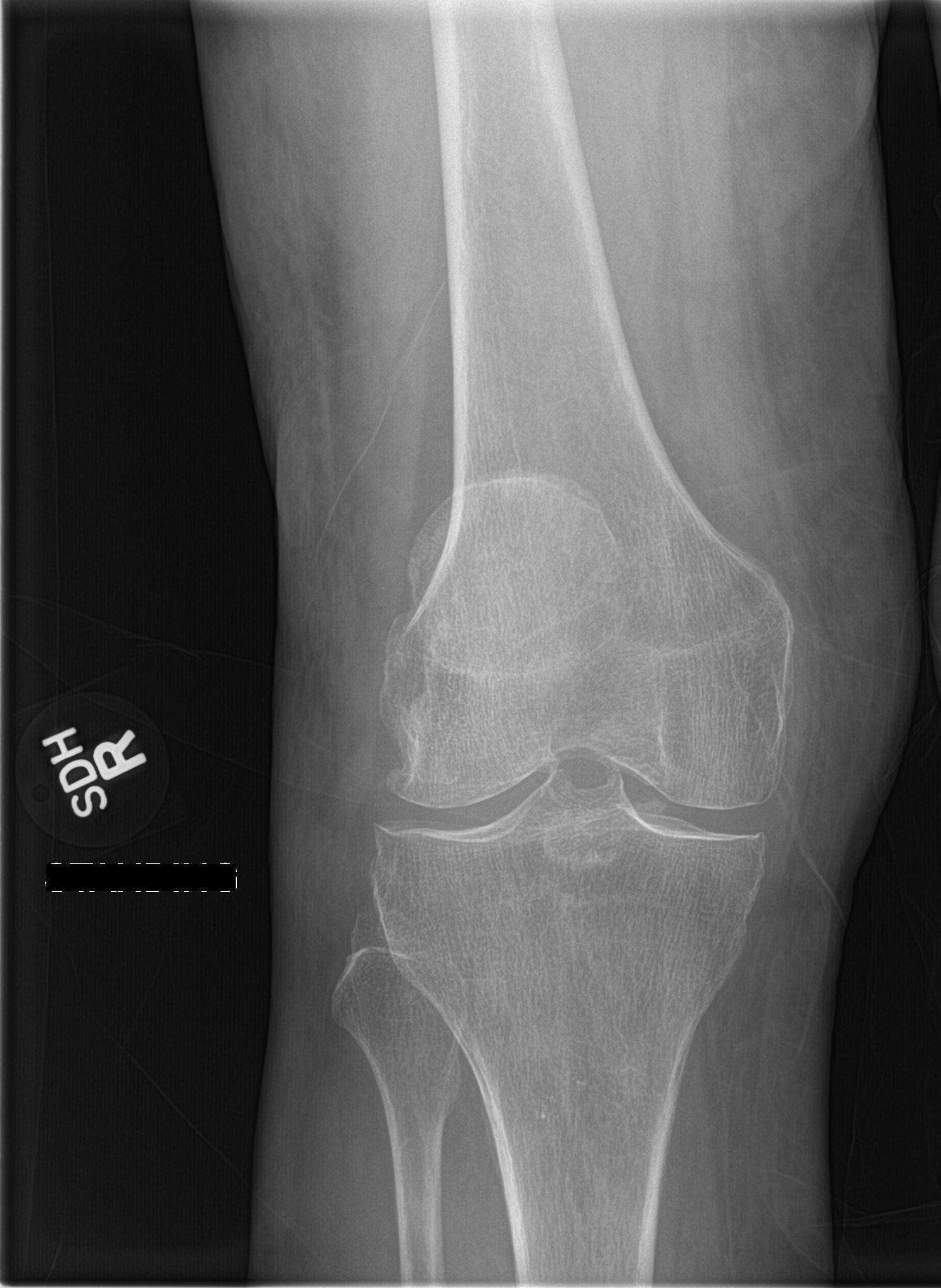
[im 2/4]
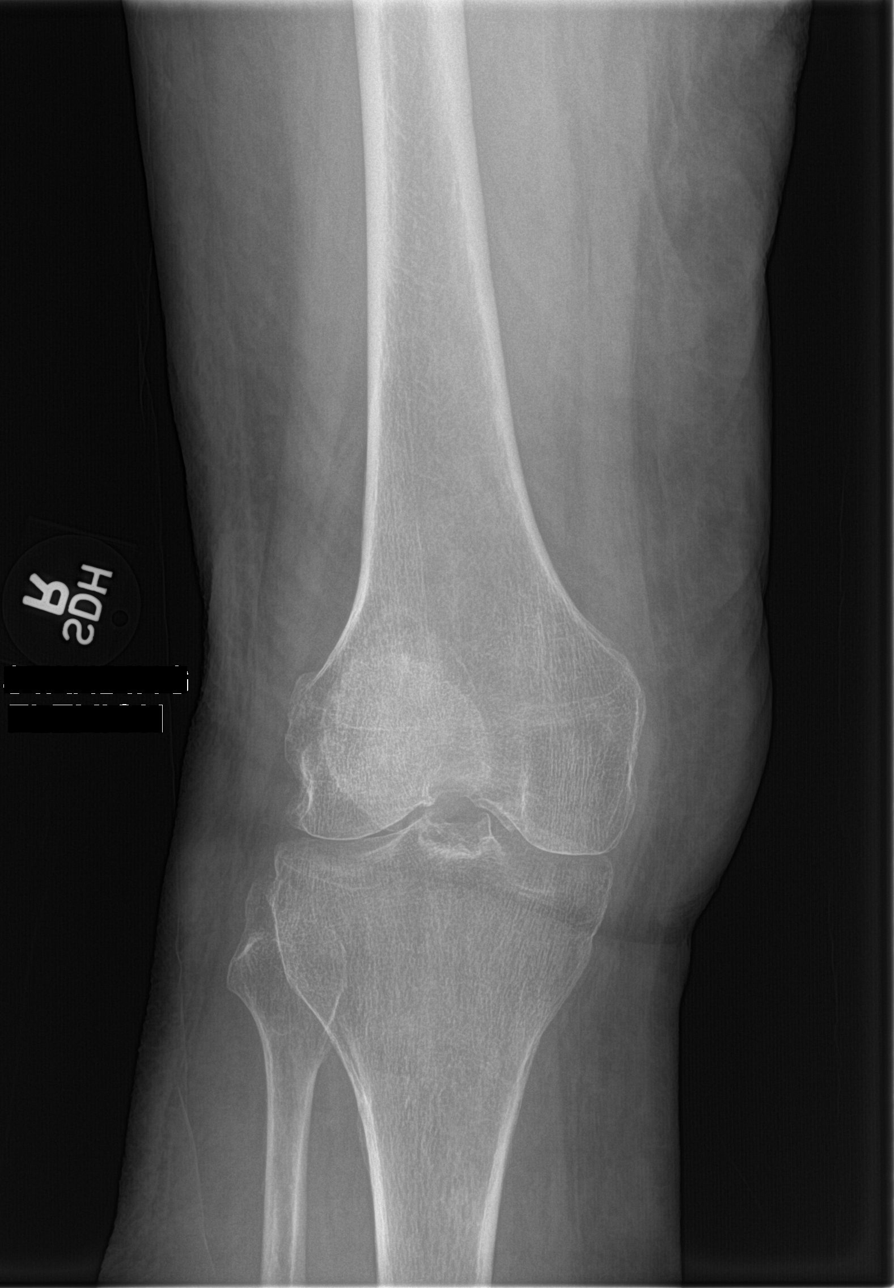
[im 3/4]
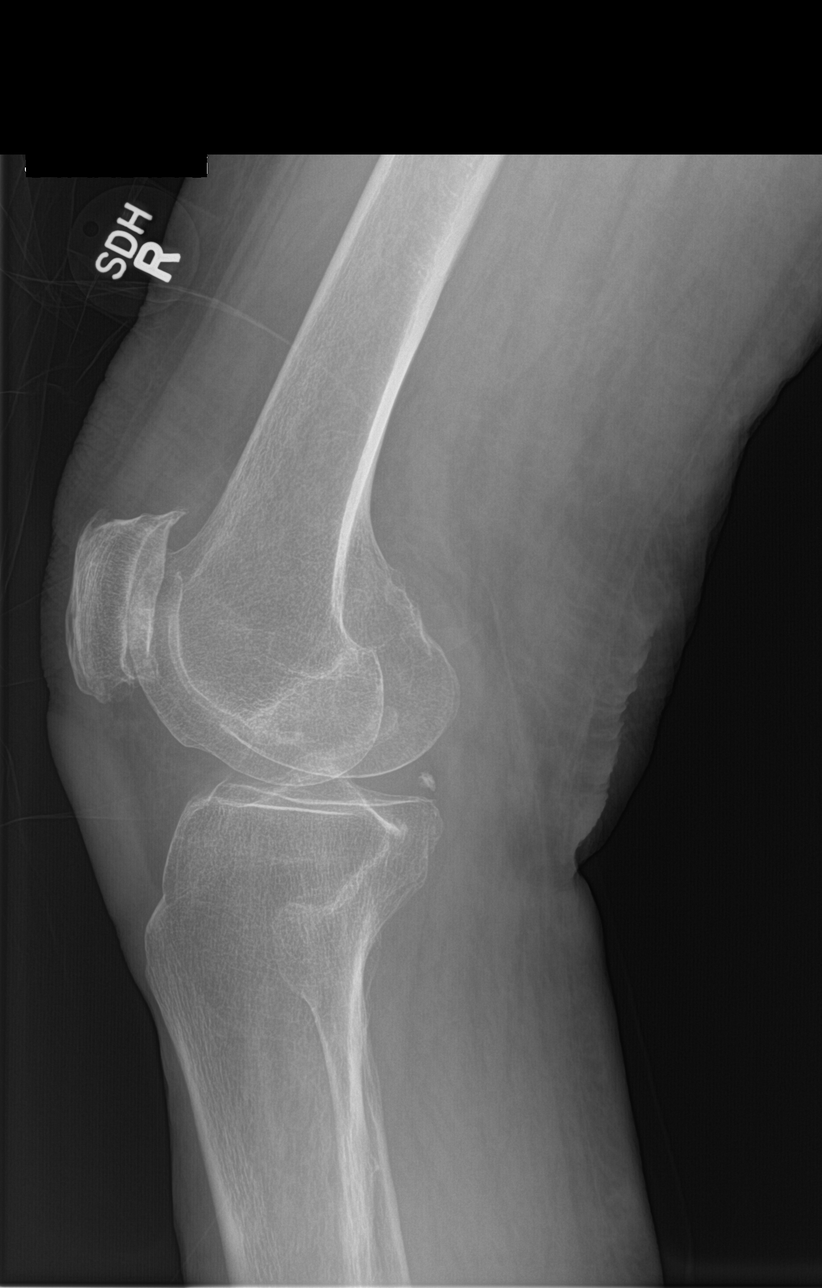
[im 4/4]
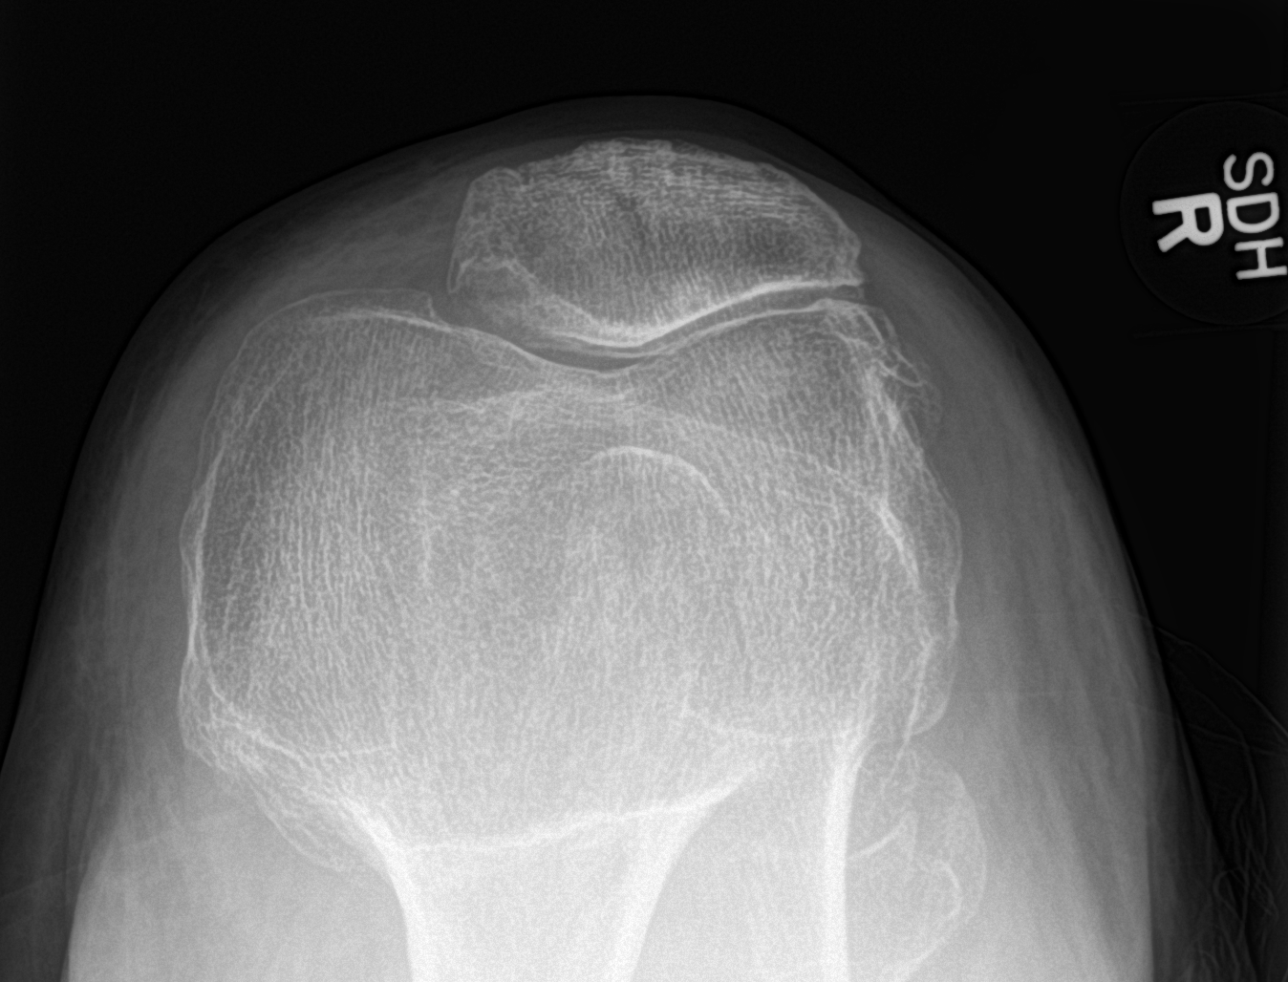

[4 of 4 positions shown; findings below may reference images not displayed]

FINDINGS: There is no evidence of fracture, dislocation, or joint effusion.
Generalized osteopenia noted. Mild to moderate patellofemoral
osteoarthritis is seen. Mild degenerative spurring is also seen
involving the medial and lateral compartments, without associated
joint space narrowing. No focal lytic or sclerotic bone lesions
identified.
IMPRESSION: No acute findings.

Osteoarthritis.  Osteopenia.

## 2015-11-27 IMAGING — CR DG CHEST 2V
1 series · 2 of 2 positions shown · non-contrast
Comparison: Two-view chest 01/17/2015

CLINICAL DATA: Fell 2 weeks ago. Progressive pain and swelling.
Weakness.

EXAM:
CHEST - 2 VIEW

[Series 1: dg chest 2 view · 0.14mm/px · 2 of 2 slices shown]
[im 1/2]
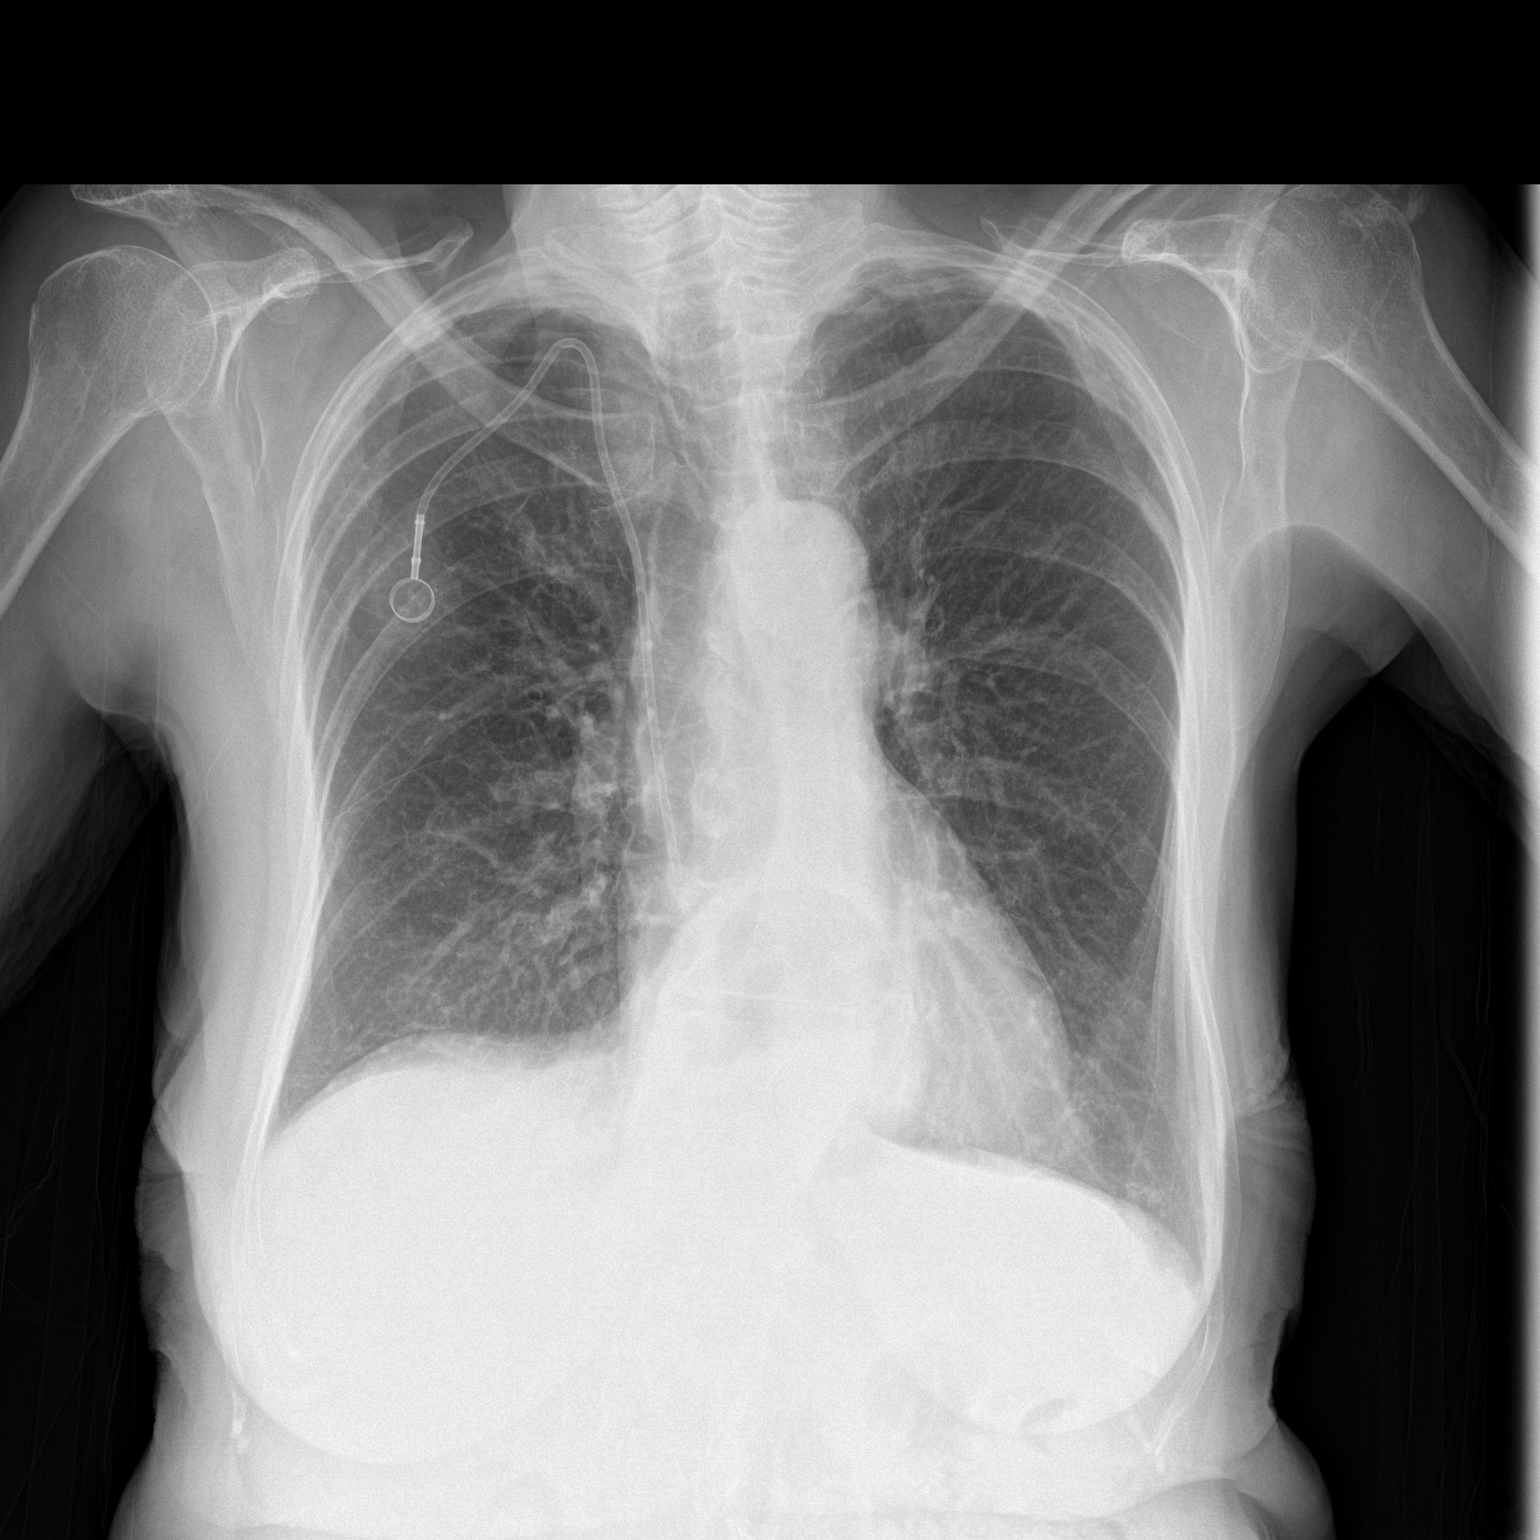
[im 2/2]
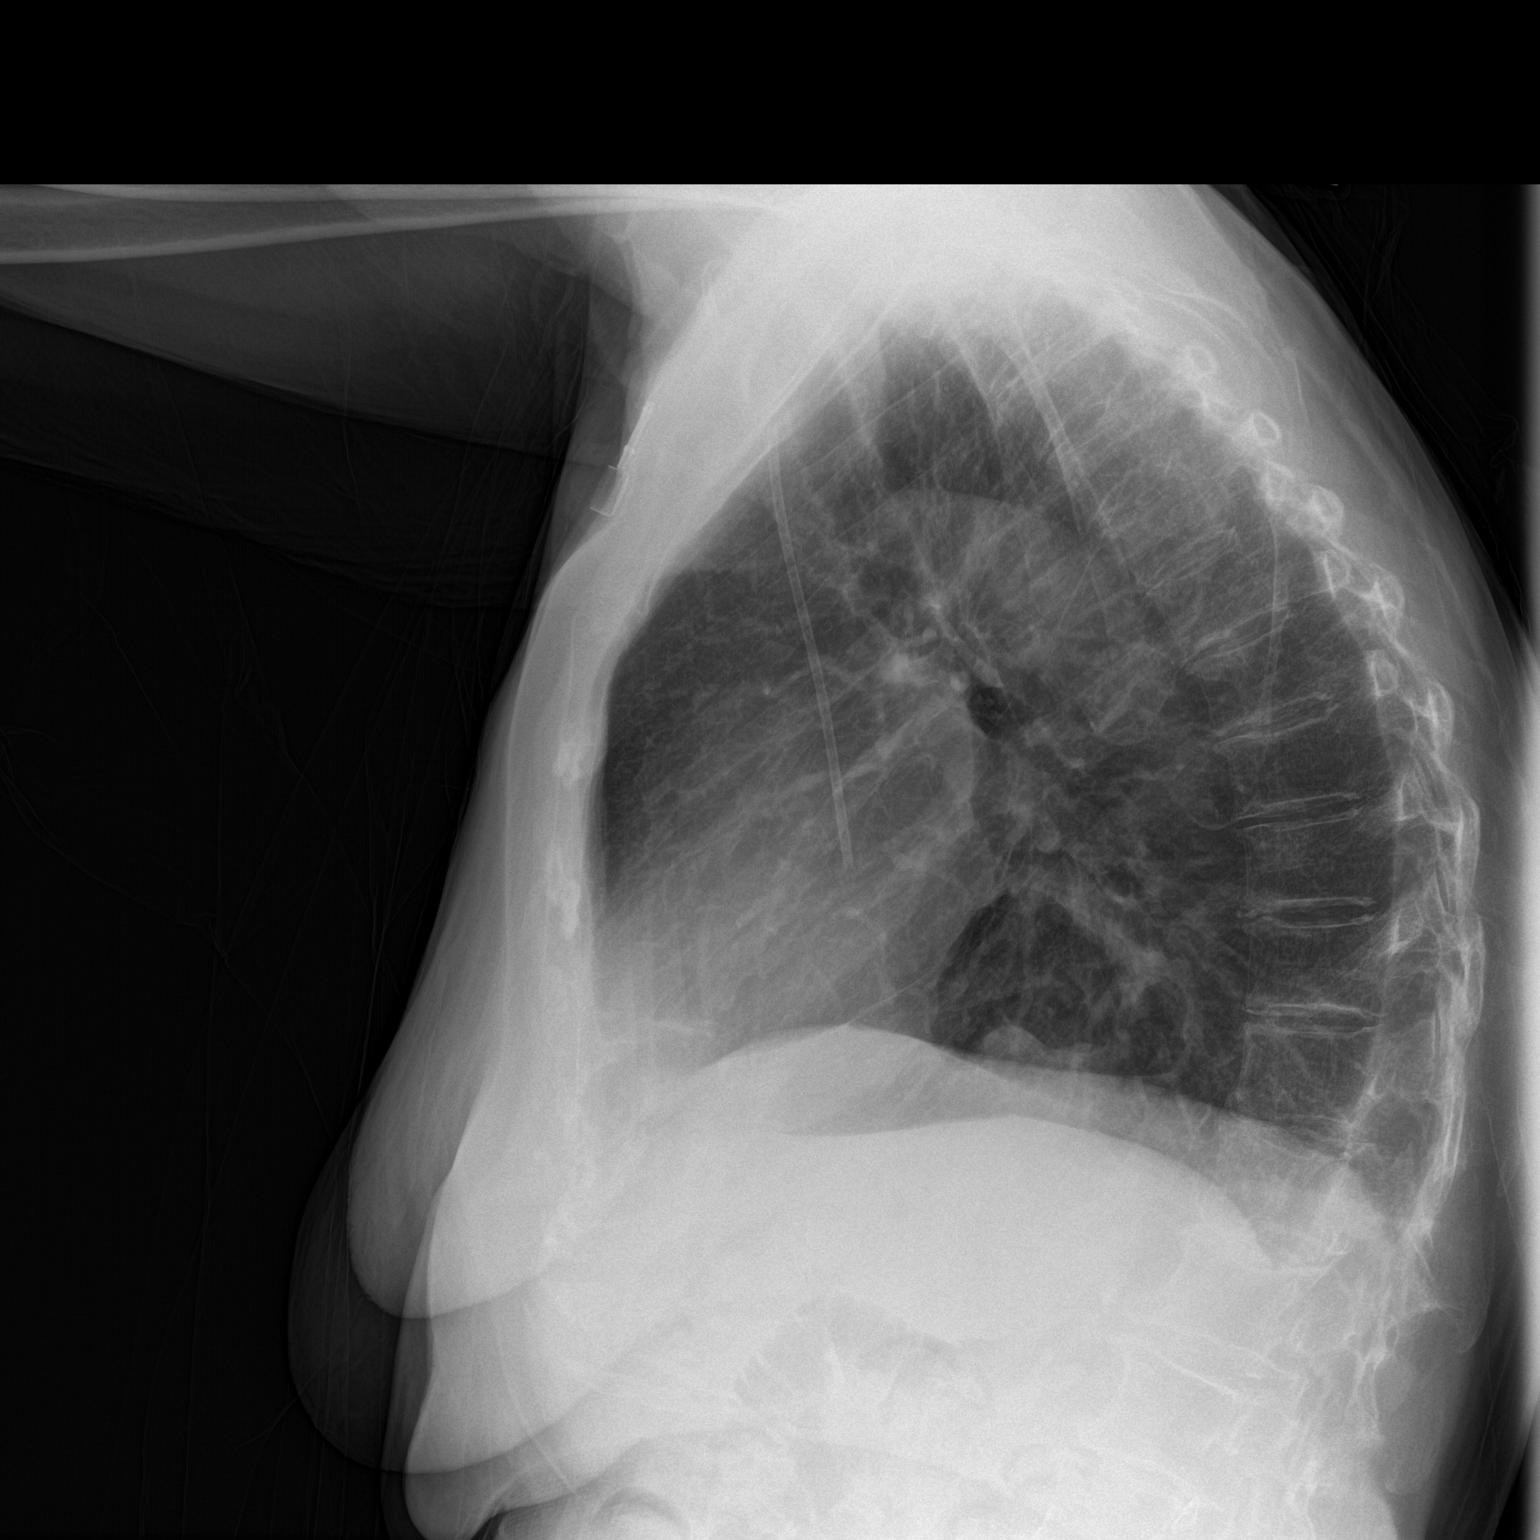

[2 of 2 positions shown; findings below may reference images not displayed]

FINDINGS: The heart size is normal. A moderate-sized hiatal hernia is present.
Emphysematous changes are noted. No significant pleural or
pericardial effusion is present. No focal airspace disease is
present. A right IJ Port-A-Cath is stable. Leftward curvature of the
thoracic spine is stable. Visualized soft tissues and bony thorax
are otherwise unremarkable.
IMPRESSION: 1. No acute cardiopulmonary disease or significant interval change.
2. Emphysema.
3. Moderate-sized hiatal hernia.
4. Stable right IJ Port-A-Cath.

## 2016-09-20 IMAGING — US US EXTREM LOW VENOUS BILAT
1 series · 13 of 24 positions shown · non-contrast
Comparison: WITH:
COMPARISON: WITH
None.

CLINICAL DATA: Fall 2 weeks ago. Bilateral lower extremity pain and
swelling.



[Series 1: us extrem low venous bilat · 0.07mm/px · 13 of 63 slices shown]
[im 1/63]
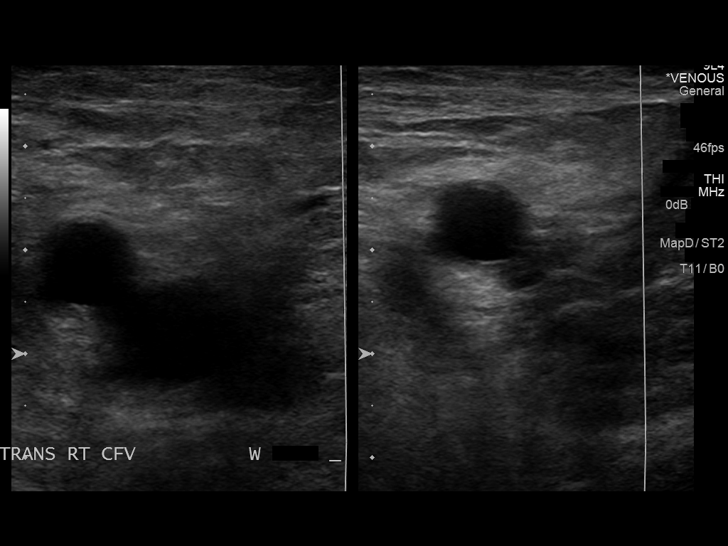
[im 6/63]
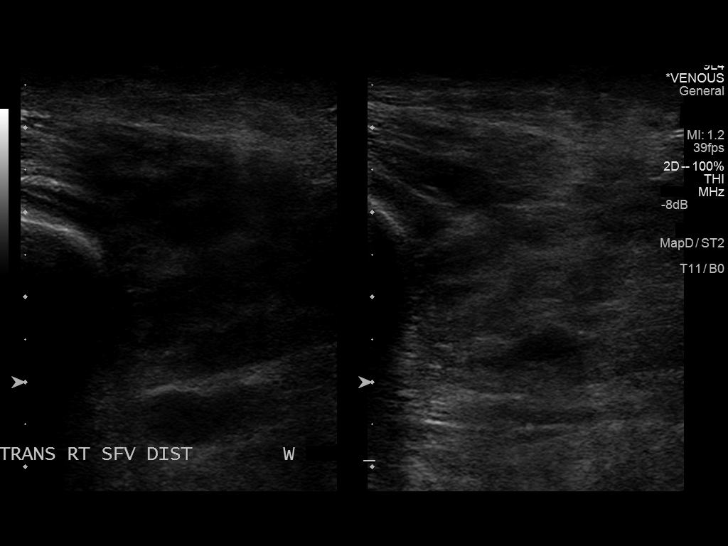
[im 11/63]
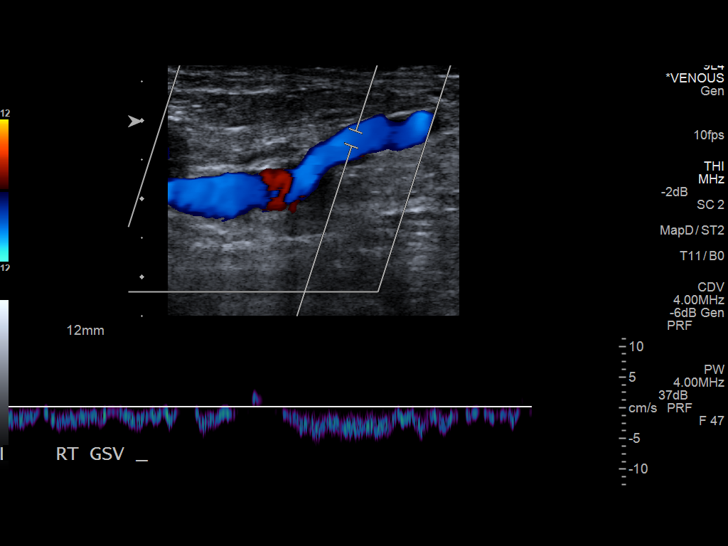
[im 17/63]
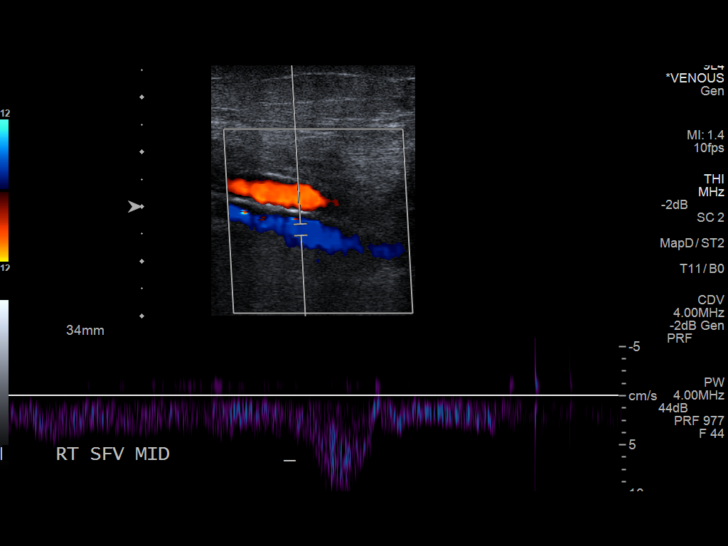
[im 22/63]
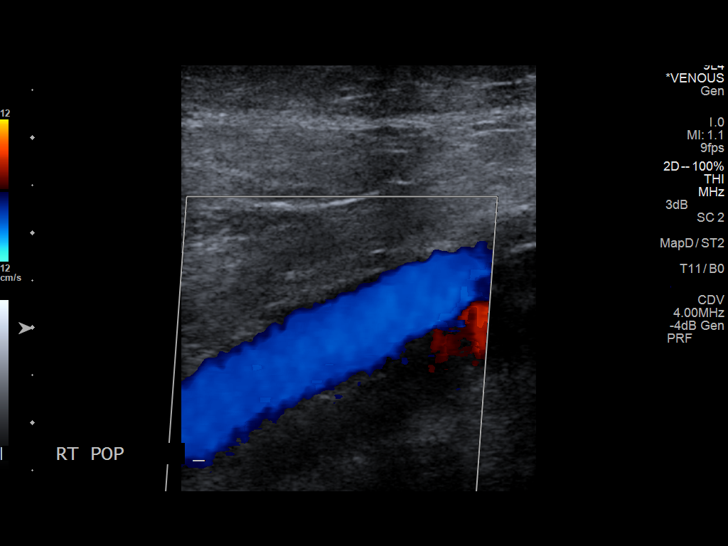
[im 27/63]
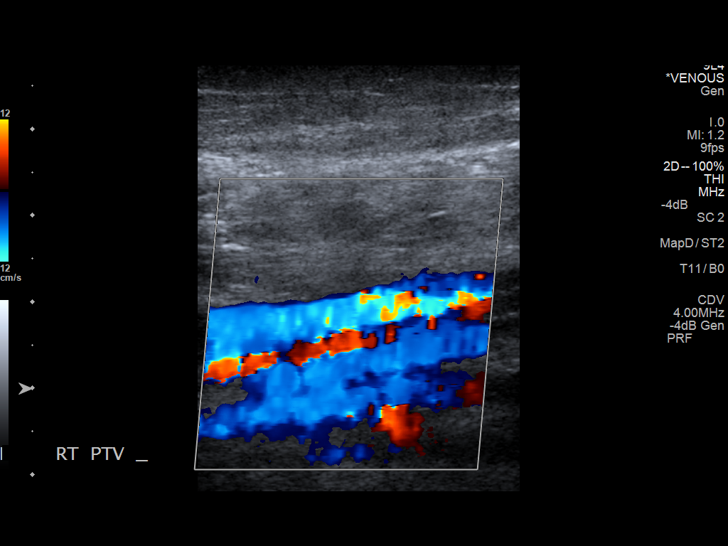
[im 33/63]
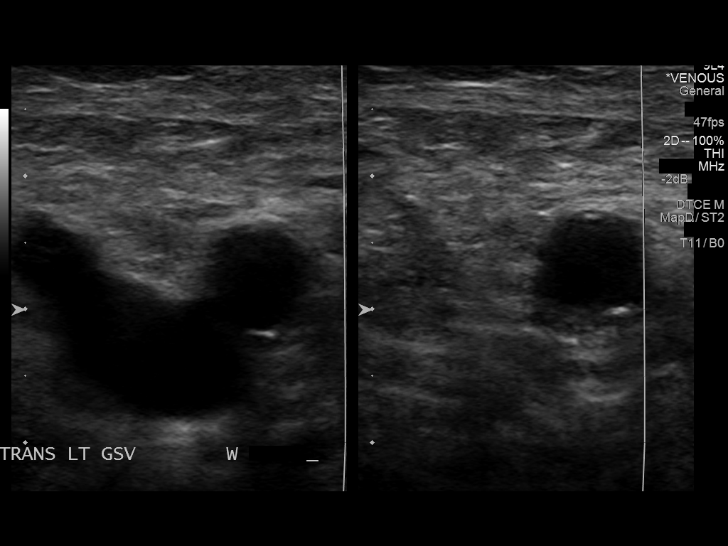
[im 36/63]
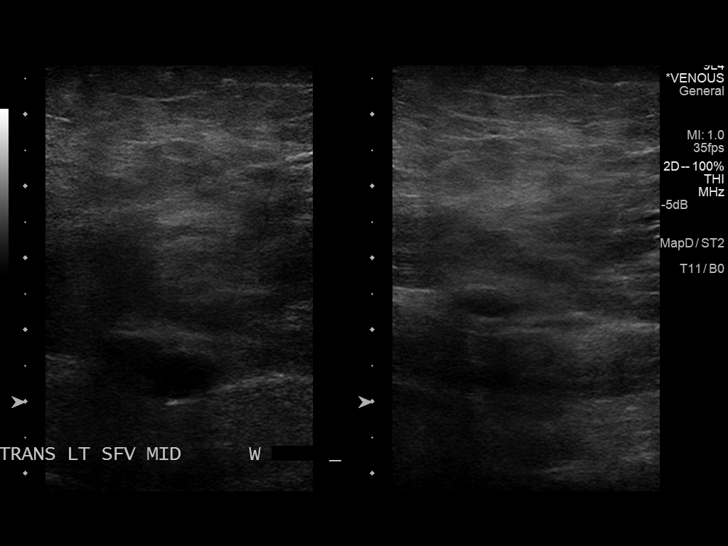
[im 41/63]
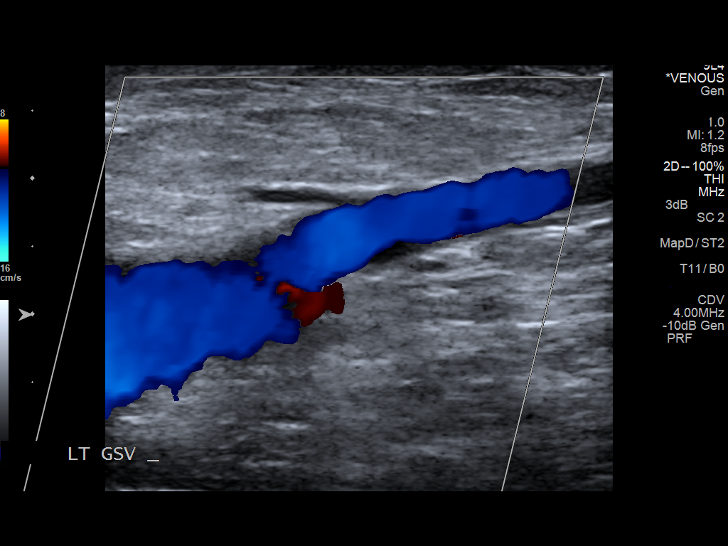
[im 46/63]
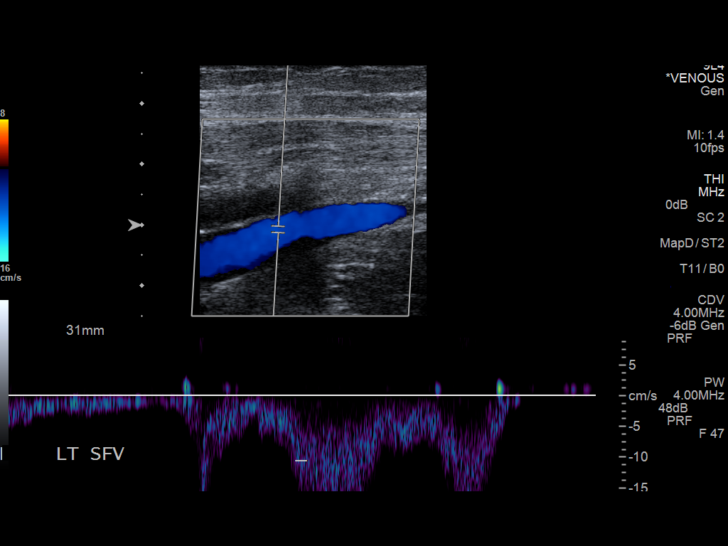
[im 52/63]
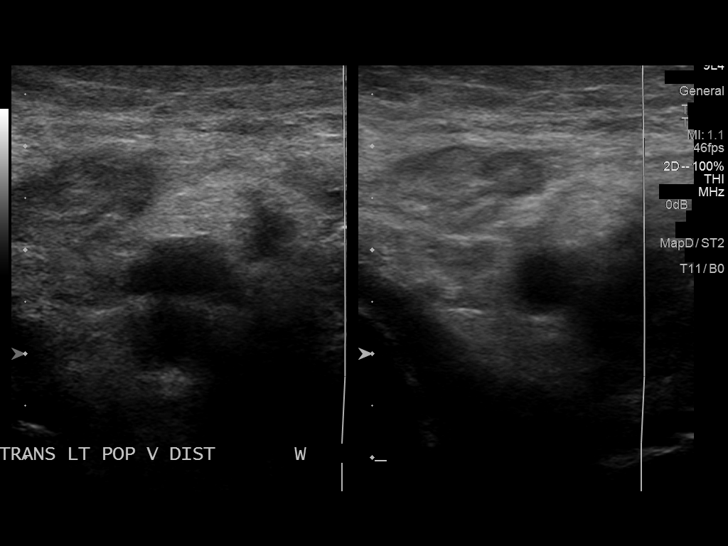
[im 57/63]
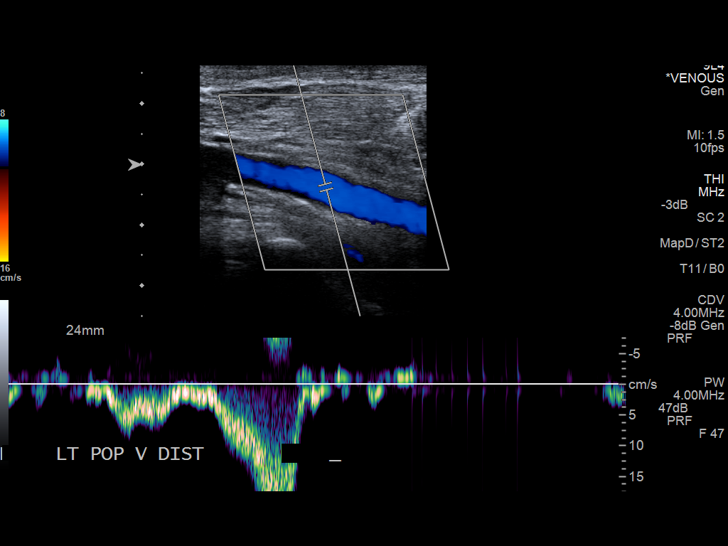
[im 63/63]
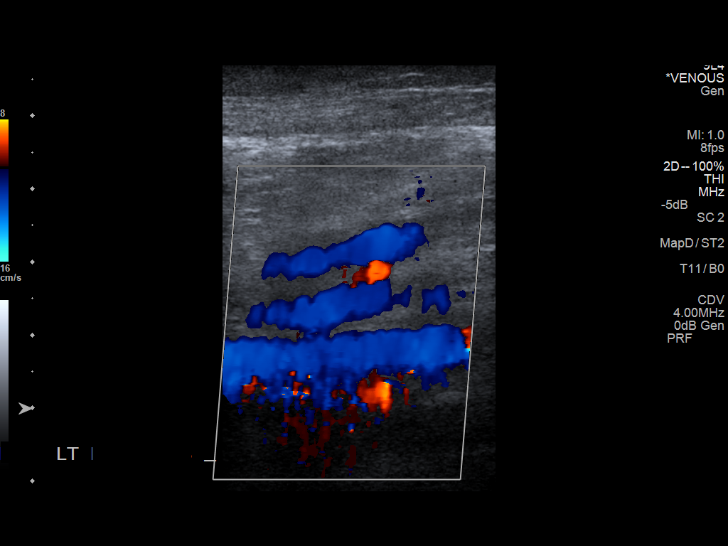

[13 of 24 positions shown; findings below may reference images not displayed]

FINDINGS: RIGHT LOWER EXTREMITY

Common Femoral Vein: No evidence of thrombus. Normal
compressibility, respiratory phasicity and response to augmentation.

Saphenofemoral Junction: No evidence of thrombus. Normal
compressibility and flow on color Doppler imaging.

Profunda Femoral Vein: No evidence of thrombus. Normal
compressibility and flow on color Doppler imaging.

Femoral Vein: No evidence of thrombus. Normal compressibility,
respiratory phasicity and response to augmentation.

Popliteal Vein: No evidence of thrombus. Normal compressibility,
respiratory phasicity and response to augmentation.

Calf Veins: No evidence of thrombus. Normal compressibility and flow
on color Doppler imaging.

Superficial Great Saphenous Vein: No evidence of thrombus. Normal
compressibility and flow on color Doppler imaging.

LEFT LOWER EXTREMITY

Common Femoral Vein: No evidence of thrombus. Normal
compressibility, respiratory phasicity and response to augmentation.

Saphenofemoral Junction: No evidence of thrombus. Normal
compressibility and flow on color Doppler imaging.

Profunda Femoral Vein: No evidence of thrombus. Normal
compressibility and flow on color Doppler imaging.

Femoral Vein: No evidence of thrombus. Normal compressibility,
respiratory phasicity and response to augmentation.

Popliteal Vein: No evidence of thrombus. Normal compressibility,
respiratory phasicity and response to augmentation.

Calf Veins: No evidence of thrombus. Normal compressibility and flow
on color Doppler imaging.

Superficial Great Saphenous Vein: No evidence of thrombus. Normal
compressibility and flow on color Doppler imaging.
IMPRESSION: No evidence of deep venous thrombosis. Bilateral lower extremity
evaluation.
